# Patient Record
Sex: Female | Born: 1980
Health system: Southern US, Community
[De-identification: ages and names within clinical notes are randomized; demographics above are authoritative.]

## PROBLEM LIST (undated history)

## (undated) DIAGNOSIS — E7800 Pure hypercholesterolemia, unspecified: Secondary | ICD-10-CM

## (undated) DIAGNOSIS — I10 Essential (primary) hypertension: Secondary | ICD-10-CM

## (undated) DIAGNOSIS — F32A Depression, unspecified: Secondary | ICD-10-CM

## (undated) DIAGNOSIS — N751 Abscess of Bartholin's gland: Secondary | ICD-10-CM

## (undated) DIAGNOSIS — K859 Acute pancreatitis without necrosis or infection, unspecified: Secondary | ICD-10-CM

## (undated) HISTORY — PX: KNEE SURGERY: SHX244

## (undated) HISTORY — DX: Abscess of Bartholin's gland: N75.1

## (undated) HISTORY — DX: Depression, unspecified: F32.A

---

## 2007-08-30 ENCOUNTER — Ambulatory Visit: Payer: Self-pay | Admitting: Family Medicine

## 2007-08-30 LAB — CONVERTED CEMR LAB
AST: 21 units/L (ref 0–37)
Albumin: 4.8 g/dL (ref 3.5–5.2)
Alkaline Phosphatase: 52 units/L (ref 39–117)
Chloride: 103 meq/L (ref 96–112)
Eosinophils Absolute: 0.1 10*3/uL (ref 0.0–0.7)
Glucose, Bld: 173 mg/dL — ABNORMAL HIGH (ref 70–99)
LDL Cholesterol: 82 mg/dL (ref 0–99)
Lymphs Abs: 2.8 10*3/uL (ref 0.7–3.3)
MCV: 86.3 fL (ref 78.0–100.0)
Microalb, Ur: 35.1 mg/dL — ABNORMAL HIGH (ref 0.00–1.89)
Neutro Abs: 6.6 10*3/uL (ref 1.7–7.7)
Neutrophils Relative %: 66 % (ref 43–77)
Platelets: 300 10*3/uL (ref 150–400)
Potassium: 4.3 meq/L (ref 3.5–5.3)
Sodium: 142 meq/L (ref 135–145)
Total Protein: 7.9 g/dL (ref 6.0–8.3)
Triglycerides: 400 mg/dL — ABNORMAL HIGH (ref <150)
WBC: 10 10*3/uL (ref 4.0–10.5)

## 2007-09-27 ENCOUNTER — Ambulatory Visit: Payer: Self-pay | Admitting: *Deleted

## 2007-11-27 ENCOUNTER — Ambulatory Visit: Payer: Self-pay | Admitting: Internal Medicine

## 2007-11-27 ENCOUNTER — Encounter (INDEPENDENT_AMBULATORY_CARE_PROVIDER_SITE_OTHER): Payer: Self-pay | Admitting: Family Medicine

## 2007-11-27 LAB — CONVERTED CEMR LAB
HDL: 33 mg/dL — ABNORMAL LOW (ref >39)
Triglycerides: 700 mg/dL — ABNORMAL HIGH (ref <150)

## 2007-12-03 ENCOUNTER — Ambulatory Visit: Payer: Self-pay | Admitting: Family Medicine

## 2008-01-14 ENCOUNTER — Encounter (INDEPENDENT_AMBULATORY_CARE_PROVIDER_SITE_OTHER): Payer: Self-pay | Admitting: Family Medicine

## 2008-01-14 ENCOUNTER — Ambulatory Visit: Payer: Self-pay | Admitting: Internal Medicine

## 2008-01-14 LAB — CONVERTED CEMR LAB
Total CHOL/HDL Ratio: 6.2
VLDL: 71 mg/dL — ABNORMAL HIGH (ref 0–40)

## 2008-04-01 ENCOUNTER — Ambulatory Visit: Payer: Self-pay | Admitting: Family Medicine

## 2008-05-27 ENCOUNTER — Ambulatory Visit: Payer: Self-pay | Admitting: Family Medicine

## 2008-05-27 LAB — CONVERTED CEMR LAB
AST: 17 units/L (ref 0–37)
Albumin: 4.4 g/dL (ref 3.5–5.2)
Alkaline Phosphatase: 51 units/L (ref 39–117)
Potassium: 4.5 meq/L (ref 3.5–5.3)
Sodium: 142 meq/L (ref 135–145)
Total Protein: 7.6 g/dL (ref 6.0–8.3)

## 2008-07-24 ENCOUNTER — Ambulatory Visit: Payer: Self-pay | Admitting: Family Medicine

## 2008-07-24 LAB — CONVERTED CEMR LAB
ALT: 15 units/L (ref 0–35)
CO2: 21 meq/L (ref 19–32)
Calcium: 9.1 mg/dL (ref 8.4–10.5)
Chloride: 106 meq/L (ref 96–112)
Cholesterol: 201 mg/dL — ABNORMAL HIGH (ref 0–200)
Sodium: 140 meq/L (ref 135–145)
Total Bilirubin: 0.3 mg/dL (ref 0.3–1.2)
Total Protein: 6.9 g/dL (ref 6.0–8.3)

## 2008-12-15 ENCOUNTER — Ambulatory Visit: Payer: Self-pay | Admitting: Internal Medicine

## 2008-12-15 ENCOUNTER — Encounter (INDEPENDENT_AMBULATORY_CARE_PROVIDER_SITE_OTHER): Payer: Self-pay | Admitting: Family Medicine

## 2008-12-15 LAB — CONVERTED CEMR LAB
ALT: 35 units/L (ref 0–35)
AST: 30 units/L (ref 0–37)
Albumin: 4.5 g/dL (ref 3.5–5.2)
BUN: 12 mg/dL (ref 6–23)
Calcium: 9.7 mg/dL (ref 8.4–10.5)
Chloride: 101 meq/L (ref 96–112)
HDL: 31 mg/dL — ABNORMAL LOW (ref >39)
Potassium: 4.5 meq/L (ref 3.5–5.3)
Total Protein: 7.7 g/dL (ref 6.0–8.3)

## 2009-02-17 ENCOUNTER — Encounter (INDEPENDENT_AMBULATORY_CARE_PROVIDER_SITE_OTHER): Payer: Self-pay | Admitting: Family Medicine

## 2009-02-17 ENCOUNTER — Ambulatory Visit: Payer: Self-pay | Admitting: Internal Medicine

## 2009-02-17 LAB — CONVERTED CEMR LAB: Microalb, Ur: 67.37 mg/dL — ABNORMAL HIGH (ref 0.00–1.89)

## 2009-02-18 ENCOUNTER — Encounter (INDEPENDENT_AMBULATORY_CARE_PROVIDER_SITE_OTHER): Payer: Self-pay | Admitting: Family Medicine

## 2009-03-24 ENCOUNTER — Ambulatory Visit: Payer: Self-pay | Admitting: Family Medicine

## 2009-04-16 ENCOUNTER — Ambulatory Visit: Payer: Self-pay | Admitting: Family Medicine

## 2009-05-04 ENCOUNTER — Encounter: Payer: Self-pay | Admitting: Internal Medicine

## 2009-05-04 ENCOUNTER — Ambulatory Visit: Payer: Self-pay | Admitting: Internal Medicine

## 2009-05-04 DIAGNOSIS — S61209A Unspecified open wound of unspecified finger without damage to nail, initial encounter: Secondary | ICD-10-CM | POA: Insufficient documentation

## 2009-05-04 LAB — CONVERTED CEMR LAB: Blood Glucose, Fingerstick: 218

## 2009-05-06 ENCOUNTER — Ambulatory Visit: Payer: Self-pay | Admitting: Internal Medicine

## 2009-10-19 ENCOUNTER — Ambulatory Visit: Payer: Self-pay | Admitting: Internal Medicine

## 2009-11-03 ENCOUNTER — Ambulatory Visit: Payer: Self-pay | Admitting: Internal Medicine

## 2009-12-24 ENCOUNTER — Ambulatory Visit: Payer: Self-pay | Admitting: Family Medicine

## 2009-12-24 LAB — CONVERTED CEMR LAB
Cholesterol: 281 mg/dL — ABNORMAL HIGH (ref 0–200)
HDL: 30 mg/dL — ABNORMAL LOW (ref >39)
Triglycerides: 1682 mg/dL — ABNORMAL HIGH (ref <150)

## 2010-01-01 ENCOUNTER — Encounter (INDEPENDENT_AMBULATORY_CARE_PROVIDER_SITE_OTHER): Payer: Self-pay | Admitting: *Deleted

## 2010-12-28 NOTE — Letter (Signed)
Summary: Generic Letter  [facility]  [address]   [place], [place] [address]   Phone: [phone]  Fax: [phone]    01/01/2010  [facility] [address] [place], [place]  [address]  Dear [name],   We have been unable to contact you by phone. Please call our office to schedule an office visit with Dr. Audria Nine to follow up on your blood pressure.        Sincerely,   Gaylyn Cheers RN

## 2011-03-09 ENCOUNTER — Emergency Department: Payer: Self-pay | Admitting: Unknown Physician Specialty

## 2011-04-02 ENCOUNTER — Inpatient Hospital Stay: Payer: Self-pay | Admitting: Internal Medicine

## 2012-03-15 ENCOUNTER — Encounter (HOSPITAL_COMMUNITY): Payer: Self-pay | Admitting: *Deleted

## 2012-03-15 ENCOUNTER — Emergency Department (HOSPITAL_COMMUNITY)
Admission: EM | Admit: 2012-03-15 | Discharge: 2012-03-15 | Disposition: A | Discharge status: Home / Self Care | Payer: Self-pay | Attending: Emergency Medicine | Admitting: Emergency Medicine

## 2012-03-15 DIAGNOSIS — H6090 Unspecified otitis externa, unspecified ear: Secondary | ICD-10-CM

## 2012-03-15 DIAGNOSIS — I10 Essential (primary) hypertension: Secondary | ICD-10-CM | POA: Insufficient documentation

## 2012-03-15 DIAGNOSIS — R000 Tachycardia, unspecified: Secondary | ICD-10-CM | POA: Insufficient documentation

## 2012-03-15 DIAGNOSIS — Z87891 Personal history of nicotine dependence: Secondary | ICD-10-CM | POA: Insufficient documentation

## 2012-03-15 DIAGNOSIS — E119 Type 2 diabetes mellitus without complications: Secondary | ICD-10-CM | POA: Insufficient documentation

## 2012-03-15 DIAGNOSIS — H60399 Other infective otitis externa, unspecified ear: Secondary | ICD-10-CM | POA: Insufficient documentation

## 2012-03-15 HISTORY — DX: Pure hypercholesterolemia, unspecified: E78.00

## 2012-03-15 HISTORY — DX: Essential (primary) hypertension: I10

## 2012-03-15 MED ORDER — OXYCODONE-ACETAMINOPHEN 5-325 MG PO TABS
1.0000 | ORAL_TABLET | Freq: Once | ORAL | Status: AC
  Administered 2012-03-15: 1 via ORAL
  Filled 2012-03-15: qty 1

## 2012-03-15 MED ORDER — OXYCODONE-ACETAMINOPHEN 5-325 MG PO TABS: 1.0000 | ORAL_TABLET | ORAL | Status: AC | PRN

## 2012-03-15 MED ORDER — NEOMYCIN-POLYMYXIN-HC 3.5-10000-1 OT SOLN
3.0000 [drp] | Freq: Four times a day (QID) | OTIC | Status: DC
  Administered 2012-03-15: 3 [drp] via OTIC
  Filled 2012-03-15: qty 10

## 2012-03-15 NOTE — ED Provider Notes (Signed)
History     CSN: [id number]  Arrival date & time 03/15/12  1635   First MD Initiated Contact with Patient 03/15/12 1653      Chief Complaint  Patient presents with  . Otalgia    (Consider location/radiation/quality/duration/timing/severity/associated sxs/prior treatment) Patient is a 31 y.o. female presenting with ear pain. The history is provided by the patient.  Otalgia This is a new problem. The current episode started more than 2 days ago. There is pain in the right ear. The problem occurs constantly. The problem has been gradually worsening. There has been no fever. The pain is moderate. Associated symptoms include hearing loss. Pertinent negatives include no ear discharge and no cough. Associated symptoms comments: She was seen at [facility] for right ear infection 4 days ago and has been taking oral Bactrim without improvement. No fever. She denies drainage from the ear. She reports muffled hearing..    Past Medical History  Diagnosis Date  . Diabetes mellitus   . Hypertension   . Hypercholesteremia     History reviewed. No pertinent past surgical history.  Family History  Problem Relation Age of Onset  . Cancer Other   . Diabetes Other     History  Substance Use Topics  . Smoking status: Former Games developer  . Smokeless tobacco: Not on file  . Alcohol Use: No    OB History    Grav Para Term Preterm Abortions TAB SAB Ect Mult Living                  Review of Systems  Constitutional: Negative.  Negative for fever.  HENT: Positive for hearing loss and ear pain. Negative for ear discharge.   Eyes: Negative.   Respiratory: Negative.  Negative for cough.   Neurological: Negative.  Negative for dizziness.    Allergies  Review of patient's allergies indicates no known allergies.  Home Medications   Current Outpatient Rx  Name Route Sig Dispense Refill  . ACETAMINOPHEN 500 MG PO TABS Oral Take 1,000-1,500 mg by mouth every 6 (six) hours as needed. For pain.     Marland Kitchen FENOFIBRATE 145 MG PO TABS Oral Take 145 mg by mouth daily.    Marland Kitchen GLIPIZIDE 5 MG PO TABS Oral Take 5 mg by mouth 2 (two) times daily before a meal.    . INSULIN GLARGINE 100 UNIT/ML [place] SOLN Subcutaneous Inject 30 Units into the skin 2 (two) times daily.    Marland Kitchen LISINOPRIL 10 MG PO TABS Oral Take 10 mg by mouth daily.    Marland Kitchen METFORMIN HCL 1000 MG PO TABS Oral Take 1,000 mg by mouth 2 (two) times daily with a meal.    . PRAVASTATIN SODIUM 10 MG PO TABS Oral Take 10 mg by mouth daily.    . SULFAMETHOXAZOLE-TMP DS 800-160 MG PO TABS Oral Take 1 tablet by mouth 2 (two) times daily.      BP 140/68  Pulse 118  Temp(Src) 98.1 F (36.7 C) (Oral)  Resp 26  SpO2 98%  LMP 02/16/2012  Physical Exam  Constitutional: She appears well-developed and well-nourished.  HENT:  Head: Normocephalic.  Left Ear: External ear normal.       Right external canal markedly swollen, erythematous. No active drainage or crusting. Pre-auricular adenopathy. TM obscured.  Eyes: Conjunctivae are normal.  Neck: Normal range of motion. Neck supple.  Cardiovascular: Regular rhythm.        Tachycardia.  Pulmonary/Chest: Effort normal.  Musculoskeletal: Normal range of motion.  Neurological: She is  alert. No cranial nerve deficit.  Skin: Skin is warm and dry. No rash noted.  Psychiatric: She has a normal mood and affect.    ED Course  Procedures (including critical care time)  Labs Reviewed - No data to display No results found.   No diagnosis found.  1. Otitis externa, right  MDM  Ear wick inserted, treated with Cortisporin Otic. Given pain medication. Will refer to ENT for recheck if symptoms do not improve in 2-3 days.        Rodena Medin, PA-C 03/15/12 [phone]

## 2012-03-15 NOTE — ED Provider Notes (Signed)
Medical screening examination/treatment/procedure(s) were performed by non-physician practitioner and as supervising physician I was immediately available for consultation/collaboration.  Yanette Tripoli, MD 03/15/12 2005 

## 2012-03-15 NOTE — ED Notes (Signed)
Right ear pain x 2 weeks.  Just saw doctor this past Friday and was prescribed Trimetho/sulfamethox.  Pt sleeps with ice pack on her right side d/t pain and swelling but it only "makes the headahes go away."  Pt reports that the pain in her ear is so bad now that she cannot hear anything out of her right ear.  She thinks her pain is associated with her ear infections which she claims to get often.  The pain is worse this week with an achy feeling.  The swelling goes down to her jaw.  Pt says she "can't do anything" and thought her symptoms would improve once the antibiotics started working.

## 2012-03-15 NOTE — Discharge Instructions (Signed)
CONTINUE SEPTRA DS UNTIL GONE. USE 2-3 EAR DROPS THREE TIMES DAILY FOR THE NEXT 7 DAYS. IF SYMPTOMS DO NOT IMPROVE IN 2 DAYS, CALL DR. GORE TO SCHEDULE AN APPOINTMENT FOR FURTHER EXAMINATION. RETURN HERE AS NEEDED.  Otitis Externa Swimmer's ear (otitis externa) is an infection in the outer ear canal. It can be caused by a germ or a fungus. It may be caused by:  Swimming in dirty water.   Water that stays in the ear after swimming.  HOME CARE  Put drops in the ear canal as told by your doctor.   Only take medicine as told by your doctor.  GET HELP RIGHT AWAY IF:   You have a temperature by mouth above 102 F (38.9 C), not controlled by medicine.   There is ear pain after 3 days.  MAKE SURE YOU:   Understand these instructions.   Will watch this condition.   Will get help right away if you are not doing well or get worse.  Document Released: 05/02/2008 Document Revised: 11/03/2011 Document Reviewed: 05/02/2008 [facility] Patient Information 2012 [place], [place].

## 2012-08-29 ENCOUNTER — Emergency Department: Payer: Self-pay | Admitting: Unknown Physician Specialty

## 2012-09-09 ENCOUNTER — Inpatient Hospital Stay: Payer: Self-pay | Admitting: Internal Medicine

## 2012-09-09 LAB — URINALYSIS, COMPLETE
Bilirubin,UR: NEGATIVE
Glucose,UR: >=500 mg/dL (ref 0–75)
Nitrite: NEGATIVE
Ph: 5 (ref 4.5–8.0)
Specific Gravity: 1.037 (ref 1.003–1.030)
Squamous Epithelial: 9

## 2012-09-09 LAB — COMPREHENSIVE METABOLIC PANEL
Anion Gap: 14 (ref 7–16)
BUN: 13 mg/dL (ref 7–18)
Bilirubin,Total: 0.4 mg/dL (ref 0.2–1.0)
Chloride: 89 mmol/L — ABNORMAL LOW (ref 98–107)
Osmolality: 287 (ref 275–301)
Potassium: 4.3 mmol/L (ref 3.5–5.1)
SGOT(AST): 13 U/L — ABNORMAL LOW (ref 15–37)
Sodium: 124 mmol/L — ABNORMAL LOW (ref 136–145)

## 2012-09-09 LAB — CBC WITH DIFFERENTIAL/PLATELET
Basophil #: 0.2 10*3/uL — ABNORMAL HIGH (ref 0.0–0.1)
HCT: 41.6 % (ref 35.0–47.0)
HGB: 14.3 g/dL (ref 12.0–16.0)
Lymphocyte %: 12.2 %
Monocyte %: 4 %
RBC: 5.01 10*6/uL (ref 3.80–5.20)
RDW: 12.9 % (ref 11.5–14.5)
WBC: 20 10*3/uL — ABNORMAL HIGH (ref 3.6–11.0)

## 2012-09-10 LAB — CBC WITH DIFFERENTIAL/PLATELET
Basophil %: 0.5 %
Eosinophil #: 0 10*3/uL (ref 0.0–0.7)
Eosinophil %: 0.1 %
HCT: 37.8 % (ref 35.0–47.0)
HGB: 13.8 g/dL (ref 12.0–16.0)
Lymphocyte %: 10.9 %
MCHC: 36.6 g/dL — ABNORMAL HIGH (ref 32.0–36.0)
Monocyte %: 4.1 %
Neutrophil #: 13.2 10*3/uL — ABNORMAL HIGH (ref 1.4–6.5)
Neutrophil %: 84.4 %
RBC: 4.51 10*6/uL (ref 3.80–5.20)

## 2012-09-10 LAB — LIPID PANEL
Cholesterol: 273 mg/dL — ABNORMAL HIGH (ref 0–200)
HDL Cholesterol: 27 mg/dL — ABNORMAL LOW (ref 40–60)
Triglycerides: 2241 mg/dL — ABNORMAL HIGH (ref 0–200)

## 2012-09-10 LAB — BASIC METABOLIC PANEL
BUN: 4 mg/dL — ABNORMAL LOW (ref 7–18)
Calcium, Total: 6.9 mg/dL — CL (ref 8.5–10.1)
EGFR (African American): >60
EGFR (Non-African Amer.): >60
Glucose: 267 mg/dL — ABNORMAL HIGH (ref 65–99)
Osmolality: 278 (ref 275–301)
Sodium: 136 mmol/L (ref 136–145)

## 2012-09-10 LAB — MAGNESIUM: Magnesium: 1.6 mg/dL — ABNORMAL LOW

## 2012-09-11 LAB — CBC WITH DIFFERENTIAL/PLATELET
Basophil %: 0.5 %
Eosinophil #: 0.1 10*3/uL (ref 0.0–0.7)
Eosinophil %: 0.8 %
Lymphocyte #: 1.5 10*3/uL (ref 1.0–3.6)
Lymphocyte %: 11.4 %
Monocyte #: 0.5 x10 3/mm (ref 0.2–0.9)
Monocyte %: 4.1 %
Neutrophil %: 83.2 %
Platelet: 242 10*3/uL (ref 150–440)
RDW: 13.6 % (ref 11.5–14.5)
WBC: 13.2 10*3/uL — ABNORMAL HIGH (ref 3.6–11.0)

## 2012-09-12 LAB — CBC WITH DIFFERENTIAL/PLATELET
Basophil %: 0.5 %
Eosinophil %: 1.8 %
HCT: 32.4 % — ABNORMAL LOW (ref 35.0–47.0)
HGB: 10.8 g/dL — ABNORMAL LOW (ref 12.0–16.0)
Lymphocyte %: 17.3 %
MCH: 28.5 pg (ref 26.0–34.0)
MCHC: 33.4 g/dL (ref 32.0–36.0)
Monocyte #: 0.7 x10 3/mm (ref 0.2–0.9)
Neutrophil #: 8.4 10*3/uL — ABNORMAL HIGH (ref 1.4–6.5)
RBC: 3.8 10*6/uL (ref 3.80–5.20)

## 2013-09-10 LAB — COMPREHENSIVE METABOLIC PANEL
Bilirubin,Total: 0.5 mg/dL (ref 0.2–1.0)
Calcium, Total: 9.1 mg/dL (ref 8.5–10.1)
Chloride: 102 mmol/L (ref 98–107)
Co2: 22 mmol/L (ref 21–32)
EGFR (African American): >60
EGFR (Non-African Amer.): >60
Osmolality: 277 (ref 275–301)
SGPT (ALT): 29 U/L (ref 12–78)
Total Protein: 7.3 g/dL (ref 6.4–8.2)

## 2013-09-10 LAB — CBC
HCT: 39.3 % (ref 35.0–47.0)
Platelet: 575 10*3/uL — ABNORMAL HIGH (ref 150–440)
RBC: 4.74 10*6/uL (ref 3.80–5.20)
RDW: 13.6 % (ref 11.5–14.5)

## 2013-09-10 LAB — URINALYSIS, COMPLETE
Granular Cast: 32
Nitrite: NEGATIVE
Protein: >=500
Specific Gravity: 1.027 (ref 1.003–1.030)
Squamous Epithelial: 1

## 2013-09-10 LAB — LIPASE, BLOOD: Lipase: 1373 U/L — ABNORMAL HIGH (ref 73–393)

## 2013-09-11 ENCOUNTER — Inpatient Hospital Stay: Payer: Self-pay | Admitting: Family Medicine

## 2013-09-11 LAB — TRIGLYCERIDES: Triglycerides: 975 mg/dL — ABNORMAL HIGH (ref 0–200)

## 2013-09-11 LAB — LIPID PANEL
Cholesterol: 512 mg/dL — ABNORMAL HIGH (ref 0–200)
Triglycerides: >4000 mg/dL — ABNORMAL HIGH (ref 0–200)

## 2013-09-12 LAB — CBC WITH DIFFERENTIAL/PLATELET
Basophil %: 3.5 %
Lymphocyte #: 0.7 10*3/uL — ABNORMAL LOW (ref 1.0–3.6)
Lymphocyte %: 5.3 %
MCH: 30.3 pg (ref 26.0–34.0)
MCV: 85 fL (ref 80–100)
Neutrophil #: 11.3 10*3/uL — ABNORMAL HIGH (ref 1.4–6.5)
RDW: 14.1 % (ref 11.5–14.5)
WBC: 13.1 10*3/uL — ABNORMAL HIGH (ref 3.6–11.0)

## 2013-09-12 LAB — BASIC METABOLIC PANEL
Chloride: 101 mmol/L (ref 98–107)
EGFR (African American): >60
EGFR (Non-African Amer.): >60
Potassium: 3.1 mmol/L — ABNORMAL LOW (ref 3.5–5.1)
Sodium: 134 mmol/L — ABNORMAL LOW (ref 136–145)

## 2013-09-12 LAB — LIPASE, BLOOD: Lipase: 225 U/L (ref 73–393)

## 2013-09-12 LAB — TRIGLYCERIDES: Triglycerides: 1703 mg/dL — ABNORMAL HIGH (ref 0–200)

## 2013-09-13 LAB — BASIC METABOLIC PANEL
Anion Gap: 8 (ref 7–16)
Chloride: 105 mmol/L (ref 98–107)
EGFR (African American): >60
Glucose: 94 mg/dL (ref 65–99)
Potassium: 2.8 mmol/L — ABNORMAL LOW (ref 3.5–5.1)

## 2013-09-13 LAB — CBC WITH DIFFERENTIAL/PLATELET
Basophil #: 0.1 10*3/uL (ref 0.0–0.1)
Basophil %: 0.5 %
Eosinophil #: 0.1 10*3/uL (ref 0.0–0.7)
Eosinophil %: 0.7 %
HCT: 29.5 % — ABNORMAL LOW (ref 35.0–47.0)
HGB: 10.3 g/dL — ABNORMAL LOW (ref 12.0–16.0)
MCH: 29.7 pg (ref 26.0–34.0)
Monocyte #: 0.7 x10 3/mm (ref 0.2–0.9)
Monocyte %: 5.8 %
Neutrophil #: 9.8 10*3/uL — ABNORMAL HIGH (ref 1.4–6.5)
Neutrophil %: 78.8 %
RBC: 3.47 10*6/uL — ABNORMAL LOW (ref 3.80–5.20)
WBC: 12.5 10*3/uL — ABNORMAL HIGH (ref 3.6–11.0)

## 2013-09-14 LAB — BASIC METABOLIC PANEL
BUN: 3 mg/dL — ABNORMAL LOW (ref 7–18)
Calcium, Total: 8.3 mg/dL — ABNORMAL LOW (ref 8.5–10.1)
Chloride: 106 mmol/L (ref 98–107)
Co2: 26 mmol/L (ref 21–32)
Creatinine: 0.39 mg/dL — ABNORMAL LOW (ref 0.60–1.30)
EGFR (African American): >60
EGFR (Non-African Amer.): >60
Osmolality: 272 (ref 275–301)
Sodium: 138 mmol/L (ref 136–145)

## 2013-09-14 LAB — TRIGLYCERIDES: Triglycerides: 777 mg/dL — ABNORMAL HIGH (ref 0–200)

## 2013-09-15 ENCOUNTER — Emergency Department: Payer: Self-pay | Admitting: Internal Medicine

## 2013-09-15 LAB — COMPREHENSIVE METABOLIC PANEL
BUN: 3 mg/dL — ABNORMAL LOW (ref 7–18)
Chloride: 104 mmol/L (ref 98–107)
Co2: 26 mmol/L (ref 21–32)
EGFR (African American): >60
EGFR (Non-African Amer.): >60
Glucose: 123 mg/dL — ABNORMAL HIGH (ref 65–99)
Osmolality: 275 (ref 275–301)
Potassium: 3.3 mmol/L — ABNORMAL LOW (ref 3.5–5.1)
SGOT(AST): 25 U/L (ref 15–37)

## 2013-09-15 LAB — DRUG SCREEN, URINE
Amphetamines, Ur Screen: NEGATIVE (ref ?–1000)
Barbiturates, Ur Screen: NEGATIVE (ref ?–200)
Methadone, Ur Screen: NEGATIVE (ref ?–300)
Tricyclic, Ur Screen: NEGATIVE (ref ?–1000)

## 2013-09-15 LAB — CBC
HCT: 31.5 % — ABNORMAL LOW (ref 35.0–47.0)
HGB: 11.2 g/dL — ABNORMAL LOW (ref 12.0–16.0)
MCHC: 35.5 g/dL (ref 32.0–36.0)
MCV: 84 fL (ref 80–100)
Platelet: 306 10*3/uL (ref 150–440)
RDW: 13.7 % (ref 11.5–14.5)

## 2013-09-15 LAB — URINALYSIS, COMPLETE
Bacteria: NONE SEEN
Glucose,UR: NEGATIVE mg/dL (ref 0–75)
Ph: 6 (ref 4.5–8.0)
RBC,UR: 14 /HPF (ref 0–5)
Specific Gravity: 1.013 (ref 1.003–1.030)
Squamous Epithelial: 6
WBC UR: 7 /HPF (ref 0–5)

## 2013-09-15 LAB — LIPASE, BLOOD: Lipase: 117 U/L (ref 73–393)

## 2014-10-09 ENCOUNTER — Inpatient Hospital Stay: Payer: Self-pay | Admitting: Internal Medicine

## 2014-10-09 LAB — COMPREHENSIVE METABOLIC PANEL
ALBUMIN: 2.2 g/dL — AB (ref 3.4–5.0)
ANION GAP: 12 (ref 7–16)
Albumin: 2.1 g/dL — ABNORMAL LOW (ref 3.4–5.0)
Anion Gap: 10 (ref 7–16)
BILIRUBIN TOTAL: 1.8 mg/dL — AB (ref 0.2–1.0)
Bilirubin,Total: 1.5 mg/dL — ABNORMAL HIGH (ref 0.2–1.0)
CO2: 22 mmol/L (ref 21–32)
Chloride: 89 mmol/L — ABNORMAL LOW (ref 98–107)
Chloride: 93 mmol/L — ABNORMAL LOW (ref 98–107)
Co2: 20 mmol/L — ABNORMAL LOW (ref 21–32)
GLUCOSE: 574 mg/dL — AB (ref 65–99)
GLUCOSE: 465 mg/dL — AB (ref 65–99)
POTASSIUM: 4.4 mmol/L (ref 3.5–5.1)
POTASSIUM: 3.7 mmol/L (ref 3.5–5.1)
Sodium: 123 mmol/L — ABNORMAL LOW (ref 136–145)
Sodium: 123 mmol/L — ABNORMAL LOW (ref 136–145)
TOTAL PROTEIN: 8.1 g/dL (ref 6.4–8.2)

## 2014-10-09 LAB — URINALYSIS, COMPLETE
BILIRUBIN, UR: NEGATIVE
Glucose,UR: >=500 mg/dL (ref 0–75)
Nitrite: NEGATIVE
Ph: 5 (ref 4.5–8.0)
RBC,UR: 24 /HPF (ref 0–5)
Specific Gravity: 1.036 (ref 1.003–1.030)
Squamous Epithelial: 5
WBC UR: 31 /HPF (ref 0–5)

## 2014-10-09 LAB — CBC WITH DIFFERENTIAL/PLATELET
Basophil #: 0.1 10*3/uL (ref 0.0–0.1)
Basophil %: 0.8 %
EOS PCT: 2.2 %
Eosinophil #: 0.4 10*3/uL (ref 0.0–0.7)
HCT: 38.9 % (ref 35.0–47.0)
HGB: 14.2 g/dL (ref 12.0–16.0)
Lymphocyte #: 1.1 10*3/uL (ref 1.0–3.6)
Lymphocyte %: 6.2 %
MCH: 31.6 pg (ref 26.0–34.0)
MCHC: 36.5 g/dL — AB (ref 32.0–36.0)
MCV: 85 fL (ref 80–100)
Monocyte #: 1.8 x10 3/mm — ABNORMAL HIGH (ref 0.2–0.9)
Monocyte %: 9.4 %
NEUTROS ABS: 15.2 10*3/uL — AB (ref 1.4–6.5)
Neutrophil %: 81.4 %
Platelet: 428 10*3/uL (ref 150–440)
RBC: 4.5 10*6/uL (ref 3.80–5.20)
RDW: 13.3 % (ref 11.5–14.5)
WBC: 18.6 10*3/uL — ABNORMAL HIGH (ref 3.6–11.0)

## 2014-10-09 LAB — LIPASE, BLOOD: Lipase: 619 U/L — ABNORMAL HIGH (ref 73–393)

## 2014-10-09 LAB — BASIC METABOLIC PANEL
Anion Gap: 11 (ref 7–16)
Chloride: 96 mmol/L — ABNORMAL LOW (ref 98–107)
Co2: 22 mmol/L (ref 21–32)
Glucose: 405 mg/dL — ABNORMAL HIGH (ref 65–99)
POTASSIUM: 3.8 mmol/L (ref 3.5–5.1)
Sodium: 129 mmol/L — ABNORMAL LOW (ref 136–145)

## 2014-10-09 LAB — PREGNANCY, URINE: PREGNANCY TEST, URINE: NEGATIVE m[IU]/mL

## 2014-10-10 LAB — LIPID PANEL
Cholesterol: 333 mg/dL — ABNORMAL HIGH (ref 0–200)
HDL Cholesterol: 18 mg/dL — ABNORMAL LOW (ref 40–60)
Triglycerides: 2761 mg/dL — ABNORMAL HIGH (ref 0–200)

## 2014-10-10 LAB — CBC WITH DIFFERENTIAL/PLATELET
Basophil #: 0.1 10*3/uL
Basophil %: 0.7 %
Eosinophil #: 0.1 10*3/uL
Eosinophil %: 0.4 %
HCT: 34.5 % — ABNORMAL LOW
HGB: 12.7 g/dL
Lymphocyte %: 14.2 %
Lymphs Abs: 2.2 10*3/uL
MCH: 32 pg
MCHC: 36.9 g/dL — ABNORMAL HIGH
MCV: 87 fL
Monocyte #: 0.5 10*3/uL
Monocyte %: 3.4 %
Neutrophil #: 12.6 10*3/uL — ABNORMAL HIGH
Neutrophil %: 81.3 %
Platelet: 412 10*3/uL
RBC: 3.98 X10 6/mm 3
RDW: 13.8 %
WBC: 15.5 10*3/uL — ABNORMAL HIGH

## 2014-10-10 LAB — BASIC METABOLIC PANEL WITH GFR
Anion Gap: 10
BUN: 2 mg/dL — ABNORMAL LOW
Calcium, Total: 7.4 mg/dL — ABNORMAL LOW
Chloride: 98 mmol/L
Co2: 22 mmol/L
Creatinine: 0.35 mg/dL — ABNORMAL LOW
EGFR (African American): >60
EGFR (Non-African Amer.): >60
Glucose: 259 mg/dL — ABNORMAL HIGH
Osmolality: 266
Potassium: 3.6 mmol/L
Sodium: 130 mmol/L — ABNORMAL LOW

## 2014-10-10 LAB — HEMOGLOBIN A1C: Hemoglobin A1C: 10.4 % — ABNORMAL HIGH

## 2014-10-11 LAB — LIPASE, BLOOD: Lipase: 206 U/L (ref 73–393)

## 2014-10-11 LAB — COMPREHENSIVE METABOLIC PANEL
AST: 23 U/L (ref 15–37)
Albumin: 2.2 g/dL — ABNORMAL LOW (ref 3.4–5.0)
Alkaline Phosphatase: 61 U/L
Anion Gap: 9 (ref 7–16)
BUN: 3 mg/dL — AB (ref 7–18)
Bilirubin,Total: 0.5 mg/dL (ref 0.2–1.0)
CALCIUM: 8.2 mg/dL — AB (ref 8.5–10.1)
CO2: 23 mmol/L (ref 21–32)
CREATININE: 0.53 mg/dL — AB (ref 0.60–1.30)
Chloride: 99 mmol/L (ref 98–107)
Glucose: 225 mg/dL — ABNORMAL HIGH (ref 65–99)
Osmolality: 266 (ref 275–301)
Potassium: 4 mmol/L (ref 3.5–5.1)
SGPT (ALT): 28 U/L
Sodium: 131 mmol/L — ABNORMAL LOW (ref 136–145)
TOTAL PROTEIN: 6.9 g/dL (ref 6.4–8.2)

## 2014-10-11 LAB — URINE CULTURE

## 2014-10-12 LAB — LIPID PANEL
Cholesterol: 372 mg/dL — ABNORMAL HIGH (ref 0–200)
HDL Cholesterol: 22 mg/dL — ABNORMAL LOW (ref 40–60)
TRIGLYCERIDES: 866 mg/dL — AB (ref 0–200)

## 2014-10-13 LAB — LIPID PANEL
CHOLESTEROL: 344 mg/dL — AB (ref 0–200)
HDL: 17 mg/dL — AB (ref 40–60)
Triglycerides: 804 mg/dL — ABNORMAL HIGH (ref 0–200)

## 2014-10-21 ENCOUNTER — Ambulatory Visit: Payer: Self-pay | Attending: Internal Medicine | Admitting: Internal Medicine

## 2014-10-21 ENCOUNTER — Encounter: Payer: Self-pay | Admitting: Internal Medicine

## 2014-10-21 ENCOUNTER — Ambulatory Visit (HOSPITAL_BASED_OUTPATIENT_CLINIC_OR_DEPARTMENT_OTHER): Payer: Self-pay

## 2014-10-21 VITALS — BP 135/82 | HR 91 | Temp 98.3°F | Resp 16 | Ht 64.0 in | Wt 219.0 lb

## 2014-10-21 DIAGNOSIS — Z794 Long term (current) use of insulin: Secondary | ICD-10-CM | POA: Insufficient documentation

## 2014-10-21 DIAGNOSIS — E139 Other specified diabetes mellitus without complications: Secondary | ICD-10-CM

## 2014-10-21 DIAGNOSIS — K859 Acute pancreatitis, unspecified: Secondary | ICD-10-CM | POA: Insufficient documentation

## 2014-10-21 DIAGNOSIS — Z792 Long term (current) use of antibiotics: Secondary | ICD-10-CM | POA: Insufficient documentation

## 2014-10-21 DIAGNOSIS — Z23 Encounter for immunization: Secondary | ICD-10-CM

## 2014-10-21 DIAGNOSIS — Z139 Encounter for screening, unspecified: Secondary | ICD-10-CM

## 2014-10-21 DIAGNOSIS — Z87891 Personal history of nicotine dependence: Secondary | ICD-10-CM | POA: Insufficient documentation

## 2014-10-21 DIAGNOSIS — E780 Pure hypercholesterolemia: Secondary | ICD-10-CM | POA: Insufficient documentation

## 2014-10-21 DIAGNOSIS — I10 Essential (primary) hypertension: Secondary | ICD-10-CM | POA: Insufficient documentation

## 2014-10-21 DIAGNOSIS — E1165 Type 2 diabetes mellitus with hyperglycemia: Secondary | ICD-10-CM | POA: Insufficient documentation

## 2014-10-21 DIAGNOSIS — Z8719 Personal history of other diseases of the digestive system: Secondary | ICD-10-CM

## 2014-10-21 DIAGNOSIS — E781 Pure hyperglyceridemia: Secondary | ICD-10-CM | POA: Insufficient documentation

## 2014-10-21 DIAGNOSIS — E119 Type 2 diabetes mellitus without complications: Secondary | ICD-10-CM | POA: Insufficient documentation

## 2014-10-21 LAB — COMPLETE METABOLIC PANEL WITH GFR
ALT: 14 U/L (ref 0–35)
AST: 14 U/L (ref 0–37)
Albumin: 3.9 g/dL (ref 3.5–5.2)
Alkaline Phosphatase: 49 U/L (ref 39–117)
BILIRUBIN TOTAL: 0.3 mg/dL (ref 0.2–1.2)
BUN: 11 mg/dL (ref 6–23)
CO2: 25 meq/L (ref 19–32)
CREATININE: 0.45 mg/dL — AB (ref 0.50–1.10)
Calcium: 9.3 mg/dL (ref 8.4–10.5)
Chloride: 104 mEq/L (ref 96–112)
GFR, Est Non African American: >89 mL/min
Glucose, Bld: 117 mg/dL — ABNORMAL HIGH (ref 70–99)
Potassium: 4.3 mEq/L (ref 3.5–5.3)
SODIUM: 137 meq/L (ref 135–145)
TOTAL PROTEIN: 6.6 g/dL (ref 6.0–8.3)

## 2014-10-21 LAB — POCT GLYCOSYLATED HEMOGLOBIN (HGB A1C): HEMOGLOBIN A1C: 10

## 2014-10-21 LAB — LIPASE: LIPASE: 42 U/L (ref 0–75)

## 2014-10-21 LAB — GLUCOSE, POCT (MANUAL RESULT ENTRY): POC Glucose: 155 mg/dl — AB (ref 70–99)

## 2014-10-21 NOTE — Patient Instructions (Signed)
DASH Eating Plan DASH stands for "Dietary Approaches to Stop Hypertension." The DASH eating plan is a healthy eating plan that has been shown to reduce high blood pressure (hypertension). Additional health benefits may include reducing the risk of type 2 diabetes mellitus, heart disease, and stroke. The DASH eating plan may also help with weight loss. WHAT DO I NEED TO KNOW ABOUT THE DASH EATING PLAN? For the DASH eating plan, you will follow these general guidelines:  Choose foods with a percent daily value for sodium of less than 5% (as listed on the food label).  Use salt-free seasonings or herbs instead of table salt or sea salt.  Check with your health care provider or pharmacist before using salt substitutes.  Eat lower-sodium products, often labeled as "lower sodium" or "no salt added."  Eat fresh foods.  Eat more vegetables, fruits, and low-fat dairy products.  Choose whole grains. Look for the word "whole" as the first word in the ingredient list.  Choose fish and skinless chicken or turkey more often than red meat. Limit fish, poultry, and meat to 6 oz (170 g) each day.  Limit sweets, desserts, sugars, and sugary drinks.  Choose heart-healthy fats.  Limit cheese to 1 oz (28 g) per day.  Eat more home-cooked food and less restaurant, buffet, and fast food.  Limit fried foods.  Cook foods using methods other than frying.  Limit canned vegetables. If you do use them, rinse them well to decrease the sodium.  When eating at a restaurant, ask that your food be prepared with less salt, or no salt if possible. WHAT FOODS CAN I EAT? Seek help from a dietitian for individual calorie needs. Grains Whole grain or whole wheat bread. Brown rice. Whole grain or whole wheat pasta. Quinoa, bulgur, and whole grain cereals. Low-sodium cereals. Corn or whole wheat flour tortillas. Whole grain cornbread. Whole grain crackers. Low-sodium crackers. Vegetables Fresh or frozen vegetables  (raw, steamed, roasted, or grilled). Low-sodium or reduced-sodium tomato and vegetable juices. Low-sodium or reduced-sodium tomato sauce and paste. Low-sodium or reduced-sodium canned vegetables.  Fruits All fresh, canned (in natural juice), or frozen fruits. Meat and Other Protein Products Ground beef (85% or leaner), grass-fed beef, or beef trimmed of fat. Skinless chicken or turkey. Ground chicken or turkey. Pork trimmed of fat. All fish and seafood. Eggs. Dried beans, peas, or lentils. Unsalted nuts and seeds. Unsalted canned beans. Dairy Low-fat dairy products, such as skim or 1% milk, 2% or reduced-fat cheeses, low-fat ricotta or cottage cheese, or plain low-fat yogurt. Low-sodium or reduced-sodium cheeses. Fats and Oils Tub margarines without trans fats. Light or reduced-fat mayonnaise and salad dressings (reduced sodium). Avocado. Safflower, olive, or canola oils. Natural peanut or almond butter. Other Unsalted popcorn and pretzels. The items listed above may not be a complete list of recommended foods or beverages. Contact your dietitian for more options. WHAT FOODS ARE NOT RECOMMENDED? Grains White bread. White pasta. White rice. Refined cornbread. Bagels and croissants. Crackers that contain trans fat. Vegetables Creamed or fried vegetables. Vegetables in a cheese sauce. Regular canned vegetables. Regular canned tomato sauce and paste. Regular tomato and vegetable juices. Fruits Dried fruits. Canned fruit in light or heavy syrup. Fruit juice. Meat and Other Protein Products Fatty cuts of meat. Ribs, chicken wings, bacon, sausage, bologna, salami, chitterlings, fatback, hot dogs, bratwurst, and packaged luncheon meats. Salted nuts and seeds. Canned beans with salt. Dairy Whole or 2% milk, cream, half-and-half, and cream cheese. Whole-fat or sweetened yogurt. Full-fat   cheeses or blue cheese. Nondairy creamers and whipped toppings. Processed cheese, cheese spreads, or cheese  curds. Condiments Onion and garlic salt, seasoned salt, table salt, and sea salt. Canned and packaged gravies. Worcestershire sauce. Tartar sauce. Barbecue sauce. Teriyaki sauce. Soy sauce, including reduced sodium. Steak sauce. Fish sauce. Oyster sauce. Cocktail sauce. Horseradish. Ketchup and mustard. Meat flavorings and tenderizers. Bouillon cubes. Hot sauce. Tabasco sauce. Marinades. Taco seasonings. Relishes. Fats and Oils Butter, stick margarine, lard, shortening, ghee, and bacon fat. Coconut, palm kernel, or palm oils. Regular salad dressings. Other Pickles and olives. Salted popcorn and pretzels. The items listed above may not be a complete list of foods and beverages to avoid. Contact your dietitian for more information. WHERE CAN I FIND MORE INFORMATION? National Heart, Lung, and Blood Institute: www.nhlbi.nih.gov/health/health-topics/topics/dash/ Document Released: 11/03/2011 Document Revised: 03/31/2014 Document Reviewed: 09/18/2013 [employer] Patient Information 2015 [employer]. This information is not intended to replace advice given to you by your health care provider. Make sure you discuss any questions you have with your health care provider. Fat and Cholesterol Control Diet Fat and cholesterol levels in your blood and organs are influenced by your diet. High levels of fat and cholesterol may lead to diseases of the heart, small and large blood vessels, gallbladder, liver, and pancreas. CONTROLLING FAT AND CHOLESTEROL WITH DIET Although exercise and lifestyle factors are important, your diet is key. That is because certain foods are known to raise cholesterol and others to lower it. The goal is to balance foods for their effect on cholesterol and more importantly, to replace saturated and trans fat with other types of fat, such as monounsaturated fat, polyunsaturated fat, and omega-3 fatty acids. On average, a person should consume no more than 15 to 17 g of saturated fat daily.  Saturated and trans fats are considered "bad" fats, and they will raise LDL cholesterol. Saturated fats are primarily found in animal products such as meats, butter, and cream. However, that does not mean you need to give up all your favorite foods. Today, there are good tasting, low-fat, low-cholesterol substitutes for most of the things you like to eat. Choose low-fat or nonfat alternatives. Choose round or loin cuts of red meat. These types of cuts are lowest in fat and cholesterol. Chicken (without the skin), fish, veal, and ground turkey breast are great choices. Eliminate fatty meats, such as hot dogs and salami. Even shellfish have little or no saturated fat. Have a 3 oz (85 g) portion when you eat lean meat, poultry, or fish. Trans fats are also called "partially hydrogenated oils." They are oils that have been scientifically manipulated so that they are solid at room temperature resulting in a longer shelf life and improved taste and texture of foods in which they are added. Trans fats are found in stick margarine, some tub margarines, cookies, crackers, and baked goods.  When baking and cooking, oils are a great substitute for butter. The monounsaturated oils are especially beneficial since it is believed they lower LDL and raise HDL. The oils you should avoid entirely are saturated tropical oils, such as coconut and palm.  Remember to eat a lot from food groups that are naturally free of saturated and trans fat, including fish, fruit, vegetables, beans, grains (barley, rice, couscous, bulgur wheat), and pasta (without cream sauces).  IDENTIFYING FOODS THAT LOWER FAT AND CHOLESTEROL  Soluble fiber may lower your cholesterol. This type of fiber is found in fruits such as apples, vegetables such as broccoli, potatoes, and carrots,   legumes such as beans, peas, and lentils, and grains such as barley. Foods fortified with plant sterols (phytosterol) may also lower cholesterol. You should eat at least 2 g  per day of these foods for a cholesterol lowering effect.  Read package labels to identify low-saturated fats, trans fat free, and low-fat foods at the supermarket. Select cheeses that have only 2 to 3 g saturated fat per ounce. Use a heart-healthy tub margarine that is free of trans fats or partially hydrogenated oil. When buying baked goods (cookies, crackers), avoid partially hydrogenated oils. Breads and muffins should be made from whole grains (whole-wheat or whole oat flour, instead of "flour" or "enriched flour"). Buy non-creamy canned soups with reduced salt and no added fats.  FOOD PREPARATION TECHNIQUES  Never deep-fry. If you must fry, either stir-fry, which uses very little fat, or use non-stick cooking sprays. When possible, broil, bake, or roast meats, and steam vegetables. Instead of putting butter or margarine on vegetables, use lemon and herbs, applesauce, and cinnamon (for squash and sweet potatoes). Use nonfat yogurt, salsa, and low-fat dressings for salads.  LOW-SATURATED FAT / LOW-FAT FOOD SUBSTITUTES Meats / Saturated Fat (g)  Avoid: Steak, marbled (3 oz/85 g) / 11 g  Choose: Steak, lean (3 oz/85 g) / 4 g  Avoid: Hamburger (3 oz/85 g) / 7 g  Choose: Hamburger, lean (3 oz/85 g) / 5 g  Avoid: Ham (3 oz/85 g) / 6 g  Choose: Ham, lean cut (3 oz/85 g) / 2.4 g  Avoid: Chicken, with skin, dark meat (3 oz/85 g) / 4 g  Choose: Chicken, skin removed, dark meat (3 oz/85 g) / 2 g  Avoid: Chicken, with skin, light meat (3 oz/85 g) / 2.5 g  Choose: Chicken, skin removed, light meat (3 oz/85 g) / 1 g Dairy / Saturated Fat (g)  Avoid: Whole milk (1 cup) / 5 g  Choose: Low-fat milk, 2% (1 cup) / 3 g  Choose: Low-fat milk, 1% (1 cup) / 1.5 g  Choose: Skim milk (1 cup) / 0.3 g  Avoid: Hard cheese (1 oz/28 g) / 6 g  Choose: Skim milk cheese (1 oz/28 g) / 2 to 3 g  Avoid: Cottage cheese, 4% fat (1 cup) / 6.5 g  Choose: Low-fat cottage cheese, 1% fat (1 cup) / 1.5  g  Avoid: Ice cream (1 cup) / 9 g  Choose: Sherbet (1 cup) / 2.5 g  Choose: Nonfat frozen yogurt (1 cup) / 0.3 g  Choose: Frozen fruit bar / trace  Avoid: Whipped cream (1 tbs) / 3.5 g  Choose: Nondairy whipped topping (1 tbs) / 1 g Condiments / Saturated Fat (g)  Avoid: Mayonnaise (1 tbs) / 2 g  Choose: Low-fat mayonnaise (1 tbs) / 1 g  Avoid: Butter (1 tbs) / 7 g  Choose: Extra light margarine (1 tbs) / 1 g  Avoid: Coconut oil (1 tbs) / 11.8 g  Choose: Olive oil (1 tbs) / 1.8 g  Choose: Corn oil (1 tbs) / 1.7 g  Choose: Safflower oil (1 tbs) / 1.2 g  Choose: Sunflower oil (1 tbs) / 1.4 g  Choose: Soybean oil (1 tbs) / 2.4 g  Choose: Canola oil (1 tbs) / 1 g Document Released: 11/14/2005 Document Revised: 03/11/2013 Document Reviewed: 02/12/2014 Exit[employer] Patient Information 2015 [employer]. This information is not intended to replace advice given to you by your health care provider. Make sure you discuss any questions you have with your health care   provider. Diabetes Mellitus and Food It is important for you to manage your blood sugar (glucose) level. Your blood glucose level can be greatly affected by what you eat. Eating healthier foods in the appropriate amounts throughout the day at about the same time each day will help you control your blood glucose level. It can also help slow or prevent worsening of your diabetes mellitus. Healthy eating may even help you improve the level of your blood pressure and reach or maintain a healthy weight.  HOW CAN FOOD AFFECT ME? Carbohydrates Carbohydrates affect your blood glucose level more than any other type of food. Your dietitian will help you determine how many carbohydrates to eat at each meal and teach you how to count carbohydrates. Counting carbohydrates is important to keep your blood glucose at a healthy level, especially if you are using insulin or taking certain medicines for diabetes mellitus. Alcohol Alcohol can  cause sudden decreases in blood glucose (hypoglycemia), especially if you use insulin or take certain medicines for diabetes mellitus. Hypoglycemia can be a life-threatening condition. Symptoms of hypoglycemia (sleepiness, dizziness, and disorientation) are similar to symptoms of having too much alcohol.  If your health care provider has given you approval to drink alcohol, do so in moderation and use the following guidelines:  Women should not have more than one drink per day, and men should not have more than two drinks per day. One drink is equal to:  12 oz of beer.  5 oz of wine.  1 oz of hard liquor.  Do not drink on an empty stomach.  Keep yourself hydrated. Have water, diet soda, or unsweetened iced tea.  Regular soda, juice, and other mixers might contain a lot of carbohydrates and should be counted. WHAT FOODS ARE NOT RECOMMENDED? As you make food choices, it is important to remember that all foods are not the same. Some foods have fewer nutrients per serving than other foods, even though they might have the same number of calories or carbohydrates. It is difficult to get your body what it needs when you eat foods with fewer nutrients. Examples of foods that you should avoid that are high in calories and carbohydrates but low in nutrients include:  Trans fats (most processed foods list trans fats on the Nutrition Facts label).  Regular soda.  Juice.  Candy.  Sweets, such as cake, pie, doughnuts, and cookies.  Fried foods. WHAT FOODS CAN I EAT? Have nutrient-rich foods, which will nourish your body and keep you healthy. The food you should eat also will depend on several factors, including:  The calories you need.  The medicines you take.  Your weight.  Your blood glucose level.  Your blood pressure level.  Your cholesterol level. You also should eat a variety of foods, including:  Protein, such as meat, poultry, fish, tofu, nuts, and seeds (lean animal proteins  are best).  Fruits.  Vegetables.  Dairy products, such as milk, cheese, and yogurt (low fat is best).  Breads, grains, pasta, cereal, rice, and beans.  Fats such as olive oil, trans fat-free margarine, canola oil, avocado, and olives. DOES EVERYONE WITH DIABETES MELLITUS HAVE THE SAME MEAL PLAN? Because every person with diabetes mellitus is different, there is not one meal plan that works for everyone. It is very important that you meet with a dietitian who will help you create a meal plan that is just right for you. Document Released: 08/11/2005 Document Revised: 11/19/2013 Document Reviewed: 10/11/2013 Exit[employer] Patient Information 2015 [employer],   LL[employer]. This information is not intended to replace advice given to you by your health care provider. Make sure you discuss any questions you have with your health care provider.  

## 2014-10-21 NOTE — Progress Notes (Signed)
Patient is a Hospital follow up. She does not have any concerns today.   Patient denies pain.

## 2014-10-21 NOTE — Progress Notes (Signed)
Patient Demographics  [name], is a 33 y.o. female  ZOX:[id number]  WJX:[id number]  DOB - 01/21/1981  CC:  Chief Complaint  Patient presents with  . Hospitalization Follow-up       HPI: [name] is a 33 y.o. female here today to establish medical care.patient has history of diabetes hypertension hypertriglyceridemia, pancreatitis patient was recently hospitalized at [facility], patient was treated with IV fluids was diagnosed with pancreatitis secondary to elevated triglycerides, she brought the medical record to her last triglycerides were more than 800, she was advised to take TriCor and Lopid, she's also on Pravachol 40 mg daily, her diabetes is still uncontrolled today her hemoglobin A1c is 10.0%, she is taking Lantus 20 units twice a day, ?she was advised to take metformin 2 g twice a day, she's also on Glucotrol 10 mg daily and she was started on NovoLog sliding scale.patient was also treated for UTI, she currently denies any urinary symptoms denies any nausea vomiting. Patient has No headache, No chest pain, No abdominal pain - No Nausea, No new weakness tingling or numbness, No Cough - SOB.  No Known Allergies Past Medical History  Diagnosis Date  . Diabetes mellitus   . Hypertension   . Hypercholesteremia    Current Outpatient Prescriptions on File Prior to Visit  Medication Sig Dispense Refill  . acetaminophen (TYLENOL) 500 MG tablet Take 1,000-1,500 mg by mouth every 6 (six) hours as needed. For pain.    . fenofibrate (TRICOR) 145 MG tablet Take 145 mg by mouth daily.    Marland Kitchen. glipiZIDE (GLUCOTROL) 5 MG tablet Take 5 mg by mouth 2 (two) times daily before a meal.    . insulin glargine (LANTUS) 100 UNIT/ML injection Inject 25 Units into the skin 2 (two) times daily.    Marland Kitchen. lisinopril (PRINIVIL,ZESTRIL) 10 MG tablet Take 10 mg by mouth daily.    . metFORMIN (GLUCOPHAGE) 1000 MG tablet Take 1,000 mg by mouth 2 (two) times daily with a meal.      . pravastatin (PRAVACHOL) 10 MG tablet Take 40 mg by mouth daily.     Marland Kitchen. sulfamethoxazole-trimethoprim (BACTRIM DS) 800-160 MG per tablet Take 1 tablet by mouth 2 (two) times daily.     No current facility-administered medications on file prior to visit.   Family History  Problem Relation Age of Onset  . Cancer Other   . Diabetes Other   . Hypertension Other   . Cancer Paternal Grandfather    History   Social History  . Marital Status: Single    Spouse Name: N/A    Number of Children: N/A  . Years of Education: N/A   Occupational History  . Not on file.   Social History Main Topics  . Smoking status: Former Games developermoker  . Smokeless tobacco: Not on file  . Alcohol Use: No  . Drug Use: No  . Sexual Activity: No   Other Topics Concern  . Not on file   Social History Narrative    Review of Systems: Constitutional: Negative for fever, chills, diaphoresis, activity change, appetite change and fatigue. HENT: Negative for ear pain, nosebleeds, congestion, facial swelling, rhinorrhea, neck pain, neck stiffness and ear discharge.  Eyes: Negative for pain, discharge, redness, itching and visual disturbance. Respiratory: Negative for cough, choking, chest tightness, shortness of breath, wheezing and stridor.  Cardiovascular: Negative for chest pain, palpitations and leg swelling. Gastrointestinal: Negative for abdominal distention. Genitourinary: Negative for dysuria, urgency, frequency, hematuria, flank pain,  decreased urine volume, difficulty urinating and dyspareunia.  Musculoskeletal: Negative for back pain, joint swelling, arthralgia and gait problem. Neurological: Negative for dizziness, tremors, seizures, syncope, facial asymmetry, speech difficulty, weakness, light-headedness, numbness and headaches.  Hematological: Negative for adenopathy. Does not bruise/bleed easily. Psychiatric/Behavioral: Negative for hallucinations, behavioral problems, confusion, dysphoric mood,  decreased concentration and agitation.    Objective:   Filed Vitals:   10/21/14 0932  BP: 135/82  Pulse: 91  Temp: 98.3 F (36.8 C)  Resp: 16    Physical Exam: Constitutional: obese female sitting comfortably not in acute distress. HENT: Normocephalic, atraumatic, External right and left ear normal. Oropharynx is clear and moist.  Eyes: Conjunctivae and EOM are normal. PERRLA, no scleral icterus. Neck: Normal ROM. Neck supple. No JVD. No tracheal deviation. No thyromegaly. CVS: RRR, S1/S2 +, no murmurs, no gallops, no carotid bruit.  Pulmonary: Effort and breath sounds normal, no stridor, rhonchi, wheezes, rales.  Abdominal: Soft. BS +, no distension, tenderness, rebound or guarding.  Musculoskeletal: Normal range of motion. No edema and no tenderness.  Neuro: Alert. Normal reflexes, muscle tone coordination. No cranial nerve deficit. Skin: Skin is warm and dry. No rash noted. Not diaphoretic. No erythema. No pallor. Psychiatric: Normal mood and affect. Behavior, judgment, thought content normal.  Lab Results  Component Value Date   WBC 10.0 08/30/2007   HGB 16.3* 08/30/2007   HCT 46.8* 08/30/2007   MCV 86.3 08/30/2007   PLT 300 08/30/2007   Lab Results  Component Value Date   CREATININE 0.64 12/15/2008   BUN 12 12/15/2008   NA 136 12/15/2008   K 4.5 12/15/2008   CL 101 12/15/2008   CO2 21 12/15/2008    Lab Results  Component Value Date   HGBA1C 10.0 10/21/2014   Lipid Panel     Component Value Date/Time   CHOL 281* 12/24/2009 2134   TRIG 1682* 12/24/2009 2134   HDL 30* 12/24/2009 2134   CHOLHDL 9.4 Ratio 12/24/2009 2134   VLDL NOT CALC mg/dL 09/81/191401/27/2011 [id number]   LDLCALC NOT CALC mg/dL 56/21/308601/27/2011 [id number]       Assessment and plan:   1. Other specified diabetes mellitus without complications Results for orders placed or performed in visit on 10/21/14  Glucose (CBG)  Result Value Ref Range   POC Glucose 155 (A) 70 - 99 mg/dl  HgB O9GA1c  Result Value Ref  Range   Hemoglobin A1C 10.0    Diabetes is still uncontrolled, I have advised patient to increase the dose of Lantus to 25 units twice a day, continue with NovoLog sliding scale, continue with Glucotrol, she will take metformin 1 g twice a day, advise for diabetes meal planning, continue with fingerstick records monitoring, will repeat A1c in 3 months.  2. History of pancreatitis  - Lipase  3. Needs flu shot As per patient she has not received the flu shot this year.  4. Need for prophylactic vaccination against Streptococcus pneumoniae (pneumococcus) Pneumovax given today.  5. Essential hypertension Blood pressure is well controlled, currently on lisinopril milligram daily will check blood chemistry. - COMPLETE METABOLIC PANEL WITH GFR  6. Screening  - Vit D  25 hydroxy (rtn osteoporosis monitoring)   Hypertriglyceridemia    Currently patient is taking TriCor, Lopid, Pravachol. Her last triglycerides were more than 800, will repeat the level in 3 months.  Health Maintenance  -Vaccinations:  Flu shot and Pneumovax given today.  Return in about 3 months (around 01/21/2015) for hypertension, diabetes, hyperipidemia.   Eiman Maret,  MD

## 2014-10-22 ENCOUNTER — Telehealth: Payer: Self-pay | Admitting: *Deleted

## 2014-10-22 LAB — VITAMIN D 25 HYDROXY (VIT D DEFICIENCY, FRACTURES): Vit D, 25-Hydroxy: 6 ng/mL — ABNORMAL LOW (ref 30–100)

## 2014-10-22 MED ORDER — VITAMIN D (ERGOCALCIFEROL) 1.25 MG (50000 UNIT) PO CAPS: 50000.0000 [IU] | ORAL_CAPSULE | ORAL | Status: DC

## 2014-10-22 NOTE — Telephone Encounter (Signed)
-----   Message from Doris Cheadleeepak Advani, MD sent at 10/22/2014 11:40 AM EST ----- Blood work reviewed, noticed low vitamin D, call patient advise to start ergocalciferol 50,000 units once a week for the duration of  12 weeks.

## 2014-10-22 NOTE — Telephone Encounter (Signed)
Pt is aware of her lab results. I sent medication to her pharmacy.

## 2014-11-04 IMAGING — US US RENAL KIDNEY
1 series · 14 of 25 positions shown · non-contrast
Comparison: none

REASON FOR EXAM: hematuria back pain
COMMENTS:

[Series 1: us renal kidney · 0.31mm/px · 14 of 30 slices shown]
[im 1/30]
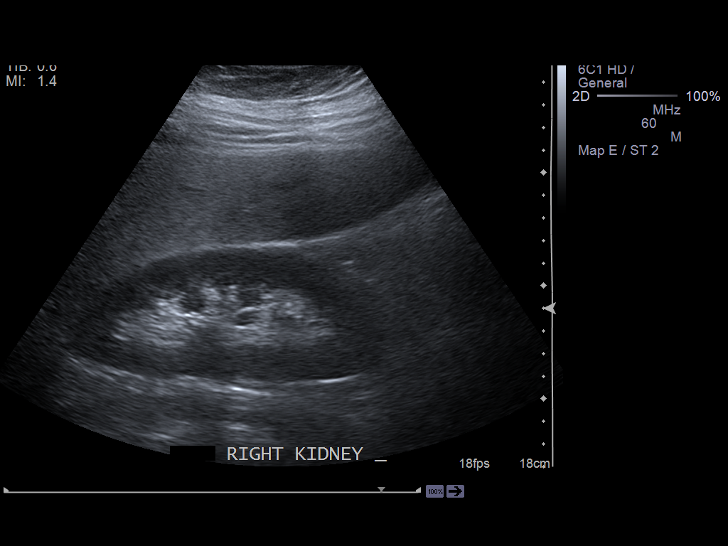
[im 3/30]
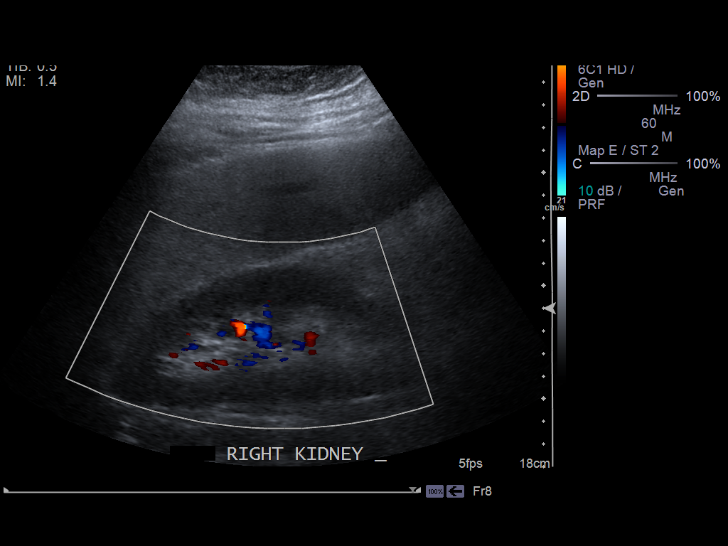
[im 5/30]
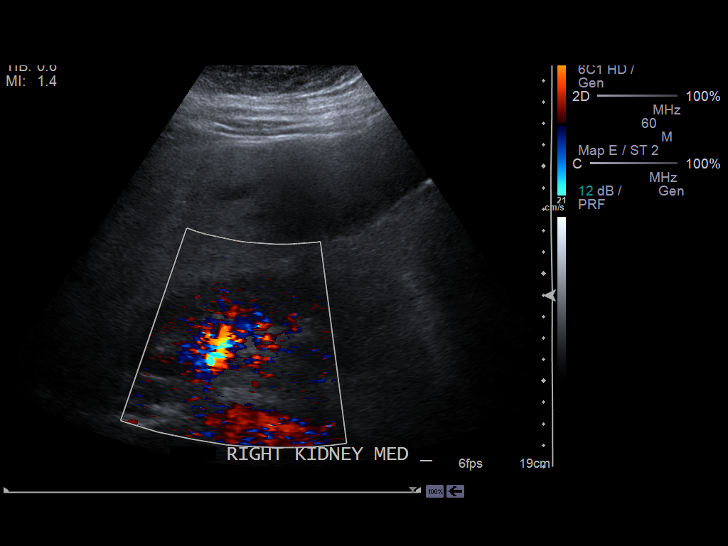
[im 8/30]
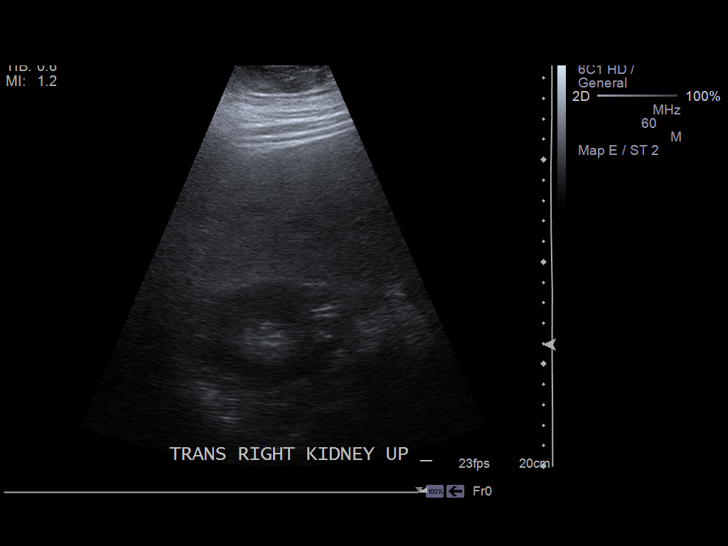
[im 10/30]
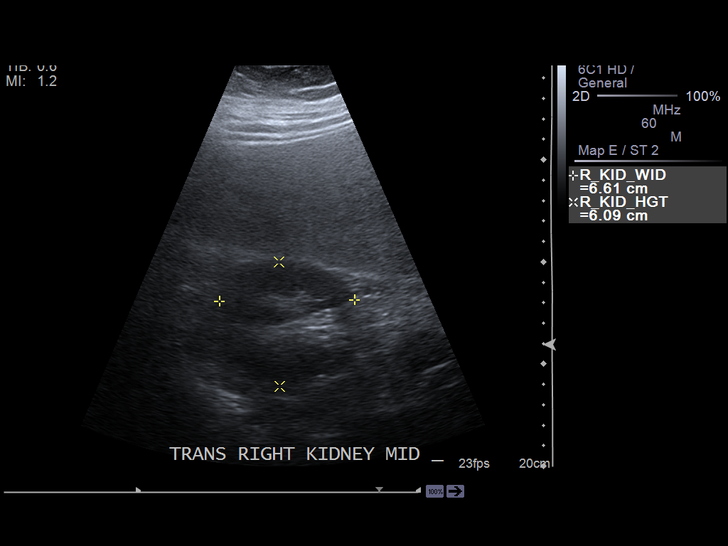
[im 11/30]
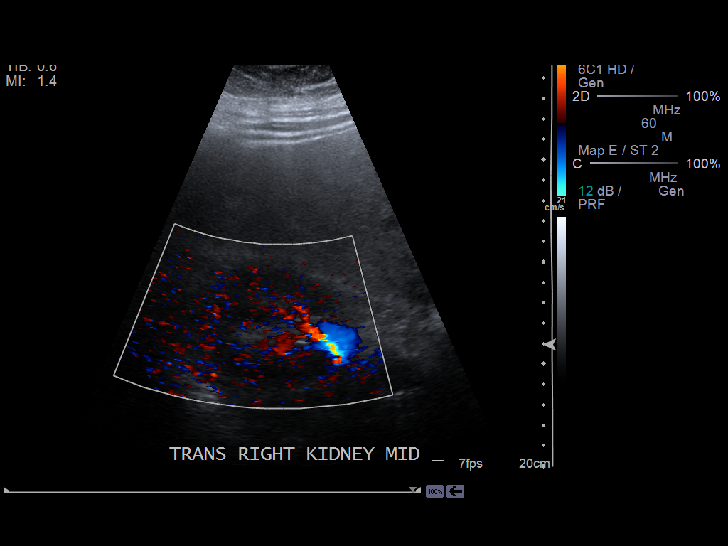
[im 14/30]
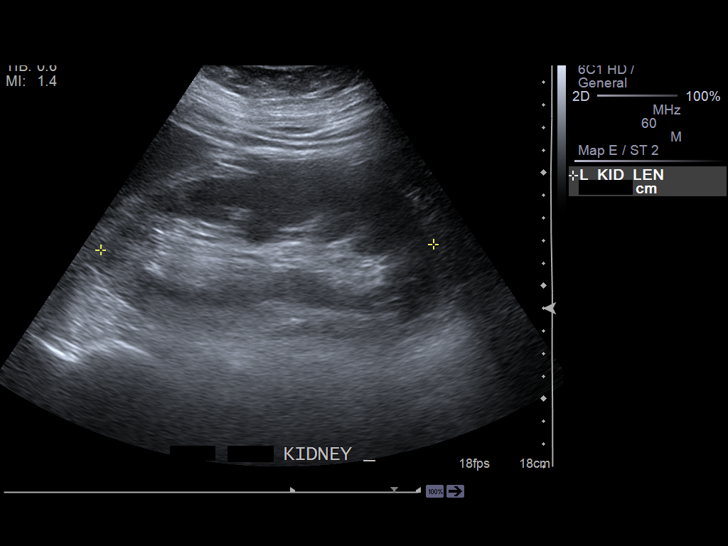
[im 16/30]
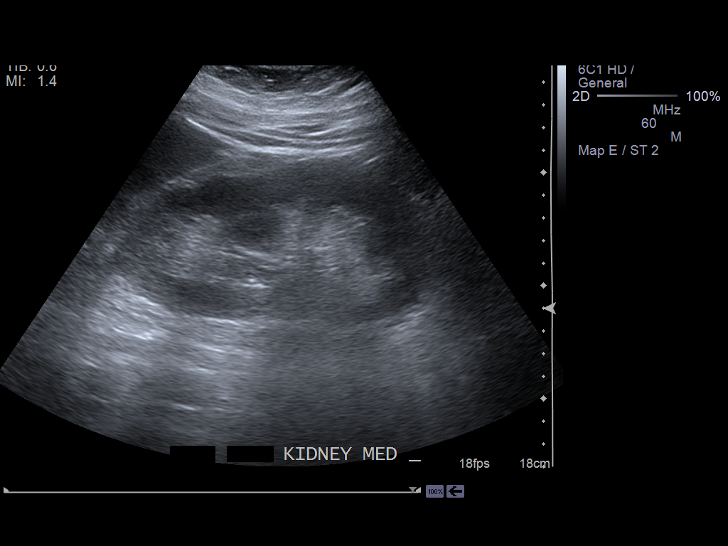
[im 19/30]
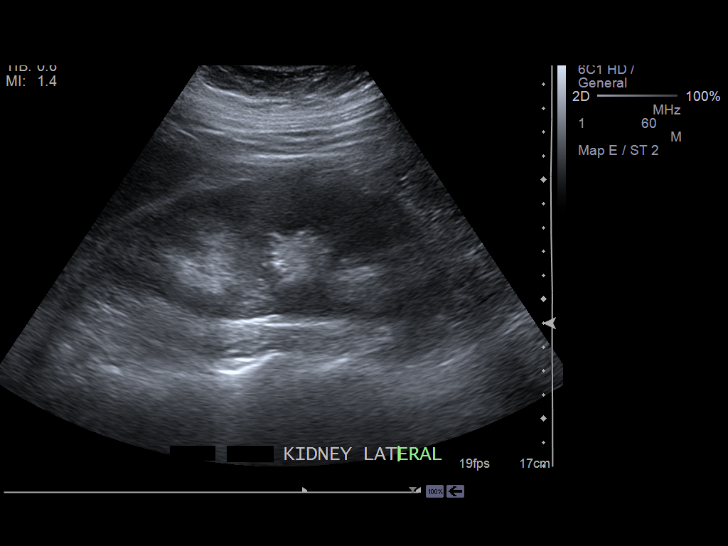
[im 20/30]
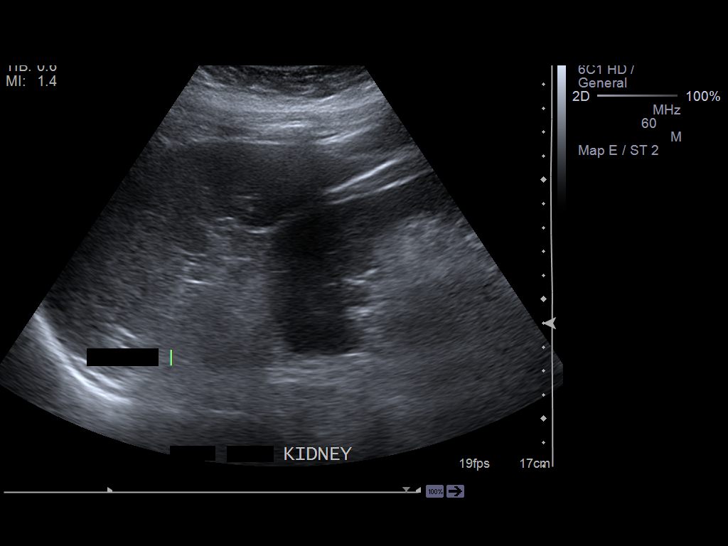
[im 22/30]
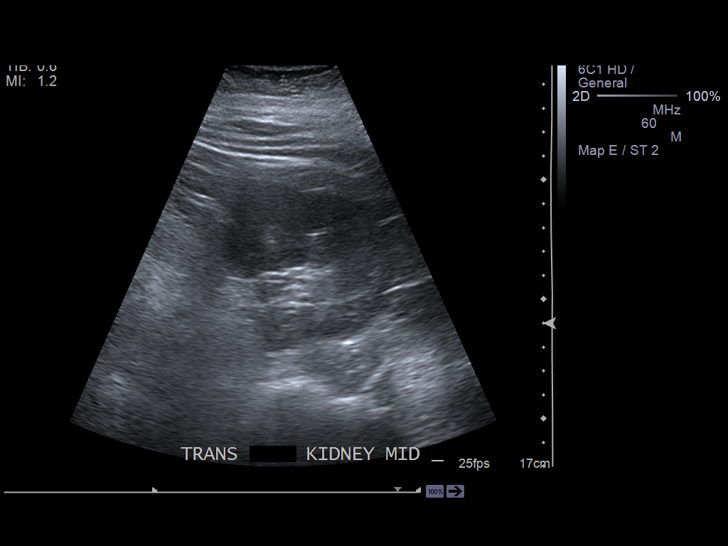
[im 25/30]
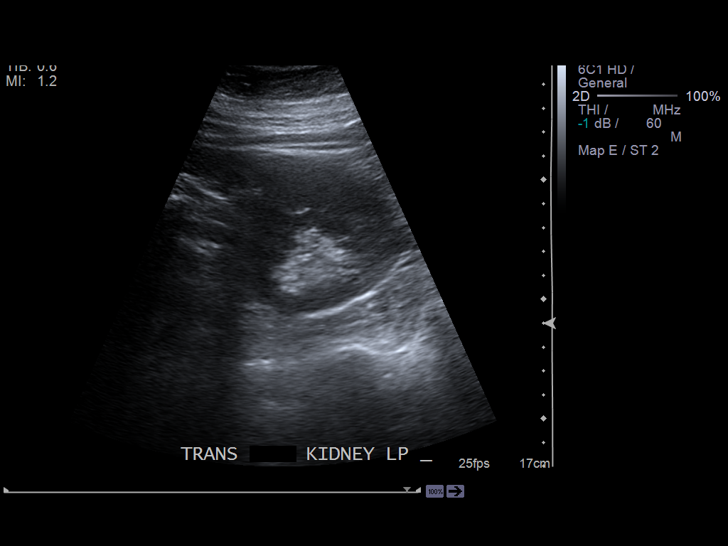
[im 27/30]
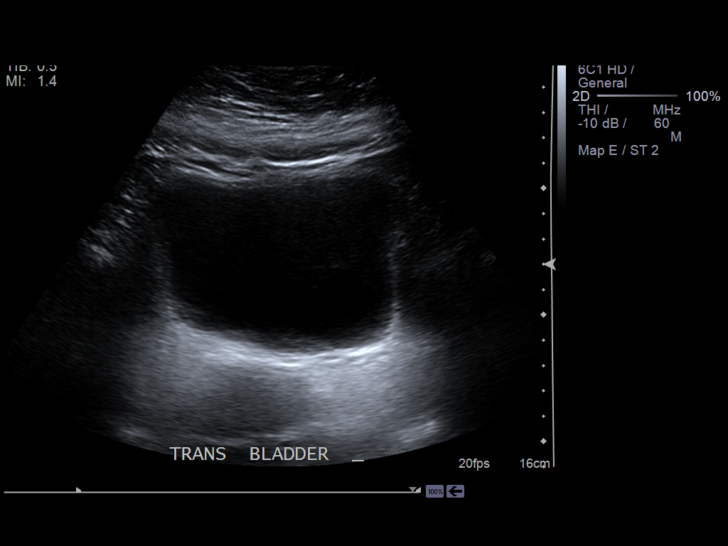
[im 30/30]
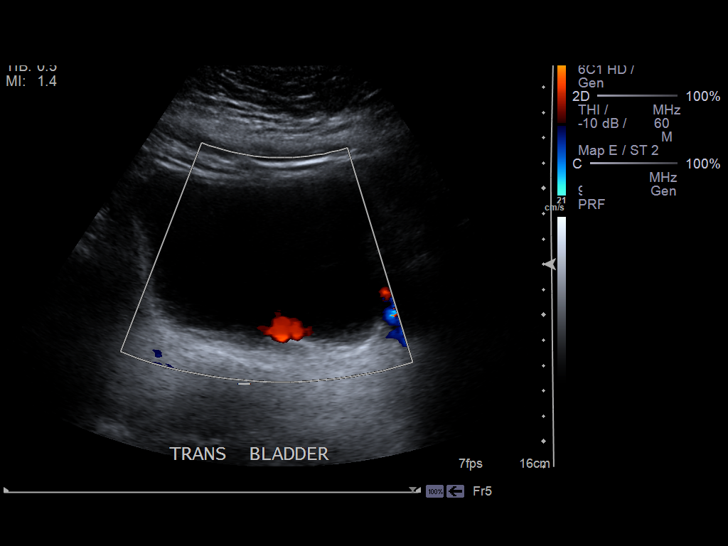

[14 of 25 positions shown; findings below may reference images not displayed]

PROCEDURE:     US  - US KIDNEY  - September 15, 2013 [DATE]

RESULT:     Renal ultrasound shows the right kidney measures 13.21 x 6.61 x
6.09 cm. The left kidney measures 14.72 x 6.45 x 6.83 cm. Urinary jets are
seen at the bladder level bilaterally. Neither kidney shows hydronephrosis,
nephrolithiasis or a solid or cystic mass.
IMPRESSION: 1. Normal-appearing renal ultrasound.

[REDACTED]

## 2015-02-16 ENCOUNTER — Inpatient Hospital Stay: Payer: Self-pay | Admitting: Internal Medicine

## 2015-02-19 LAB — CBC WITH DIFFERENTIAL/PLATELET
BASOS ABS: 0 10*3/uL (ref 0.0–0.1)
Basophil %: 0.4 %
Eosinophil #: 0.2 10*3/uL (ref 0.0–0.7)
Eosinophil %: 2.3 %
HCT: 32.9 % — ABNORMAL LOW (ref 35.0–47.0)
HGB: 11 g/dL — AB (ref 12.0–16.0)
LYMPHS ABS: 2 10*3/uL (ref 1.0–3.6)
Lymphocyte %: 20.2 %
MCH: 28.7 pg (ref 26.0–34.0)
MCHC: 33.5 g/dL (ref 32.0–36.0)
MCV: 86 fL (ref 80–100)
MONO ABS: 0.6 x10 3/mm (ref 0.2–0.9)
Monocyte %: 6 %
Neutrophil #: 7.1 10*3/uL — ABNORMAL HIGH (ref 1.4–6.5)
Neutrophil %: 71.1 %
Platelet: 277 10*3/uL (ref 150–440)
RBC: 3.85 10*6/uL (ref 3.80–5.20)
RDW: 14.5 % (ref 11.5–14.5)
WBC: 9.9 10*3/uL (ref 3.6–11.0)

## 2015-02-19 LAB — BASIC METABOLIC PANEL
Anion Gap: 9 (ref 7–16)
BUN: <5 mg/dL
CO2: 25 mmol/L
CREATININE: 0.38 mg/dL — AB
Calcium, Total: 8 mg/dL — ABNORMAL LOW
Chloride: 104 mmol/L
EGFR (Non-African Amer.): >60
GLUCOSE: 110 mg/dL — AB
Potassium: 3.3 mmol/L — ABNORMAL LOW
Sodium: 138 mmol/L

## 2015-02-19 LAB — PROTEIN / CREATININE RATIO, URINE
CREATININE, URINE RANDOM: 80 mg/dL (ref 30–125)
PROTEIN/CREAT. RATIO: 950 mg/g{creat} — AB (ref 0–200)
Protein, Urine: 76 mg/dL (ref 0–9)

## 2015-02-19 LAB — TRIGLYCERIDES: Triglycerides: 979 mg/dL — ABNORMAL HIGH (ref 0–200)

## 2015-02-19 LAB — LIPASE, BLOOD: Lipase: 24 U/L

## 2015-02-20 LAB — BASIC METABOLIC PANEL
ANION GAP: 6 — AB (ref 7–16)
BUN: 7 mg/dL
CHLORIDE: 106 mmol/L
CREATININE: 0.36 mg/dL — AB
Calcium, Total: 8.3 mg/dL — ABNORMAL LOW
Co2: 26 mmol/L
Glucose: 239 mg/dL — ABNORMAL HIGH
Potassium: 3.7 mmol/L
Sodium: 138 mmol/L

## 2015-02-20 LAB — CBC WITH DIFFERENTIAL/PLATELET
Basophil #: 0.1 10*3/uL (ref 0.0–0.1)
Basophil %: 0.6 %
EOS ABS: 0.2 10*3/uL (ref 0.0–0.7)
Eosinophil %: 2 %
HCT: 33.5 % — ABNORMAL LOW (ref 35.0–47.0)
HGB: 11 g/dL — ABNORMAL LOW (ref 12.0–16.0)
LYMPHS ABS: 1.8 10*3/uL (ref 1.0–3.6)
Lymphocyte %: 17.8 %
MCH: 28 pg (ref 26.0–34.0)
MCHC: 32.9 g/dL (ref 32.0–36.0)
MCV: 85 fL (ref 80–100)
MONO ABS: 0.9 x10 3/mm (ref 0.2–0.9)
MONOS PCT: 8.6 %
NEUTROS ABS: 7.1 10*3/uL — AB (ref 1.4–6.5)
Neutrophil %: 71 %
Platelet: 310 10*3/uL (ref 150–440)
RBC: 3.94 10*6/uL (ref 3.80–5.20)
RDW: 14.1 % (ref 11.5–14.5)
WBC: 10 10*3/uL (ref 3.6–11.0)

## 2015-02-20 LAB — TRIGLYCERIDES: TRIGLYCERIDES: 635 mg/dL — AB

## 2015-03-17 NOTE — H&P (Signed)
PATIENT NAMEYAMILETH, HAYSE MR#:  [id number] DATE OF BIRTH:  December 06, 1980  DATE OF ADMISSION:  09/09/2012  PRIMARY CARE PHYSICIAN: None local.  REFERRING PHYSICIAN: Maricela Bo, MD  CHIEF COMPLAINT: Abdominal pain since last night.   HISTORY OF PRESENT ILLNESS: This is a 34 year old Caucasian female with a history of hypertension, diabetes, hyperlipidemia, Bell's palsy, and pancreatitis who presented to the ED with the above chief complaint. The patient is alert, awake, and oriented, in no acute distress. According to the patient, she has had abdominal pain since last night which is in the middle part of the abdomen, constant, 6/10, radiating to flank and back associated with nausea but no vomiting or diarrhea. The patient denies any fever or chills, no headache or dizziness, and no dysuria, hematuria, or incontinence. The patient said she lost her job a couple of months ago and feels depressed and started eating fatty foods for the past couple of months due to depression. Actually the patient had one episode of acute pancreatitis last year possibly due to hyperlipidemia, but the patient is not on Crestor medication.   PAST MEDICAL HISTORY:  1. Acute pancreatitis. 2. Hypertension. 3. Diabetes. 4. Hyperlipidemia. 5. Bell's palsy.  PAST SURGICAL HISTORY: None.   SOCIAL HISTORY: No smoking or drinking or illicit drugs.   FAMILY HISTORY: Mother has hyperlipidemia. Grandfather had throat cancer. Grandmother had leukemia. The patient's father has diabetes.   REVIEW OF SYSTEMS: CONSTITUTIONAL: The patient denies any fever or chills. No headache or dizziness and no weakness. The patient has lost weight for about 45 pounds initially and then gained weight of about 20 pounds recently. EYES: No double vision or blurred vision. ENT: No postnasal drip, epistaxis, slurred speech, or dysphagia. RESPIRATORY: No cough, sputum, shortness of breath, or hemoptysis. CARDIOVASCULAR: No chest pain, palpitations,  orthopnea, or nocturnal dyspnea. No leg edema. GASTROINTESTINAL: Positive for abdominal pain and nausea, but no vomiting or diarrhea. No melena or bloody stools. GU: No dysuria, hematuria, or incontinence. SKIN: No rash or jaundice. ENDOCRINE: Positive for polyuria and polydipsia, but no heat or cold intolerance. HEMATOLOGY: No easy bruising or bleeding. NEUROLOGY: No syncope, loss of consciousness, or seizure. PSYCHIATRY: Depression but no anxiety.   DRUG ALLERGIES: No known drug allergies.   HOME MEDICATIONS:  1. Lisinopril 10 mg p.o. daily.  2. Metformin 1000 mg p.o. twice a day. 3. Lantus 60 units subcutaneous at bedtime.  4. Glipizide 5 mg p.o. twice a day.   PHYSICAL EXAMINATION:   VITALS: Temperature 99.3, blood pressure 115/79, pulse 109, respirations 18, and oxygen saturation 99% on room air.   GENERAL: The patient is alert, awake, and oriented, in no acute distress, obese.   HEENT: Pupils are round, equal, and reactive to light and accommodation. Moist oral mucosa. Clear oropharynx.   NECK: Supple. No JVD or carotid bruits. No lymphadenopathy. No thyromegaly.   CARDIOVASCULAR: S1 and S2 regular rate and rhythm. No murmurs or gallops.   PULMONARY: Bilateral air entry. No wheezing or rales. No use of accessory muscles to breathe.   ABDOMEN: Soft, obese. No distention but there is diffuse tenderness in the middle part of the abdomen. No guarding or rebound. Bowel sounds present. No organomegaly.   EXTREMITIES: No edema, clubbing, or cyanosis. No calf tenderness. Strong bilateral pedal pulses.   SKIN: No rash or jaundice.   NEUROLOGY: Alert and oriented x3. No focal deficit. Power 5/5. Sensation intact. Deep tendon reflexes 2+.   LABS/RADIOLOGIC STUDIES: Ultrasound of abdomen showed findings likely secondary  to pancreatitis, normal gallbladder.   Urinalysis shows RBC 33, WBC 37, and nitrite negative.   CBC showed WBC 20, hemoglobin 14.3, and platelets 418. Glucose 395, BUN  13, creatinine 0.61, sodium 124, potassium 4.3, chloride 89, calcium 9.4, SGPT 23, and SGOT 13. Lipase 2877.   IMPRESSION:  1. Acute pancreatitis.  2. Urinary tract infection.  3. Leukocytosis.  4. Hyponatremia.  5. Sinus tachycardia.  6. Hypertension.  7. Diabetes.  8. Hyperlipidemia.  9. Obesity.   PLAN OF TREATMENT: The patient will be admitted to the medical floor. We will keep n.p.o. except medications and give normal saline IV fluid support and follow-up BMP and lipase level. For urinary tract infection and leukocytosis, we will start Rocephin and follow-up urine culture and CBC. We will check lipid panel and Hemoglobin A1c. Start sliding scale. Give Lantus half dose at 30 units subcutaneous at bedtime due to n.p.o. status. May adjust the dose depending on the patient's blood sugar. For hypertension we will continue lisinopril. GI and deep vein thrombosis prophylaxis.      I discussed the patient's situation and plan of treatment with the patient.   TIME SPENT: About a 60 minutes.   ____________________________ Shaune PollackQing Sagal Gayton, MD qc:slb D: 09/09/2012 09:33:15 ET T: 09/09/2012 09:52:13 ET JOB#: [id number]  cc: Shaune PollackQing Jakobe Blau, MD, <Dictator> Shaune PollackQING Lundynn Cohoon MD ELECTRONICALLY SIGNED 09/11/2012 15:28

## 2015-03-17 NOTE — Discharge Summary (Signed)
PATIENT NAMJacqulynn Nguyen:  [name], [name] MR#:  [id number] DATE OF BIRTH:  Sep 24, 1981  DATE OF ADMISSION:  09/09/2012 DATE OF DISCHARGE:  09/12/2012  PRIMARY CARE PHYSICIAN: No PCP.  DISCHARGE DIAGNOSES:  1. Acute pancreatitis, possibly due to hyperlipidemia.  2. Hypertension.  3. Diabetes.   CODE STATUS: FULL CODE.   CONDITION: Stable.   HOME MEDICATIONS:  1. Lisinopril 10 mg p.o. daily.  2. Metformin 1000 mg p.o. b.i.d.  3. Glipizide 5 mg p.o. b.i.d.  4. Lantus 60 units sub-Q at bedtime.   NEW MEDICATION: Fenofibrate 145 mg p.o. daily.   DIET: Low sodium, low fat, low cholesterol ADA diet.   ACTIVITY: As tolerated.   FOLLOW-UP CARE: Follow-up with PCP within 1 to 2 weeks.   DISCHARGE INSTRUCTIONS: No fatty food at all. The patient needs exercise.   REASON FOR ADMISSION: Abdominal pain.   HOSPITAL COURSE:  1. The patient is a 34 year old Caucasian female with a history of hypertension, diabetes, hyperlipidemia, and pancreatitis who presented to the ED with abdominal pain which is constant, 6/10, radiating to the flank and back with nausea. The patient's lipase was high at 2877. Ultrasound showed pancreatitis changes, normal gallbladder. The patient was admitted for acute pancreatitis. At admission the patient was kept n.p.o. and IV fluid support with normal saline 150 mL/h. The patient's lipid panel showed triglyceride 2241, cholesterol 273, HDL 27. The patient's pancreatitis is possibly due to hyperlipidemia, high triglyceride. The patient also said she has eaten a lot of fatty food recently.  2. Hypomagnesemia. The patient and magnesium was 1.6 and was treated with supplement and became normal.  3. Leukocytosis. The patient's white count was 20 on admission date and then decreased to 11.4 today possibly due to acute pancreatitis and urinary tract infection.  4. The patient's urinalysis showed urinary tract infection with WBC 37. The patient has been treated with Rocephin IVPB.   5. Hypertension. Controlled with lisinopril.  6. Diabetes. Has been treated with Lantus sub-Q and sliding scale. Blood sugar is about 100 to 200.   After above treatment the patient's symptoms have much improved. Lipase has decreased to 325. The patient's vital signs are stable. She is clinically stable and will be discharged to home today. Also, the patient's hemoglobin A1c is 11.2 and magnesium today is 1.5, was treated with magnesium supplement. The patient will need to follow-up with primary care physician.   Discussed the patient's discharge plan with the patient, case manager, and family member.   TIME SPENT: About 36 minutes.   ____________________________ [name] PollackQing Abdirahim Flavell, MD qc:drc D: 09/12/2012 13:24:42 ET T: 09/12/2012 14:12:51 ET JOB#: [id number]  cc: [name] PollackQing Ramyah Pankowski, MD, <Dictator> [name] PollackQING Torii Royse MD ELECTRONICALLY SIGNED 09/13/2012 14:25

## 2015-03-20 NOTE — Consult Note (Signed)
PATIENT NAME:  [name], [name] MR#:  [id number] DATE OF BIRTH:  [birth year]  DATE OF CONSULTATION:  09/11/2013  REFERRING PHYSICIAN: Felipa Furnace, MD   CONSULTING PHYSICIAN: Wendall Mola, MD   CHIEF COMPLAINT: Diabetes, hypertriglyceridemia and acute pancreatitis.   HISTORY OF PRESENT ILLNESS: This is a 34 year old female seen in consultation for acute pancreatitis. She has a history of morbid obesity, type 2 diabetes on insulin, hypertriglyceridemia, as had two prior hospitalizations at this facility for acute pancreatitis. She presented with one day of abdominal pain and nausea with vomiting. She denie diarrhea or loose stools. She did not have fevers. On presentation, she was found to have abdominal pain an elevated a lipase of 1373, elevated triglycerides greater than 4000, elevated white blood cell count of 19.5; all consistent with acute pancreatitis due to severe hypertriglyceridemia. Her initial blood sugar was 259. She was started on IV fluids and IV insulin and her blood sugars have slowly improved, and today her blood sugars range 150-185. She still has abdominal pain. She has poor appetite. She has nausea.   She has had diabetes for the last five years. She has been on insulin for last two years. She takes Lantus 30 units b.i.d., metformin 1000 mg b.i.d., and glipizide 10 mg daily for her diabetes. She does not check her blood sugars. She complains of the expense of blood glucose test strips. She gets her medications at a free clinic where she attends in [place]. She does not recall her last hemoglobin A1c; however, believes about a year ago been around 10.3%.   PAST MEDICAL HISTORY: 1.  Type 2 diabetes.  2.  Morbid obesity.  3.  Acute pancreatitis with two prior hospitalizations.  4.  Hypertriglyceridemia.   OUTPATIENT MEDICATIONS: 1.  Lantus 30 units twice a day.  2.  Glipizide 10 mg daily.  3.  Metformin 1000 mg b.i.d.  4.  Lisinopril 10 mg daily.  5.  Pravastatin  40 mg at bedtime.  6.  TriCor 140 mg daily.   ALLERGIES: No known drug allergies.   SOCIAL HISTORY: The patient is single, she lives with her parents, and she is unemployed. She denies use of tobacco or alcohol.   FAMILY HISTORY: Father also has diabetes, as does a grandfather. There is no known family history of acute pancreatitis.  Family history is also positive for hyperlipidemia.   REVIEW OF SYSTEMS:   GENERAL: Denies weight loss, denies fever.  HEENT: Denies blurred vision. Denies sore throat.  NECK: Denies neck pain. Denies dysphasia.  CARDIAC: Denies chest pain. Denies palpitations.  PULMONARY: Denies cough. Denies shortness of breath.  ABDOMEN: She reports abdominal pain. Appetite is poor. She still has nausea.  EXTREMITIES: Denies leg swelling.  NEUROLOGIC: Denies numbness, tingling or pain in the feet and no recent falls.  HEMATOLOGIC:  Denies easy bruisability or recent bleeding.  ENDOCRINE: Denies heat or cold intolerance.   PHYSICAL EXAMINATION: VITAL SIGNS: Height 63.9 inches, weight 235 pounds, BMI 40.4, temperature 98.5, pulse 116, blood pressure 108/71, pulse oximetry 92%.  GENERAL: Morbidly obese, white, young female in no acute distress.  HEENT: Extraocular movements are intact oropharynx is clear. Mucous membranes moist.  NECK: Supple. No appreciable thyromegaly.  CARDIAC: Tachycardia without murmur.  PULMONARY: Clear bilaterally. No wheeze or rhonchi.  ABDOMEN: Mild diffuse tenderness without guarding.  EXTREMITIES: No edema is present.  SKIN: No rash or dermatopathy or other skin changes are present.  PSYCHIATRIC: Alert and oriented, calm, cooperative.   LYMPHATICS: No submandibular or anterior  cervical lymphadenopathy.   LABORATORY, DIAGNOSTIC, AND RADIOLOGICAL DATA: October 14 at 8:47 p.m. glucose 259, BUN 6, creatinine 0.52, sodium 135, potassium 4, lipase 1373, triglycerides greater than 4000, total cholesterol  512, calcium 9.1, albumin 3.7, AST 34, ALT  29. WBC 19.5, platelets 575. Urinalysis notable for 50 mg/dL glucose, 1+ blood, greater than 500 protein, negative nitrite, negative leukocyte esterase.   ASSESSMENT: A 34 year old female with morbid obesity, type 2 diabetes, hypertriglyceridemia, admitted with acute pancreatitis likely due to severe uncontrolled hypertriglyceridemia. Type 2 diabetes is uncontrolled and may be complicated by proteinuria.   RECOMMENDATIONS: 1.  Agree with use of IV insulin as you are doing and to keep blood sugars in the 100 - 200 range.  2.  Follow lipids daily. Triglycerides should slowly improve her insulin drip as well as the use of daily fibrate.  Would consider stopping the insulin drip and converting to subcutaneous insulin once the triglycerides are less than 1000.  3.  Once she is able to tolerate a diet, I am hopeful we can restart metformin and her sulfonylurea.  4. Long term, she would benefit from good diabetes management and weight loss as this will greatly improve her risk of not developing more episodes of acute pancreatitis.    I will follow along with you.   ____________________________ A. Wendall MolaMelissa Solum, MD ams:cc D: 09/11/2013 17:08:13 ET T: 09/11/2013 18:10:26 ET JOB#: [id number]  cc: A. Wendall MolaMelissa Solum, MD, <Dictator> Macy MisA. MELISSA SOLUM MD ELECTRONICALLY SIGNED 09/24/2013 15:48

## 2015-03-20 NOTE — H&P (Signed)
PATIENT NAMJacqulynn Cadet:  Nguyen, [name]:  [id number] DATE OF BIRTH:  Oct 03, 1981  DATE OF ADMISSION:  09/11/2013  REFERRING PHYSICIAN:  Dr. Dolores FrameSung.   PRIMARY CARE PHYSICIAN:  Nonlocal, she goes to the free clinic in [place].   CHIEF COMPLAINT:  Abdominal pain.   HISTORY OF PRESENT ILLNESS:  [name] is a 34 year old Caucasian female with past medical history of hypertriglyceridemia, type 2 diabetes on insulin, hyperlipidemia and obesity and recurrent pancreatitis, who is presenting with abdominal pain.  She states that her symptoms started yesterday morning with epigastric pain which was gradually worsening.  At its worst it was 8 out of 10 in intensity, sharp in the epigastric region radiating to her back.  She found no relieving or worsening factors, states that this is how she previously presented with pancreatitis in the past.  Upon arrival to the Emergency Department, she was in acute distress secondary to pain.  She has been given Dilaudid and her symptoms have since resolved.    REVIEW OF SYSTEMS: CONSTITUTIONAL:  Denies fevers, chills, fatigue, weakness.  EYES:  Denies blurred vision, double vision.  EARS, NOSE, THROAT:  Denies ear pain, hearing loss or discharge.  RESPIRATORY:  Denies cough, wheeze, shortness of breath.  CARDIOVASCULAR:  Denies chest pain, palpitations, or edema.  GASTROINTESTINAL:  Positive for nausea and abdominal pain as above.  Denies vomiting or diarrhea.  GENITOURINARY:  Denies dysuria, hematuria.  ENDOCRINE:  Denies nocturia or thyroid problems.  HEMATOLOGIC AND LYMPHATIC:  Denies anemia or easy bruising and bleeding.  SKIN:  Denies any rashes or lesions.  MUSCULOSKELETAL:  Denies pain in her neck, back, shoulders, knees or hips, arthritic symptoms or joint swelling.  NEUROLOGIC:  Denies any paralysis or paresthesias.  PSYCHIATRIC:  Denies any anxiety or depressive symptoms.  Otherwise, full review of systems performed by me is negative.   PAST MEDICAL HISTORY:   Hypertriglyceridemia, recurrent pancreatitis with two bouts previously, type 2 diabetes insulin requiring, hyperlipidemia, morbid obesity and anxiety.   FAMILY HISTORY:  Positive for hyperlipidemia and diabetes.  She does not have a family history of hypertriglyceridemia.   SOCIAL HISTORY:  Denies any alcohol, tobacco or drug usage.   ALLERGIES:  No known drug allergies.   HOME MEDICATIONS:  Fenofibrate 140 mg by mouth daily, glipizide 5 mg by mouth twice daily, Lantus 60 units subcutaneous at bedtime, lisinopril 10 mg by mouth daily, metformin 1000 mg by mouth twice daily, pravastatin 40 mg by mouth at bedtime.   PHYSICAL EXAMINATION: VITAL SIGNS:  Temperature 100.1 degrees Fahrenheit, heart rate 122, respirations 16, blood pressure 150/56, saturating 95% on room air.  Weight 106.6 kg, BMI 40.4.  GENERAL:  Well-nourished, obese Caucasian female in no acute distress.  HEAD:  Normocephalic, atraumatic.  EYES:  Pupils equal, round, reactive to light as well as accommodation.  Extraocular muscles intact.  No scleral icterus.  MOUTH:  Moist mucosal membranes.  Dentition intact.  No abscess noted.  EARS, NOSE, THROAT:  Clear without exudates.  No external lesions.  NECK:  Supple.  No thyromegaly.  No nodules.  No JVD.  PULMONARY:  Clear to auscultation bilaterally with no wheezes, rubs or rhonchi.  No use of accessory muscles.  Good respiratory effort.  CHEST:  Nontender to palpation.  CARDIOVASCULAR:  S1, S2, tachycardic.  No murmurs, rubs or gallops.  No peripheral edema.  Pedal pulses 2+ bilaterally.  GASTROINTESTINAL:  Soft, obese, minimal tenderness at the epigastric region without rebound or guarding.  No motion tenderness, nondistended.  No masses.  Positive bowel sounds.  No hepatosplenomegaly.  MUSCULOSKELETAL:  No swelling, clubbing or edema.  Range of motion full in all extremities.  NEUROLOGIC:  Cranial nerves II through XII intact.  No gross neurological deficits.  Sensation intact.   Reflexes intact.  SKIN:  No ulcerations, lesions, rashes or cyanosis.  Skin is warm and dry.  Turgor is intact.  PSYCHIATRIC:  Mood and affect within normal limits.  Awake, alert and oriented x 3.  Insight and judgment are intact.   LABORATORY DATA:  Sodium 135, potassium 4, chloride 102, bicarb 22, BUN 6, creatinine 0.52, glucose 259, lipase 1373.  LFTs within normal limits.  WBC 19.5, hemoglobin unable to report secondary to severe lipemia, platelets of 575.  Urinalysis positive for proteinuria and negative for signs of infection.   ASSESSMENT AND PLAN:  A 34 year old Caucasian female with past medical history of hypertriglyceridemia, type 2 diabetes, hyperlipidemia, obesity, recurrent pancreatitis, presenting with abdominal pain, has an elevated lipase on arrival.  1.  Acute pancreatitis, likely secondary to hypertriglyceridemia.  IV fluid with normal saline at 20  mL an hour, pain control with Dilaudid.  The patient will be nothing by mouth.  We will check lipids, if elevated triglyceride level we will start insulin drip at 0.1 units/kg per hour with q. 2 hour Accu-Cheks.  She will likely also need the addition of D5 to her IV fluids and place her on insulin drip.  Continue insulin drip until triglycerides are less than 500.  Also start gemfibrozil 600 mg by mouth twice daily now.  2.  Type 2 diabetes.  Hold by mouth agents.  She will likely require an insulin drip for the acute nature of her disease.  3.  Hyperlipidemia.  Continue with pravastatin.   4.  DVT prophylaxis with heparin subQ.  5.  The patient is FULL CODE.   TIME SPENT:  45 minutes.    ____________________________ [name]. Demetrie Borge, MD dkh:ea D: 09/11/2013 03:07:19 ET T: 09/11/2013 04:27:46 ET JOB#: [id number]  cc: [name]. Lue Dubuque, MD, <Dictator> [name] Rennaker Synetta Shadow MD ELECTRONICALLY SIGNED 09/19/2013 4:10

## 2015-03-20 NOTE — Consult Note (Signed)
Chief Complaint and History:  Referring Physician Dr. Berlinda LastSanchez Gutierrez   Chief Complaint diabetes, obesity, hyperTg, acute pancreatitis   Allergies:  No Known Allergies:   Assessment/Plan:  Assessment/Plan Patient was seen, interviewed, and chart was reviewed. 34 yo F with morbid obesity, type 2 diabetes on insulin, hypertriglyceridemia with h/o acute pancreatitis with prior hospitalizations for this x2 admitted with recurrent acute pancreatitis. She is now on an insulin drip, IVF, and gemfibrozil. She has ongoing pain. HD stable. Exam fairly unremarkable.  A/ 1. Acute pancreatitis and stable, due to severe hypertriglyceridemia 2. Morbid obesity  3. Uncontrolled type 2 diabetes  P/ Agree with IV insuln as you are doing. Aim to keep BG in the 100-200 range. Check daily lipids. Consider converting to SQ insulin once the triglycerides are <1000. Continue fibrate. Await A1c (pending). Needs to lose weight.  I will follow along with you. Full dictated consult to follow.   Case Discussed With patient   Electronic Signatures: Raj JanusSolum, Lewis Grivas M (MD)  (Signed 15-Oct-14 16:54)  Authored: Chief Complaint and History, ALLERGIES, Assessment/Plan   Last Updated: 15-Oct-14 16:54 by Raj JanusSolum, Keirah Konitzer M (MD)

## 2015-03-20 NOTE — Discharge Summary (Signed)
PATIENT NAMJacqulynn Nguyen:  Lack, Yannely MR#:  [id number] DATE OF BIRTH:  Apr 01, 1981  DATE OF ADMISSION:  09/11/2013 DATE OF DISCHARGE:  09/14/2013   REASON FOR ADMISSION: Abdominal pain.   DISCHARGE DIAGNOSES: 1.  Acute pancreatitis.  2.  Severe hyperlipidemia causing the pancreatitis.  3.  Systemic inflammatory response syndrome with leukocytosis, tachycardia, due to pancreatitis, not to infection.  4.  Hyponatremia.  5.  Hypertension.  6.  Uncontrolled diabetes.  7.  Hypokalemia.   DISPOSITION: Home.   MEDICATIONS AT DISCHARGE: Lisinopril 10 mg once daily, fenofibrate 145 mg once daily, pravastatin 40 mg once daily, metformin 1000 mg twice daily, Lantus 40 units subcutaneously twice daily. (As a side note, Dr. Tedd SiasSolum has recommended the patient to go on 60 units twice daily, but the patient previously was taking only 30 units twice daily and she is not going to be able to check her Accu-Cheks. She does not have money to pay for the strips. She has multiple machines at home, but she says she is not going to be able to check it even if we give her one, for what I do not feel safe about doing this. The patient needs to follow up with her primary care physician, who will be able to try to authorize her some strips.)  Aspart insulin 20 units 3 times daily before meals, gemfibrozil 600 mg twice daily, potassium chloride 20 mEq once daily.  FOLLOWUP: Dr. Tedd SiasSolum in 1 to 2 weeks. PCP in [place] at [facility] in 1 to 2 weeks.   IMPORTANT LABORATORY RESULTS: Glucose 259 on admission, creatinine 0.5, sodium 135, potassium 4. Cholesterol total 512, triglycerides 4000, HDL 31, lipase 1300. Followup triglycerides were down to 973 and then 1700, and now they are 700. Her lowest potassium was 2.8 during the hospitalization. White count 12,000 on discharge, on admission 19,000; hemoglobin 11, at discharge is 10.3. Urinalysis: No signs of urinary tract infection, 2 white blood cells, 3 red blood cells.   HOSPITAL  COURSE: This is a very nice 34 year old female who came with a history of abdominal pain. She has history of uncontrolled diabetes. She does not have insurance. She has a history of obesity, binge eating and hyperlipidemia. She has had recurrent pancreatitis in the past and she comes today with the same. The symptoms started the day before admission. Intensity of the pain was 8 out of 10. The patient was evaluated in the ER. She was found to be tachycardic at 122 with a temperature of 100.1. She was admitted to the floor. She was put on an insulin drip to lower down her triglycerides, as they were above 4000. The insulin drip was removed once the triglycerides were below 1000, or at least about 1000. The patient has had some severe nausea and abdominal pain, but those issues resolved after resting the pancreas. Her diet was advanced and she was able to eat without any significant problems.   For her hyperlipidemia, the patient is going to be taking gemfibrozil, fenofibrate and pravastatin. For her diabetes, she is going to be on 40 units twice daily of Lantus, as we discussed earlier.   The patient is doing great, is ready to go home today.   I spent about 45 minutes with this discharge.   ____________________________ Felipa Furnaceoberto Sanchez Gutierrez, MD rsg:jm D: 09/14/2013 14:56:58 ET T: 09/14/2013 15:45:20 ET JOB#: [id number]  cc: Felipa Furnaceoberto Sanchez Gutierrez, MD, <Dictator> A. Wendall MolaMelissa Solum, MD Regan RakersOBERTO Juanda ChanceSANCHEZ GUTIERRE MD ELECTRONICALLY SIGNED 09/21/2013 23:14

## 2015-03-21 NOTE — Discharge Summary (Signed)
Dates of Admission and Diagnosis:  Date of Admission 09-Oct-2014   Date of Discharge 13-Oct-2014   Admitting Diagnosis acute pancreatitis   Final Diagnosis Acute pancreatitis- due to hypertriglyceridemia Hyperglycemia- uncontrolled Diabetes Urinary tract infection    Chief Complaint/History of Present Illness 34 year old female with past medical history of recurrent pancreatitis, severe hyperlipidemia causing recurrent pancreatitis, uncontrolled diabetes, hypertension, and hyperlipidemia, who presents today with 2 to 3 days of stabbing central abdominal pain. She states that symptoms are familiar to prior episodes of acute pancreatitis. She reports that she has not had any vomiting. She has had nausea and diarrhea. No fevers or chills. She has had increasing urinary frequency. No dysuria. On presentation to the Emergency Room, she is found to be hyperglycemic with blood sugars greater than 500, lipase 619,000, unable to obtain many labs due to hyperviscosity of blood. She also has a urinary tract infection. Hospitalist services are asked to admit her for further evaluation and treatment.   Allergies:  No Known Allergies:   Routine Chem:  12-Nov-15 08:46   Sodium, Serum  123  Lipase  619 (Result(s) reported on 09 Oct 2014 at 10:57AM.)    11:35   Sodium, Serum  123    16:49   Sodium, Serum  129  13-Nov-15 04:42   Triglycerides, Serum  2761  Sodium, Serum  130  14-Nov-15 05:03   Sodium, Serum  131  Lipase 206 (Result(s) reported on 11 Oct 2014 at 08:26AM.)  15-Nov-15 04:32   Triglycerides, Serum  866  16-Nov-15 04:23   Triglycerides, Serum  804   Pertinent Past History:  Pertinent Past History 1.  Recurrent pancreatitis caused by severe hyperlipidemia.  2.  Severe hyperlipidemia, hypertriglyceridemia.  3.  Hypertension.  4.  Diabetes mellitus type 2, uncontrolled.  5.  History of Bell palsy.  6.  Diabetic peripheral neuropathy with foot pain.   Hospital Course:  Hospital  Course 1.  Urinary tract infection. followed up  urine culture- strep agalactie. Covering with Rocephin. Treating trichomonas with metronidazole. 5th day of Abx in hospital. 2.  Severe hyperglycemia, probable hyperosmolar nonketotic state. - uncontrolled DM .continue hydration,  10.4  hemoglobin A1c. Councelled about changing diet type and pattern.    Had a long discussion with her about checking blood sugar daily and adjusting insulin- and taking the recording of lood sugar to   PMD on appoitment. 3.  Acute pancreatitis with lipase of 619. The patient was n.p.o.  iv hydration.     likely due to severe hypertriglyceridemia and dehydration in the setting of hyperglycemia.     resolcved now- very less pain- started clear liquid diet. tolerated soft diet. 4.  Hypertriglyceridemia.  hydrate, continue with statin and binders.      ch issue, encouragd about changing her diet and eating less amount of cheese and oil. She will try.    TG is 800 today- by not eating for 2 days.  5.  Hypoxia.- Resolved.  6.  Leukocytosis. Likely due to urinary tract infection.   Condition on Discharge Stable   Code Status:  Code Status Full Code   DISCHARGE INSTRUCTIONS HOME MEDS:  Medication Reconciliation: Patient's Home Medications at Discharge:     Medication Instructions  lisinopril 10 mg oral tablet  1 tab(s) orally once a day   metformin 1000 mg oral tablet  1 tab(s) orally 2 times a day, increase to  twice daily in 1 week    glipizide  10 milligram(s) orally once a day  gemfibrozil 600 mg oral tablet  1 tab(s) orally 2 times a day (before meals)   pravastatin 40 mg oral tablet  1 tab(s) orally once a day (at bedtime)   fenofibrate 145 mg oral tablet  1 tab(s) orally once a day   lantus 100 units/ml subcutaneous solution  20 unit(s) subcutaneous 2 times a day   novolog 100 units/ml subcutaneous solution  2 unit(s) subcutaneous 2 times a day (before meals) - 2 units for blood sugar 151-200, 4 unit  for 201-250, 6 for 251-300,; 8 for 301-350; 10 units for 351-400     Physician's Instructions:  Diet Low Sodium  Low Fat, Low Cholesterol  Carbohydrate Controlled (ADA) Diet   Activity Limitations As tolerated   Return to Work Not Applicable   Time frame for Follow Up Appointment 1-2 weeks  PMD   Other Comments Check blood sugar daily 2-3 times and note down- take that record to PMD on visiting.   TIME SPENT:  Total Time: Greater than 30 minutes   Electronic Signatures: Altamese DillingVachhani, Yannick Steuber (MD)  (Signed [phone]-Nov-15 23:41)  Authored: ADMISSION DATE AND DIAGNOSIS, CHIEF COMPLAINT/HPI, Allergies, PERTINENT LABS, PERTINENT PAST HISTORY, HOSPITAL COURSE, DISCHARGE INSTRUCTIONS HOME MEDS, PATIENT INSTRUCTIONS, TIME SPENT   Last Updated: 22-Nov-15 23:41 by Altamese DillingVachhani, Kiki Bivens (MD)

## 2015-03-21 NOTE — H&P (Signed)
PATIENT NAME:  [name], [name] MR#:  [id number] DATE OF BIRTH:  05/16/1981  DATE OF ADMISSION:  10/09/2014  REFERRING EMERGENCY ROOM PHYSICIAN:  Gladstone Pihavid Schaevitz, MD   PRIMARY CARE PHYSICIAN:  None. She goes to a free clinic and sees different providers there. Clinic is called [facility].   CHIEF COMPLAINT:  Abdominal pain.   HISTORY OF PRESENT ILLNESS:  This pleasant 34 year old female with past medical history of recurrent pancreatitis, severe hyperlipidemia causing recurrent pancreatitis, uncontrolled diabetes, hypertension, and hyperlipidemia, who presents today with 2 to 3 days of stabbing central abdominal pain. She states that symptoms are familiar to prior episodes of acute pancreatitis. She reports that she has not had any vomiting. She has had nausea and diarrhea. No fevers or chills. She has had increasing urinary frequency. No dysuria. On presentation to the Emergency Room, she is found to be hyperglycemic with blood sugars greater than 500, lipase 619,000, unable to obtain many labs due to hyperviscosity of blood. She also has a urinary tract infection. Hospitalist services are asked to admit her for further evaluation and treatment.   PAST MEDICAL HISTORY: 1.  Recurrent pancreatitis caused by severe hyperlipidemia.  2.  Severe hyperlipidemia, hypertriglyceridemia.  3.  Hypertension.  4.  Diabetes mellitus type 2, uncontrolled.  5.  History of Bell palsy.  6.  Diabetic peripheral neuropathy with foot pain.   HOME MEDICATIONS: 1.  Pravastatin 40 mg 1 tablet daily.  2.  Metformin 1000 mg 1 tablet twice a day.  3.  Lisinopril 10 mg 1 tablet daily.  4.  Lantus 60 units once a day.  5.  Glipizide 10 mg once a day.  6.  Gemfibrozil 600 mg 1 tablet twice a day.  7.  Fenofibrate 145 mg 1 tablet once a day.   SOCIAL HISTORY:  The patient currently lives with her parents. She is not working. She states this is because she cannot remain healthy enough to keep a job. She does not smoke  cigarettes, drink alcohol, or use any illicit substances. She does not use any home equipment. No oxygen, cane, or walker.   FAMILY MEDICAL HISTORY:  Negative for coronary artery disease or stroke. Her father has diabetes.   REVIEW OF SYSTEMS: CONSTITUTIONAL:  Negative for fever or chills. Positive for abdominal pain. Positive for fatigue. Negative for change in weight.  HEENT:  No change in vision or hearing. No pain in the eyes or ears. No difficulty swallowing.  RESPIRATORY:  No shortness of breath, cough, or wheezing. No history of asthma.  CARDIOVASCULAR:  No chest pain, orthopnea, palpitations, or syncope.  GASTROINTESTINAL:  Positive for nausea and diarrhea as noted. No vomiting. Positive for abdominal pain. No hematochezia noted.  GENITOURINARY:  Positive for frequency. Negative for dysuria.  MUSCULOSKELETAL:  No tender or swollen joints. No change in range of motion or mobility. No myalgias.  NEUROLOGIC:  No focal numbness or weakness. No confusion. No seizure or memory loss.  PSYCHIATRIC:  No uncontrolled anxiety or depression.   PHYSICAL EXAMINATION: VITAL SIGNS:  Temperature 99.4, pulse 118, respirations 18, blood pressure 128/80, oxygenation 95% on room air.  GENERAL:  No acute distress, resting comfortably.  HEENT:  Pupils are equal, round, and reactive to light. Conjunctivae are clear. Extraocular motion is intact. Oral mucous membranes are dry. Oropharynx is clear. Posterior oropharynx with no exudate or edema.  NECK:  Obese, supple. No cervical lymphadenopathy noted.  RESPIRATORY:  Lungs are clear to auscultation bilaterally with good air movement. No respiratory distress.  CARDIOVASCULAR:  Heart sounds are distant, regular. No murmurs, rubs, or gallops. No peripheral edema. Peripheral pulses are 1+.  ABDOMEN:  Soft, tender in the mid epigastric area. No hepatosplenomegaly. Abdomen is distended, obese. Bowel sounds are decreased.  MUSCULOSKELETAL:  No warm, swollen, or tender  joints. Range of motion is normal. Strength is 5/5 throughout.  NEUROLOGIC:  Cranial nerves II through XII are grossly intact. Strength and sensation are intact.  PSYCHIATRIC:  The patient has a fairly stable affect. She is alert and oriented x 4. She has good insight into her clinical condition. She seems slightly distracted in conversation.   LABORATORY DATA:  Sodium is 123, potassium 3.7, chloride 93, bicarbonate 20, serum glucose 465, unable to obtain a BUN or creatinine, lipase 619, total protein 8.1, albumin 2.1, bilirubin 1.5. White blood cells 18.6, hemoglobin 14.2, MCV 85, platelets 428,000. Urinalysis is positive for 31 white blood cells. Pregnancy test is negative. Urinalysis is also positive for trichomonas.   ASSESSMENT AND PLAN: 1.  Urinary tract infection. Send urine for culture. Covering with Rocephin. Treating trichomonas with metronidazole.  2.  Severe hyperglycemia, probable hyperosmolar nonketotic state. Hydrating aggressively. Restarting home insulin. She reports that she has been compliant with home insulin regimen, but it has not been effective in bringing down blood sugars. We will check a hemoglobin A1c.  3.  Acute pancreatitis with lipase of 619. The patient is n.p.o. Provide aggressive hydration. Pancreatitis is likely due to severe hypertriglyceridemia as well as dehydration in the setting of hyperglycemia. We will provide pain control. At this point, given that I cannot estimate her GFR, I will hold off on any additional imaging. Recheck lipase in the morning. Pain is moderate.  4.  Hypertriglyceridemia. We will attempt to hydrate in order to dilute blood so that we can get accurate lab values. We will continue with statin and binders. We will need to discuss inability to process our labs in the morning with the lab. This is a chronic condition for this patient.  5.  Hypoxia. When she arrived to the floor, she was transiently hypoxic with an oxygen saturation of 90% on the  floor. We will get a chest x-ray. Concerned that we may over-hydrate her. We will attempt to wean oxygen as tolerated. She is back to upper 90s on nasal cannula at this point.  6.  Leukocytosis. Likely due to urinary tract infection.   TIME SPENT ON ADMISSION:  45 minutes.   ____________________________ Ena Dawley. Clent Ridges, MD cpw:nb D: 10/09/2014 20:55:45 ET T: 10/09/2014 21:18:38 ET JOB#: [id number]  cc: Santina Evans P. Clent Ridges, MD, <Dictator> Gale Journey MD ELECTRONICALLY SIGNED 10/10/2014 11:54

## 2015-03-29 NOTE — Consult Note (Signed)
PATIENT NAMJacqulynn Nguyen:  Garduno, [name]:  [id number] DATE OF BIRTH:  [birth year]  DATE OF CONSULTATION:  02/17/2015  REFERRING PHYSICIAN:  Ramonita LabAruna Gouru, M.D.  CONSULTING PHYSICIAN:  A. Wendall MolaMelissa Halsey Persaud, MD  CHIEF COMPLAINT: Diabetes, hypertriglyceridemia, and acute pancreatitis.   HISTORY OF PRESENT ILLNESS: This is a 34 year old female seen in consultation for acute pancreatitis. The patient was admitted yesterday with abdominal pain, nausea, vomiting.  Found to have an elevated lipase, elevated blood sugars, elevated triglycerides, consistent with acute pancreatitis due to hypertriglyceridemia. She has had several hospitalizations for this same diagnosis. She has been started on IV insulin and IV fluids; however, earlier today IV insulin was stopped and she was given 1 dose of Lantus 10 units. Blood sugars have increased into the 200s.   The patient has had diabetes for over 5 years. Her outpatient regimen includes Lantus 30 units b.i.d., regular insulin 14-15 units t.i.d. a.c., metformin 1000 mg b.i.d., glipizide 10 mg daily. She has not been checking her blood sugars for the last month, she ran out of test strips. She complains of the expense of her medications and her test strips. Gemfibrozil costs her over $60 per month. Lantus insulin is obtained free from the pharmaceutical company, she has qualified for patient assistance. She buys her regular insulin at [facility] for $25 per vial. Hemoglobin A1c is 10%. Diabetes is chronically uncontrolled. [facility].   Currently, she has decreased appetite. Reports abdominal pain has resolved. Continues to have nausea, vomiting.   PAST MEDICAL HISTORY:  1.  Type 2 diabetes.  2.  Morbid obesity.  3.  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  4.  Hypertriglyceridemia.  5.  Fatty liver disease.   CURRENT MEDICATIONS:  1.  Lantus insulin 10 units daily.  2.  NovoLog insulin sliding scale.  3.   Gemfibrozil 600 mg b.i.d.  4.  Rocephin 1 gram given 1 time. 5.  Potassium chloride 20 mEq b.i.d.  6.  Zantac 150 mg q. 12 hours.  7.  Lovenox 40 mg subcutaneous daily.  8.  Normal saline at 40 mL/hour.  ALLERGIES: No known drug allergies.   SOCIAL HISTORY: The patient lives with her family. She is single. Not employed. No tobacco or alcohol use.   FAMILY HISTORY: Positive for diabetes as well as hyperlipidemia.   REVIEW OF SYSTEMS: GENERAL: Reports a 5 pound weight loss over the last month. No fevers.  HEENT: Denies blurry vision. Denies sore throat.  NECK: Denies neck pain or dysphagia.  CARDIAC: Denies chest pain or palpitations.  PULMONARY: Denies cough or shortness of breath.  ABDOMEN: Abdominal pain has resolved. She has nausea. She has decreased appetite.  ENDOCRINE:  Denies heat or cold intolerance.  GENITOURINARY: Denies polyuria or dysuria.  EXTREMITIES: Denies lower extremity swelling.  SKIN: Denies rash or recent skin changes.   PHYSICAL EXAMINATION:  VITAL SIGNS: Height 63.9 inches, weight 230 pounds, pulse 118, respirations 23, blood pressure 143/79, pulse oximetry 93% on 2 liters O2.  GENERAL: Obese, white female in no acute distress.  HEENT: EOMI.  Oropharynx is clear. Mucous membranes moist.  NECK: Supple. No thyromegaly is appreciated. No palpable thyroid nodules. No neck tenderness to palpation.  CARDIAC: Tachycardia without murmur.  PULMONARY: Clear to auscultation bilaterally. No wheeze.  ABDOMEN: Diffusely soft, nontender, nondistended.  EXTREMITIES: No peripheral edema is present.  SKIN: No xanthoma or xanthomata are present. No rash or recent skin changes.  NEUROLOGIC: Alert, oriented. No dysarthria.  PSYCHIATRIC: Calm, cooperative.   LABORATORY DATA: Glucose 287, BUN of less than 5, potassium 3.4, calcium low at 6.9, triglycerides 3387, hemoglobin 13.0, hematocrit 37.7, WBC 15.0, platelets 422,000.   ASSESSMENT:  1.  Acute pancreatitis due to  hypertriglyceridemia.  2.  Hypertriglyceridemia, uncontrolled.  3.  Type 2 diabetes, uncontrolled.  4.  Morbid obesity, uncontrolled. 5.  Long-term use of insulin.  6.  Hypocalcemia.  7.  Hypokalemia.   RECOMMENDATIONS:  1.  Recommend we restart IV insulin to treat hypertriglyceridemia. Prior to insulin drip being stopped, it was being given at a rate of 8.5 units/hour. As Lantus was given just a short time ago, I plan to restart the IV regular insulin at 7 units/hour.  2.  Check blood sugars every hour.  3.  Continue low-dose IV fluids.  5.  Continue fibrate.  6.  Anticipate continuing the IV insulin until her triglycerides are less than 1000. This should give best prognosis for preventing immediate recurrence of acute pancreatitis.  7.  Strongly needs better control of her blood sugars. Needs close outpatient follow-up. Needs to check blood sugars regularly. Would really benefit from a low fat, low carbohydrate diet, of which she has not been adhering to lately.  8.  Liquid diet has been ordered, unfortunately this does contain sugar; therefore, I will modify her diet to not include any juice or sugar-containing liquids. Do not advance diet until IV insulin has been stopped and she is clinically much improved.  9.  Could consider transfer to ward, if primary team feels this is clinically appropriate, as insulin drips may be able to be managed on the regular ward.   The patient is critically ill. Time spent on this consultation greater than 45 minutes.   ____________________________ A. Wendall Mola, MD ams:sp D: 02/17/2015 17:44:30 ET T: 02/17/2015 17:56:52 ET JOB#: [id number]  cc: A. Wendall Mola, MD, <Dictator> Macy Mis MD ELECTRONICALLY SIGNED 02/25/2015 11:52

## 2015-03-29 NOTE — Discharge Summary (Signed)
PATIENT NAMJacqulynn Nguyen:  Nguyen, [name] MR#:  [id number] DATE OF BIRTH:  October 07, 1981  DATE OF ADMISSION:  02/16/2015 DATE OF DISCHARGE:  02/20/2015  DISCHARGE DIAGNOSES:  1. Severe hypertriglyceridemia.  2. Acute pancreatitis from hypertriglyceridemia.  3. Hyponatremia.   CONSULTATIONS: Endocrinology, Dr. Tedd SiasSolum; nephrology.  PROCEDURES: None.   CODE STATUS: Full code.   DISCHARGE MEDICATIONS: Lisinopril 10 mg 1 tablet p.o. once daily, metformin 1000 mg 1 tablet p.o. 2 times a day, Lipitor 10 mg once a day, pravastatin 40 mg p.o. at bedtime, Lantus 30 units subcutaneously 2 times a day, Novolin R 4 times a day per sliding scale, gemfibrozil 600 mg 1 tablet p.o. 2 times a day before meals, Tylenol 325 mg 2 tablets every 4 hours as needed for mild pain, Colace 100 mg 1 capsule p.o. 2 times a day as needed for constipation.   DIET: Low-sodium, low-fat, carbohydrate-controlled, regular consistency.   ACTIVITY: As tolerated.   FOLLOWUP APPOINTMENTS: In 1 week with primary care physician. Two weeks with endocrinology. Nephrology in 2-4 weeks as needed basis. Cardiology, Dr. Windell HummingbirdGollan's group in 2 weeks.   BRIEF HISTORY AND PHYSICAL AND HOSPITAL COURSE: The patient is a 34 year old pleasant Caucasian female who came into the ED with a chief complaint of abdominal pain. The pain was in the epigastric area, radiating to the back, around her side, associated with shortness of breath, feeling nauseous and dizzy. CAT scan of the chest was performed, as the patient was hypoxic and needing oxygen. That was negative for pulmonary embolism, but they have noticed some inflammatory changes in the left upper abdomen. Her blood was very lipemic, sodium was low, sugar was elevated. Ultrasound of the abdomen was done, which has revealed no acute findings, but hepatic steatosis. Hospitalist team is consulted for admission. Please review history and physical for details.   HOSPITAL COURSE BASED ON THE PROBLEM:  1. Severe acute  abdominal pain with severe hypertriglyceridemia causing acute pancreatitis, as the patient has leukocytosis, hypocalcemia, and low-grade temperature, it was graded as severe  pancreatitis. The patient was admitted to intensive care unit, aggressive hydration with intravenous fluids was provided. IV as well as p.o. pain medication were given for pain management. The patient was started on IV insulin drip as her triglycerides are greater than 5000.  2. The patient was complaining of abdominal pain with leukocytosis and hypertension. Empiric meropenem was ordered at the time of admission. Aggressive hydration with IV fluids and insulin drip, triglycerides started trending down. The patient was also evaluated by endocrinology and nephrology. Subsequently, the insulin drip was discontinued when triglycerides trended down to less than 1000. Empiric meropenem was discontinued, as her cultures was negative. Her abdominal pain is significantly improved. IV pain medications were switched to p.o. Clinical situation significantly improved. At the time of discharge, her triglycerides were in the 500 range. Her home medication gemfibrozil was resumed and the patient was recommended to follow up with outpatient cardiology for further work-up on her hypertriglyceridemia to rule out any kind of familial syndromes, including hyperchylomicronemia or other significant syndromes. This was discussed with Dr. Mariah MillingGollan on curbside. He has recommended the patient to follow up with him as an outpatient once clinically stable.  3. Acute hypoxic respiratory failure. With oxygen supplementation, hypoxia was subsequently resolved, and the patient was weaned off to room air.  4. Diarrhea. Stool studies were sent and it was negative for Clostridium difficile colitis and subsequently, diarrhea was also resolved.  5. Hyponatremia, likely from dehydration, as well as pseudohyponatremia  secondary to hyperlipidemia and hyperglycemia. The patient's  mentation was fine, resolved completely with IV fluids. The patient was followed up by nephrology during the hospital course. They have recommended to avoid overgenerous correction to prevent central pontine At the time of discharge, the patient's sodium was in the normal range.  6. Insulin-requiring diabetes mellitus with hyperglycemia. Fingerstick blood sugars were improved with insulin . Subsequently, insulin drip was discontinued and the patient's home dose Lantus 30 units subcutaneously twice a day was resumed. The patient was followed up by Dr. Tedd Sias, during the hospital course. Dr. Tedd Sias has recommended to discontinue her insulin drip once triglycerides are in the 1000 range. The patient has to follow up with Dr. Tedd Sias, endocrinology, as an outpatient after discharge.    DISPOSITION: Discharged home in a stable condition.   PHYSICAL EXAMINATION: VITAL SIGNS: Temperature 99, pulse 108, respirations 20, blood pressure 106/67, pulse oximetry of 92% on room air.  GENERAL APPEARANCE: Not in acute distress. Moderately built and obese.  HEENT: Normocephalic, atraumatic. Pupils are equally reacting to light and accommodation. No scleral icterus. No conjunctivitis. No sinus tenderness. No postnasal drip. Moist mucous membranes.  NECK: Supple. No JVD. No thyromegaly. Range of motion is intact.  LUNGS: Clear to auscultation bilaterally. No accessory muscle use and no anterior chest wall tenderness on palpation.  CARDIAC: S1, S2 normal. Regular rate and rhythm. No murmurs.  GASTROINTESTINAL: Soft. Bowel sounds are positive in all 4 quadrants. Nontender, nondistended, obese. No masses felt.  NEUROLOGIC: Awake, alert, oriented x 3. Cranial nerves II through XII are grossly intact. Motor and sensory are intact. Reflexes are 2+.  EXTREMITIES: No edema, no cyanosis, and no clubbing.  SKIN: Warm to touch. Normal turgor. No rashes. No lesions.  MUSCULOSKELETAL: No joint effusion, tenderness, or edema.   PSYCHIATRIC: Normal mood and affect.   LABORATORY AND IMAGING STUDIES: Ultrasound of the abdomen: This study was performed on March 21, no acute findings. Hepatic steatosis. CT angiogram of the chest for pulmonary embolism. This was performed on March 21 in view of hypoxia, negative for pulmonary embolism. Inflammatory changes in the left upper abdomen. The area is incompletely evaluated, but the inflammation could be coming from the pancreas. Consider dedicated image of, the abdomen and pelvis if needed. Patchy lung densities most likely related to atelectasis. Glucose 239, BUN 7, creatinine 0.36. Sodium, potassium, chloride, and CO2 are normal. Anion gap is 6, GFR greater than 60. Triglycerides on March 25, 635; WBC 10.0, hemoglobin 11.0, hematocrit 33.5, platelets are 310,000. MCV normal.   Diagnosis and plan of care was discussed in detail with the patient. She verbalized understanding of the plan.   TOTAL TIME SPENT: On  discharge and coordination of care is: 45 minutes.     ____________________________ Ramonita Lab, MD ag:mw D: 02/27/2015 13:38:00 ET T: 02/27/2015 14:29:54 ET JOB#: [id number]  cc: A. Wendall Mola, MD Antonieta Iba, MD Primary care physician Ramonita Lab, MD, <Dictator>   Ramonita Lab MD ELECTRONICALLY SIGNED 03/04/2015 12:17

## 2015-03-29 NOTE — Consult Note (Signed)
PATIENT NAME:  [name] CadetMCCALL, Athelene MR#:  [id number] DATE OF BIRTH:  Oct 03, 1981  DATE OF CONSULTATION:  02/17/2015  REFERRING PHYSICIAN:  Ramonita LabAruna Gouru, MD CONSULTING PHYSICIAN:  Bridgitt Raggio Lizabeth LeydenN. Tinsley Everman, MD  REASON FOR CONSULTATION: Hyponatremia.   HISTORY OF PRESENT ILLNESS: The patient is a pleasant 34 year old Caucasian female with past medical history of diabetes mellitus, multiple episodes of pancreatitis, history of chronic hypertriglyceridemia who presented [facility] with nausea as well as diarrhea. She reports that she developed nausea, abdominal pain, and diarrhea 1 day prior to admission. She reports that she had approximately 10 episodes of diarrhea prior to coming to the hospital. She had poor p.o. intake yesterday as well. She reports midepigastric abdominal pain that extends to her back. This is consistent with pain that she has had with prior episodes of pancreatitis. Lipase was found to be 91 upon initial presentation. Serum triglycerides were found to be greater than 5000 upon initial presentation. We are asked to see her for hyponatremia. Initial serum sodium was found to be 115. Upon repeat this was up to 120. She is currently receiving 0.9 normal saline at a rate of 250 mL per hour. I have requested the nurses stop this for now as her rate appears to be a bit fast.   PAST MEDICAL HISTORY: 1.  Diabetes mellitus.  2.  Multiple episodes of pancreatitis.  3.  Chronic history of hypertriglyceridemia.   ALLERGIES: No known drug allergies.   CURRENT INPATIENT MEDICATIONS: Include:  1.  Insulin drip.  2.  D5 at 50 mL/h.  3.  Normal saline 0.9 at 250 mL/h.  4.  Tylenol 650 mg p.o. q. 4 hours p.r.n. mild pain.  5.  Tylox 5/325 one to two tablets p.o. q. 4 hours p.r.n. moderate pain.  6.  Gemfibrozil 600 mg p.o. b.i.d.  7.  Hydromorphone 1 to 2 mg IV q. 4 hours p.r.n.  8.  Meropenem 1 gram IV q. 8 hours.  9.  Zofran 4 mg IV q. 4 hours p.r.n.  10.  Potassium chloride 20  mEq p.o. b.i.d.  11.  Zantac 150 mg p.o. q. 12 hours. 12.  Sliding scale insulin.   SOCIAL HISTORY: The patient denies tobacco use. Drinks alcohol occasionally. No illicit drug use. Currently unemployed.   FAMILY HISTORY: Positive for leukemia.   REVIEW OF SYSTEMS: CONSTITUTIONAL: Denies fevers, chills. Does have some anorexia now.  EYES: Denies diplopia, blurry vision.  HEENT: Denies headaches, hearing loss, tinnitus, epistaxis.  CARDIOVASCULAR: Denies chest pain, palpitations, PND.  RESPIRATORY: Endorses some shortness of breath when she takes a deep breath. Denies hemoptysis.  GASTROINTESTINAL: Reports nausea, diarrhea, midepigastric abdominal pain.  GENITOURINARY: Denies frequency, urgency, or dysuria.  MUSCULOSKELETAL: Denies joint pain, swelling or redness.  INTEGUMENTARY: Denies skin rashes or lesions.  NEUROLOGIC: Denies focal extremity numbness, weakness, or tingling.  PSYCHIATRIC: Denies depression, bipolar disorder.  ENDOCRINE: Denies polyuria or polydipsia at this time. Has history of diabetes mellitus. HEMATOLOGIC AND LYMPHATIC: Denies easy bruisability, bleeding, or swollen lymph nodes.  ALLERGY AND IMMUNOLOGIC: Denies seasonal allergies or history of immunodeficiency.   PHYSICAL EXAMINATION: VITAL SIGNS: Temperature 99.5, pulse 114, respirations 26, blood pressure 122/83, pulse ox 96% on 2 liters.  GENERAL: Well-developed, well-nourished Caucasian female who appears her stated age, currently in no acute distress.  HEENT: Normocephalic, atraumatic. Extraocular movements are intact. Pupils equal, round and reactive to light. No scleral icterus. Conjunctivae are pink. No epistaxis noted. Gross hearing intact. Oral mucosa moist.  NECK: Supple without  JVD or adenopathy.  LUNGS: Clear to auscultation bilaterally with normal respiratory effort.  HEART: S1 and S2. The patient noted to be tachycardic. No murmurs or rubs appreciated.  ABDOMEN: Soft. There is midepigastric  tenderness noted. Mild rebound tenderness. Some guarding noted as well. Bowel sounds are present.  EXTREMITIES: No clubbing, cyanosis or edema.  NEUROLOGIC: The patient is awake, alert, and oriented to time, person, and place. Strength is 5/5 in both upper and lower extremities.  SKIN: Warm and dry. No rashes noted.  GENITOURINARY: No suprapubic tenderness is noted at this time.  MUSCULOSKELETAL: No joint redness, swelling or tenderness appreciated.  PSYCHIATRIC: The patient with appropriate affect and appears to have good insight into her current illness.   DIAGNOSTIC DATA: Sodium 120 potassium 3.7, chloride 89, CO2 16, BUN 5, creatinine 0.37, calcium 7.1. Cholesterol 874, triglycerides greater than 5000. Lipase 91. Hemoglobin A1c 10.  Albumin 4.3. CBC shows WBC 15, hemoglobin 13, hematocrit 37, platelets 422,000.   Urinalysis shows urine glucose greater than 500 mg/dL, urine protein greater than 500 mg/dL, 3 RBCs per high-power field, 4 WBCs per high-power field.   IMPRESSION: This is a 34 year old Caucasian female with past medical history of diabetes mellitus, pancreatitis with multiple episodes, and hypertriglyceridemia who presented to [facility] with abdominal pain, nausea and diarrhea and found to have hyponatremia.   PROBLEM LIST: 1.  Hyponatremia, most likely hypovolemic in nature.  2.  Pancreatitis.  3.  Hypertriglyceridemia.  4.  Proteinuria.   PLAN: The patient presented with pancreatitis secondary to hypertriglyceridemia. Management of this per the hospitalist. We are consulted for the evaluation and management of hyponatremia. We feel this hypovolemic in nature as she was having multiple episodes of diarrhea. We do not know her current sodium, however, most recent sodium was at 120. When we evaluated her she was on normal saline at 250 mL/h. We would slow this down. We will reduce 0.9 normal saline to 40 mL/h and check serum sodium q. 4 to 6 hours. We are  currently awaiting repeat sodium. Target sodium for today is 123 to 125. We will adjust her fluids accordingly. She was also found to have proteinuria, which is most likely due to her underlying diabetes and potentially early diabetic nephropathy. The patient's hemoglobin A1c was found to be quite high at 10. Management of hypertriglyceridemia per hospitalist.   I would like to thank Dr. Amado Coe for this kind referral.  ____________________________ Lennox Pippins, MD mnl:sb D: 02/17/2015 10:30:15 ET T: 02/17/2015 10:56:36 ET JOB#: [id number]  cc: Lennox Pippins, MD, <Dictator> Ria Comment Kullen Tomasetti MD ELECTRONICALLY SIGNED 03/17/2015 9:52

## 2015-03-29 NOTE — H&P (Signed)
PATIENT NAME:  [name], [name] MR#:  [id number] DATE OF BIRTH:  1981-02-25  DATE OF ADMISSION:  02/16/2015  PRIMARY CARE PHYSICIAN:   At [facility] in [place].  CHIEF COMPLAINT: Abdominal pain.   HISTORY OF PRESENT ILLNESS: This is a 34 year old female who developed severe stabbing abdominal pain that woke her up from sleep this morning at 4:00, severe in nature, 6/10 intensity, radiating into the back and around her sides, associated with shortness of breath. When she lies back she gets nauseous and dizzy.  She has been having diarrhea, 10 times last night, also developed some chest pain. She has a history of pancreatitis and high triglycerides and she feels that this is a pancreatitis. In the ER, the ER physician was concerned about her requiring oxygen and he did a CT scan of the chest, which was negative for pulmonary embolism and they saw inflammatory changes in the left upper abdomen. Her blood was very lipemic. Her sodium was low, sugar was up. White count was up and calcium was down. Ultrasound of the abdomen showed no acute findings, hepatic steatosis. Hospitalist services were contacted for further evaluation.   PAST MEDICAL HISTORY: Diabetes, pancreatitis x 3. Hypertriglyceridemia.   PAST SURGICAL HISTORY: Knee surgery.   ALLERGIES: No known drug allergies.   SOCIAL HISTORY: No smoking. Rare alcohol. No drug use. Not working at this time.   FAMILY HISTORY: Father unknown medical history. Mother with mental issues. Grandmother and aunt had leukemia, uncle with cancer.   MEDICATIONS: Include Lantus 30 units subcutaneous injection b.i.d., Novolin R sliding scale, glipizide 10 mg in the a.m., gemfibrozil 600 mg b.i.d., metformin 1000 mg b.i.d., lisinopril 5 mg daily.   REVIEW OF SYSTEMS:  CONSTITUTIONAL: No fever, chills, or sweats. No weight loss. No weight gain. Positive for weakness over the past 1-1/2 weeks. She has been exhausted, lying in bed.  EYES: No blurry  vision.  EARS, NOSE, MOUTH AND THROAT: Positive for sinus problems. No sore throat. No difficulty swallowing.  CARDIOVASCULAR: Positive for chest pain. No palpitations.  RESPIRATORY: Positive for shortness of breath. No cough. No sputum. No hemoptysis.  GASTROINTESTINAL: Positive for nausea with lying down. Positive for severe abdominal pain. Positive for diarrhea. No bright red blood per rectum. No melena.  GENITOURINARY: No burning on urination, no hematuria.  MUSCULOSKELETAL: No joint pain or muscle pain.  INTEGUMENT: No rashes or eruptions.  NEUROLOGIC: No fainting or blackouts.  PSYCHIATRIC: No anxiety or depression.  ENDOCRINE: No thyroid problems.  HEMATOLOGIC AND LYMPHATIC: No anemia, no easy bruising, or bleeding.   PHYSICAL EXAMINATION: VITAL SIGNS: Temperature 99.3, pulse 112, respirations 18, blood pressure 100/80, pulse oximetry 96% on oxygen. When she came into the ER pulse oximetry anywhere from 90%-93% on oxygen.  GENERAL: No respiratory distress, sitting up in bed.  EYES: Conjunctivae and lids normal. Pupils equal, round, and reactive to light. Extraocular muscles intact. No nystagmus.  EARS, NOSE, MOUTH, AND THROAT: Tympanic membranes, no erythema. Nasal mucosa, no erythema. Throat, no erythema. No exudate seen. Lips and gums, no lesions.  NECK: No JVD. No bruits. No lymphadenopathy. No thyromegaly. No thyroid nodules palpated.  RESPIRATORY:  Lungs clear to auscultation. No use of accessory muscles to breathe. No rhonchi, rales, or wheeze heard.  CARDIOVASCULAR: S1 and S2, tachycardic. No gallops, rubs, or murmurs heard. Carotid upstroke 2+ bilaterally. No bruits. Dorsalis pedis pulses 2+ bilaterally. No edema of the lower extremity.  ABDOMEN: Soft. Positive tenderness in the epigastric and left upper quadrant  area. No organomegaly/splenomegaly. Normoactive bowel sounds. No masses felt.  LYMPHATIC: No lymph nodes in the neck.  MUSCULOSKELETAL: No clubbing, edema, cyanosis.   SKIN: No rashes or ulcers seen.  NEUROLOGIC: Cranial nerves II-XII grossly intact. Deep tendon reflexes 2+ bilateral lower extremities.  PSYCHIATRIC: The patient is oriented to person, place, and time.   LABORATORY AND RADIOLOGICAL DATA: Beta-hCG less than 1, lipase 91, which is slightly elevated. Glucose 383, BUN 5, creatinine was not calculated on the first test, sodium 115, potassium 3.8, chloride 83, CO2 of 16, calcium 7.5, albumin 4.3. White blood cell count 17.8, H and H, 15.5 and 42.2, platelet count of 542,000.  Urinalysis, 2+ blood, 2+ ketones, greater than 500 mg/dL of glucose. Ultrasound of the abdomen showed hepatic steatosis. Repeat chemistry showed a glucose of 326. Creatinine was 0.37, sodium 120, potassium 3.7, chloride 89, CO2 of 16, calcium 7.1. CT scan of the chest, negative for pulmonary embolism, inflammatory changes left upper abdomen. Venous pH 7.34.   ASSESSMENT AND PLAN: 1.  Severe abdominal pain, with the patient's history of pancreatitis and hypertriglyceridemia,  the patient also has leukocytosis, hypocalcemia and a low-grade temperature. This could be a severe pancreatitis. I will give vigorous intravenous fluid hydration,  intravenous and p.o. pain control, intravenous insulin drip with a history of hypertriglyceridemia and critical care unit monitoring. Empiric meropenem ordered. 2.  Acute respiratory failure with hypoxia on room air. We will give oxygen supplementation.  3.  Diarrhea. Will send off stool studies.  4.  Hypertriglyceridemia. We will check a lipid profile. Continue gemfibrozil; insulin drip should help.  5.  Hyponatremia, likely with the high sugars. Will give intravenous fluid hydration and continue to monitor.   TIME SPENT ON ADMISSION: 55 minutes. The patient was admitted to the critical care unit secondary to likely severe pancreatitis.   ____________________________ Herschell Dimesichard J. Renae GlossWieting, MD rjw:LT D: 02/16/2015 17:30:09 ET T: 02/16/2015 18:05:06  ET JOB#: [id number]  cc: Herschell Dimesichard J. Renae GlossWieting, MD, <Dictator> [facility] [place] Makari Portman J Jetta Murray MD ELECTRONICALLY SIGNED 02/17/2015 17:31

## 2015-03-29 NOTE — Discharge Summary (Signed)
PATIENT NAMJacqulynn Nguyen:  Paulding, [name]:  [id number] DATE OF BIRTH:  October 07, 1981  DATE OF ADMISSION:  02/16/2015 DATE OF DISCHARGE:  02/20/2015  DISCHARGE DIAGNOSES:  1. Severe hypertriglyceridemia.  2. Acute pancreatitis from hypertriglyceridemia.  3. Hyponatremia.   CONSULTATIONS: Endocrinology, Dr. Tedd SiasSolum; nephrology.  PROCEDURES: None.   CODE STATUS: Full code.   DISCHARGE MEDICATIONS: Lisinopril 10 mg 1 tablet p.o. once daily, metformin 1000 mg 1 tablet p.o. 2 times a day, Lipitor 10 mg once a day, pravastatin 40 mg p.o. at bedtime, Lantus 30 units subcutaneously 2 times a day, Novolin R 4 times a day per sliding scale, gemfibrozil 600 mg 1 tablet p.o. 2 times a day before meals, Tylenol 325 mg 2 tablets every 4 hours as needed for mild pain, Colace 100 mg 1 capsule p.o. 2 times a day as needed for constipation.   DIET: Low-sodium, low-fat, carbohydrate-controlled, regular consistency.   ACTIVITY: As tolerated.   FOLLOWUP APPOINTMENTS: In 1 week with primary care physician. Two weeks with endocrinology. Nephrology in 2-4 weeks as needed basis. Cardiology, Dr. Windell HummingbirdGollan's group in 2 weeks.   BRIEF HISTORY AND PHYSICAL AND HOSPITAL COURSE: The patient is a 34 year old pleasant Caucasian female who came into the ED with a chief complaint of abdominal pain. The pain was in the epigastric area, radiating to the back, around her side, associated with shortness of breath, feeling nauseous and dizzy. CAT scan of the chest was performed, as the patient was hypoxic and needing oxygen. That was negative for pulmonary embolism, but they have noticed some inflammatory changes in the left upper abdomen. Her blood was very lipemic, sodium was low, sugar was elevated. Ultrasound of the abdomen was done, which has revealed no acute findings, but hepatic steatosis. Hospitalist team is consulted for admission. Please review history and physical for details.   HOSPITAL COURSE BASED ON THE PROBLEM:  1. Severe acute  abdominal pain with severe hypertriglyceridemia causing acute pancreatitis, as the patient has leukocytosis, hypocalcemia, and low-grade temperature, it was graded as severe  pancreatitis. The patient was admitted to intensive care unit, aggressive hydration with intravenous fluids was provided. IV as well as p.o. pain medication were given for pain management. The patient was started on IV insulin drip as her triglycerides are greater than 5000.  2. The patient was complaining of abdominal pain with leukocytosis and hypertension. Empiric meropenem was ordered at the time of admission. Aggressive hydration with IV fluids and insulin drip, triglycerides started trending down. The patient was also evaluated by endocrinology and nephrology. Subsequently, the insulin drip was discontinued when triglycerides trended down to less than 1000. Empiric meropenem was discontinued, as her cultures was negative. Her abdominal pain is significantly improved. IV pain medications were switched to p.o. Clinical situation significantly improved. At the time of discharge, her triglycerides were in the 500 range. Her home medication gemfibrozil was resumed and the patient was recommended to follow up with outpatient cardiology for further work-up on her hypertriglyceridemia to rule out any kind of familial syndromes, including hyperchylomicronemia or other significant syndromes. This was discussed with Dr. Mariah MillingGollan on curbside. He has recommended the patient to follow up with him as an outpatient once clinically stable.  3. Acute hypoxic respiratory failure. With oxygen supplementation, hypoxia was subsequently resolved, and the patient was weaned off to room air.  4. Diarrhea. Stool studies were sent and it was negative for Clostridium difficile colitis and subsequently, diarrhea was also resolved.  5. Hyponatremia, likely from dehydration, as well as pseudohyponatremia  secondary to hyperlipidemia and hyperglycemia. The patient's  mentation was fine, resolved completely with IV fluids. The patient was followed up by nephrology during the hospital course. They have recommended to avoid overgenerous correction to prevent central pontine (Dictation Anomaly)<<MISSING TEXT>>. At the time of discharge, the patient's sodium was in the normal range.  6. Insulin-requiring diabetes mellitus with hyperglycemia. Fingerstick blood sugars were improved with insulin (Dictation Anomaly)<<MISSING TEXT>>. Subsequently, insulin drip was discontinued and the patient's home dose Lantus 30 units subcutaneously twice a day was resumed. The patient was followed up by Dr. Tedd SiasSolum, during the hospital course. Dr. Tedd Siassolum has recommended to discontinue her insulin drip once triglycerides are in the 1000 range. The patient has to follow up with Dr. Tedd SiasSolum, endocrinology, as an outpatient after discharge.    DISPOSITION: Discharged home in a stable condition.   PHYSICAL EXAMINATION: VITAL SIGNS: Temperature 99, pulse 108, respirations 20, blood pressure 106/67, pulse oximetry of 92% on room air.  GENERAL APPEARANCE: Not in acute distress. Moderately built and obese.  HEENT: Normocephalic, atraumatic. Pupils are equally reacting to light and accommodation. No scleral icterus. No conjunctivitis. No sinus tenderness. No postnasal drip. Moist mucous membranes.  NECK: Supple. No JVD. No thyromegaly. Range of motion is intact.  LUNGS: Clear to auscultation bilaterally. No accessory muscle use and no anterior chest wall tenderness on palpation.  CARDIAC: S1, S2 normal. Regular rate and rhythm. No murmurs.  GASTROINTESTINAL: Soft. Bowel sounds are positive in all 4 quadrants. Nontender, nondistended, obese. No masses felt.  NEUROLOGIC: Awake, alert, oriented x 3. Cranial nerves II through XII are grossly intact. Motor and sensory are intact. Reflexes are 2+.  EXTREMITIES: No edema, no cyanosis, and no clubbing.  SKIN: Warm to touch. Normal turgor. No rashes. No  lesions.  MUSCULOSKELETAL: No joint effusion, tenderness, or edema.  PSYCHIATRIC: Normal mood and affect.   LABORATORY AND IMAGING STUDIES: Ultrasound of the abdomen: This study was performed on March 21, no acute findings. Hepatic steatosis. CT angiogram of the chest for pulmonary embolism. This was performed on March 21 in view of hypoxia, negative for pulmonary embolism. Inflammatory changes in the left upper abdomen. The area is incompletely evaluated, but the inflammation could be coming from the pancreas. Consider dedicated image of, the abdomen and pelvis if needed. Patchy lung densities most likely related to atelectasis. Glucose 239, BUN 7, creatinine 0.36. Sodium, potassium, chloride, and CO2 are normal. Anion gap is 6, GFR greater than 60. Triglycerides on March 25, 635; WBC 10.0, hemoglobin 11.0, hematocrit 33.5, platelets are 310,000. MCV normal.   Diagnosis and plan of care was discussed in detail with the patient. She verbalized understanding of the plan.   TOTAL TIME SPENT: On  discharge and coordination of care is: 45 minutes.     ____________________________ Ramonita LabAruna Narcissus Detwiler, MD ag:mw D: 02/27/2015 13:38:11 ET T: 02/27/2015 14:29:54 ET JOB#: [id number]  cc: Ramonita LabAruna Ercia Crisafulli, MD, <Dictator> A. Wendall MolaMelissa Solum, MD Antonieta Ibaimothy J. Gollan, MD Primary care physician

## 2015-06-30 ENCOUNTER — Ambulatory Visit: Payer: Self-pay | Attending: Internal Medicine

## 2015-07-01 LAB — CBC WITH DIFFERENTIAL/PLATELET
BASOS PCT: 0.5 %
Basophil #: 0.1 10*3/uL (ref 0.0–0.1)
Eosinophil #: 0.1 10*3/uL (ref 0.0–0.7)
Eosinophil %: 0.5 %
HCT: 35.8 % (ref 35.0–47.0)
HGB: 12.1 g/dL (ref 12.0–16.0)
LYMPHS ABS: 1.6 10*3/uL (ref 1.0–3.6)
Lymphocyte %: 14 %
MCH: 28.4 pg (ref 26.0–34.0)
MCHC: 33.9 g/dL (ref 32.0–36.0)
MCV: 84 fL (ref 80–100)
Monocyte #: 0.5 x10 3/mm (ref 0.2–0.9)
Monocyte %: 4.3 %
NEUTROS ABS: 9.4 10*3/uL — AB (ref 1.4–6.5)
NEUTROS PCT: 80.7 %
PLATELETS: 296 10*3/uL (ref 150–440)
RBC: 4.27 10*6/uL (ref 3.80–5.20)
RDW: 13.9 % (ref 11.5–14.5)
WBC: 11.7 10*3/uL — ABNORMAL HIGH (ref 3.6–11.0)

## 2015-07-01 LAB — BASIC METABOLIC PANEL
Anion Gap: 8 (ref 7–16)
Anion Gap: 9 (ref 7–16)
CALCIUM: 7.6 mg/dL — AB
CHLORIDE: 100 mmol/L — AB
CO2: 24 mmol/L
CREATININE: 0.41 mg/dL — AB
Calcium, Total: 7.6 mg/dL — ABNORMAL LOW
Chloride: 101 mmol/L
Co2: 25 mmol/L
Creatinine: 0.35 mg/dL — ABNORMAL LOW
EGFR (African American): >60
EGFR (African American): >60
EGFR (Non-African Amer.): >60
Glucose: 159 mg/dL — ABNORMAL HIGH
Glucose: 169 mg/dL — ABNORMAL HIGH
POTASSIUM: 3.3 mmol/L — AB
Potassium: 3.4 mmol/L — ABNORMAL LOW
Sodium: 133 mmol/L — ABNORMAL LOW
Sodium: 134 mmol/L — ABNORMAL LOW

## 2015-07-01 LAB — BASIC METABOLIC PANEL WITH GFR
BUN: <5 mg/dL — ABNORMAL LOW
Calcium, Total: 7.3 mg/dL — ABNORMAL LOW
Chloride: 102 mmol/L
Co2: 25 mmol/L
Creatinine: 0.45 mg/dL
EGFR (African American): >60
EGFR (Non-African Amer.): >60
Glucose: 142 mg/dL — ABNORMAL HIGH
Potassium: 3.1 mmol/L — ABNORMAL LOW

## 2015-07-01 LAB — TRIGLYCERIDES
TRIGLYCERIDES: 1315 mg/dL — AB
Triglycerides: 1459 mg/dL — ABNORMAL HIGH

## 2016-04-05 ENCOUNTER — Inpatient Hospital Stay
Admission: EM | Admit: 2016-04-05 | Discharge: 2016-04-11 | DRG: 440 | Disposition: A | Discharge status: Home / Self Care | Payer: Self-pay | Attending: Internal Medicine | Admitting: Internal Medicine

## 2016-04-05 ENCOUNTER — Encounter: Payer: Self-pay | Admitting: Emergency Medicine

## 2016-04-05 DIAGNOSIS — E781 Pure hyperglyceridemia: Secondary | ICD-10-CM | POA: Diagnosis present

## 2016-04-05 DIAGNOSIS — K858 Other acute pancreatitis without necrosis or infection: Secondary | ICD-10-CM

## 2016-04-05 DIAGNOSIS — Z833 Family history of diabetes mellitus: Secondary | ICD-10-CM

## 2016-04-05 DIAGNOSIS — K859 Acute pancreatitis without necrosis or infection, unspecified: Principal | ICD-10-CM | POA: Diagnosis present

## 2016-04-05 DIAGNOSIS — E669 Obesity, unspecified: Secondary | ICD-10-CM | POA: Diagnosis present

## 2016-04-05 DIAGNOSIS — E785 Hyperlipidemia, unspecified: Secondary | ICD-10-CM | POA: Diagnosis present

## 2016-04-05 DIAGNOSIS — Z599 Problem related to housing and economic circumstances, unspecified: Secondary | ICD-10-CM

## 2016-04-05 DIAGNOSIS — Z794 Long term (current) use of insulin: Secondary | ICD-10-CM

## 2016-04-05 DIAGNOSIS — I10 Essential (primary) hypertension: Secondary | ICD-10-CM | POA: Diagnosis present

## 2016-04-05 DIAGNOSIS — E119 Type 2 diabetes mellitus without complications: Secondary | ICD-10-CM | POA: Diagnosis present

## 2016-04-05 DIAGNOSIS — E7800 Pure hypercholesterolemia, unspecified: Secondary | ICD-10-CM | POA: Diagnosis present

## 2016-04-05 DIAGNOSIS — Z8249 Family history of ischemic heart disease and other diseases of the circulatory system: Secondary | ICD-10-CM

## 2016-04-05 DIAGNOSIS — Z6836 Body mass index (BMI) 36.0-36.9, adult: Secondary | ICD-10-CM

## 2016-04-05 DIAGNOSIS — Z87891 Personal history of nicotine dependence: Secondary | ICD-10-CM

## 2016-04-05 DIAGNOSIS — E860 Dehydration: Secondary | ICD-10-CM | POA: Diagnosis present

## 2016-04-05 DIAGNOSIS — E876 Hypokalemia: Secondary | ICD-10-CM | POA: Diagnosis present

## 2016-04-05 DIAGNOSIS — Z79899 Other long term (current) drug therapy: Secondary | ICD-10-CM

## 2016-04-05 DIAGNOSIS — Z9114 Patient's other noncompliance with medication regimen: Secondary | ICD-10-CM

## 2016-04-05 DIAGNOSIS — E1165 Type 2 diabetes mellitus with hyperglycemia: Secondary | ICD-10-CM | POA: Diagnosis present

## 2016-04-05 HISTORY — DX: Acute pancreatitis without necrosis or infection, unspecified: K85.90

## 2016-04-05 LAB — COMPREHENSIVE METABOLIC PANEL
ALT: 12 U/L — AB (ref 14–54)
AST: 14 U/L — AB (ref 15–41)
Albumin: 3.6 g/dL (ref 3.5–5.0)
Alkaline Phosphatase: 52 U/L (ref 38–126)
Anion gap: 18 — ABNORMAL HIGH (ref 5–15)
BILIRUBIN TOTAL: 1.1 mg/dL (ref 0.3–1.2)
BUN: 8 mg/dL (ref 6–20)
CALCIUM: 8.9 mg/dL (ref 8.9–10.3)
CO2: 15 mmol/L — ABNORMAL LOW (ref 22–32)
CREATININE: 0.56 mg/dL (ref 0.44–1.00)
Chloride: 101 mmol/L (ref 101–111)
Glucose, Bld: 402 mg/dL — ABNORMAL HIGH (ref 65–99)
Potassium: 4.4 mmol/L (ref 3.5–5.1)
Sodium: 134 mmol/L — ABNORMAL LOW (ref 135–145)
TOTAL PROTEIN: 6.8 g/dL (ref 6.5–8.1)

## 2016-04-05 LAB — URINALYSIS COMPLETE WITH MICROSCOPIC (ARMC ONLY)
BILIRUBIN URINE: NEGATIVE
Glucose, UA: >500 mg/dL — AB
LEUKOCYTES UA: NEGATIVE
NITRITE: NEGATIVE
Protein, ur: >500 mg/dL — AB
SPECIFIC GRAVITY, URINE: 1.036 — AB (ref 1.005–1.030)
pH: 5 (ref 5.0–8.0)

## 2016-04-05 LAB — LIPID PANEL
CHOLESTEROL: 1000 mg/dL — AB (ref 0–200)
LDL Cholesterol: UNDETERMINED mg/dL (ref 0–99)
Triglycerides: >5000 mg/dL — ABNORMAL HIGH (ref <150)
VLDL: UNDETERMINED mg/dL (ref 0–40)

## 2016-04-05 LAB — LIPASE, BLOOD: Lipase: 538 U/L — ABNORMAL HIGH (ref 11–51)

## 2016-04-05 MED ORDER — SODIUM CHLORIDE 0.9 % IV BOLUS (SEPSIS)
1000.0000 mL | INTRAVENOUS | Status: AC
  Administered 2016-04-05: 1000 mL via INTRAVENOUS

## 2016-04-05 MED ORDER — ONDANSETRON HCL 4 MG/2ML IJ SOLN
4.0000 mg | INTRAMUSCULAR | Status: AC
  Administered 2016-04-05: 4 mg via INTRAVENOUS
  Filled 2016-04-05: qty 2

## 2016-04-05 MED ORDER — MORPHINE SULFATE (PF) 4 MG/ML IV SOLN
4.0000 mg | Freq: Once | INTRAVENOUS | Status: AC
  Administered 2016-04-05: 4 mg via INTRAVENOUS
  Filled 2016-04-05: qty 1

## 2016-04-05 MED ORDER — SODIUM CHLORIDE 0.9 % IV SOLN
1000.0000 mL | Freq: Once | INTRAVENOUS | Status: AC
  Administered 2016-04-05: 1000 mL via INTRAVENOUS

## 2016-04-05 NOTE — ED Provider Notes (Signed)
[facility] Emergency Department Provider Note  ____________________________________________  Time seen: Approximately 10:12 PM  I have reviewed the triage vital signs and the nursing notes.   HISTORY  Chief Complaint Abdominal Pain    HPI [name] is a 35 y.o. female with a history of multiple episodes of pancreatitis due to high triglycerides who presents with gradual onset but worsening abdominal pain and nausea that feels similar to her prior episodes of pancreatitis.  Started about a week ago with general malaise, fatigue, and nausea.  Abd pain has developed slowly over time and is now severe, centrally located, and radiating to her back.  Denies vomiting but has no appetite and has decreased PO intake.Denies fever/chills, CP, SOB, dysuria.  Position sometimes helps her feel better.  Movement makes her worse.  Symptoms are severe.   Past Medical History  Diagnosis Date  . Diabetes mellitus   . Hypertension   . Hypercholesteremia   . Pancreatitis     Patient Active Problem List   Diagnosis Date Noted  . Acute pancreatitis 04/06/2016  . Dehydration 04/06/2016  . Other specified diabetes mellitus without complications (HCC) 10/21/2014  . History of pancreatitis 10/21/2014  . Essential hypertension 10/21/2014  . Hypertriglyceridemia 10/21/2014  . LACERATION OF FINGER 05/04/2009    Past Surgical History  Procedure Laterality Date  . Knee surgery      Current Outpatient Rx  Name  Route  Sig  Dispense  Refill  . acetaminophen (TYLENOL) 500 MG tablet   Oral   Take 1,000-1,500 mg by mouth every 6 (six) hours as needed. For pain.         Marland Kitchen gemfibrozil (LOPID) 600 MG tablet   Oral   Take 600 mg by mouth 2 (two) times daily before a meal.         . insulin aspart (NOVOLOG) 100 UNIT/ML injection   Subcutaneous   Inject 2 Units into the skin 2 (two) times daily before a meal. 2 units for blood sugar 151-200, 4 unit for 201-250,6 unit  for 251-300,8 unit for 301-350, 10 units for 351-400         . insulin glargine (LANTUS) 100 UNIT/ML injection   Subcutaneous   Inject 45 Units into the skin daily.          . Liraglutide (VICTOZA) 18 MG/3ML SOPN   Subcutaneous   Inject 1.8 mg into the skin daily.         Marland Kitchen lisinopril (PRINIVIL,ZESTRIL) 10 MG tablet   Oral   Take 10 mg by mouth daily.         . pravastatin (PRAVACHOL) 10 MG tablet   Oral   Take 40 mg by mouth daily.          . Saxagliptin-Metformin (KOMBIGLYZE XR) 2.03-999 MG TB24   Oral   Take 1 tablet by mouth daily.           Allergies Review of patient's allergies indicates no known allergies.  Family History  Problem Relation Age of Onset  . Cancer Other   . Diabetes Other   . Hypertension Other   . Cancer Paternal Grandfather     Social History Social History  Substance Use Topics  . Smoking status: Former Games developer  . Smokeless tobacco: None  . Alcohol Use: No    Review of Systems Constitutional: Denies fever/chills Eyes: No visual changes. ENT: No sore throat. Cardiovascular: Denies chest pain. Respiratory: Denies shortness of breath. Gastrointestinal: +abdominal pain.  +nausea,  no vomiting.  No diarrhea.  No constipation. Genitourinary: Negative for dysuria. Musculoskeletal: Abd pain radiates to back Skin: Negative for rash. Neurological: Negative for headaches, focal weakness or numbness.  10-point ROS otherwise negative.  ____________________________________________   PHYSICAL EXAM:  VITAL SIGNS: ED Triage Vitals  Enc Vitals Group     BP 04/05/16 1900 113/74 mmHg     Pulse Rate 04/05/16 1900 124     Resp 04/05/16 1900 20     Temp 04/05/16 1900 100.2 F (37.9 C)     Temp Source 04/05/16 1900 Oral     SpO2 04/05/16 1900 94 %     Weight 04/05/16 1900 230 lb (104.327 kg)     Height 04/05/16 1900 5\' 4"  (1.626 m)     Head Cir --      Peak Flow --      Pain Score 04/05/16 1901 6     Pain Loc --      Pain Edu?  --      Excl. in GC? --     Constitutional: Alert and oriented. Well appearing and in no acute distress. Eyes: Conjunctivae are normal. PERRL. EOMI. Head: Atraumatic. Nose: No congestion/rhinnorhea. Mouth/Throat: Mucous membranes are moist.  Oropharynx non-erythematous. Neck: No stridor.  No meningeal signs.   Cardiovascular: Tachycardia, regular rhythm. Good peripheral circulation. Grossly normal heart sounds.   Respiratory: Normal respiratory effort.  No retractions. Lungs CTAB. Gastrointestinal: Soft, severe TTP of middle of abdomen.  No specific focality. Musculoskeletal: No lower extremity tenderness nor edema. No gross deformities of extremities. Neurologic:  Normal speech and language. No gross focal neurologic deficits are appreciated.  Skin:  Skin is warm, dry and intact. No rash noted. Psychiatric: Mood and affect are normal. Speech and behavior are normal.  ____________________________________________   LABS (all labs ordered are listed, but only abnormal results are displayed)  Labs Reviewed  LIPASE, BLOOD - Abnormal; Notable for the following:    Lipase 538 (*)    All other components within normal limits  COMPREHENSIVE METABOLIC PANEL - Abnormal; Notable for the following:    Sodium 134 (*)    CO2 15 (*)    Glucose, Bld 402 (*)    AST 14 (*)    ALT 12 (*)    Anion gap 18 (*)    All other components within normal limits  CBC - Abnormal; Notable for the following:    WBC 20.6 (*)    Hemoglobin 19.8 (*)    MCH 47.5 (*)    MCHC 55.8 (*)    Platelets 569 (*)    All other components within normal limits  URINALYSIS COMPLETEWITH MICROSCOPIC ([facility] ONLY) - Abnormal; Notable for the following:    Color, Urine YELLOW (*)    APPearance HAZY (*)    Glucose, UA >500 (*)    Ketones, ur 2+ (*)    Specific Gravity, Urine 1.036 (*)    Hgb urine dipstick 1+ (*)    Protein, ur >500 (*)    Bacteria, UA RARE (*)    Squamous Epithelial / LPF 0-5 (*)    All other components  within normal limits  LACTIC ACID, PLASMA - Abnormal; Notable for the following:    Lactic Acid, Venous 4.4 (*)    All other components within normal limits  LIPID PANEL - Abnormal; Notable for the following:    Cholesterol 1000 (*)    Triglycerides >5000 (*)    All other components within normal limits  LACTIC ACID, PLASMA  ____________________________________________  EKG  None ____________________________________________  RADIOLOGY   No results found.  ____________________________________________   PROCEDURES  Procedure(s) performed: None  Critical Care performed: No ____________________________________________   INITIAL IMPRESSION / ASSESSMENT AND PLAN / ED COURSE  Pertinent labs & imaging results that were available during my care of the patient were reviewed by me and considered in my medical decision making (see chart for details).  The patient does meet sepsis criteria, but given the high probability that her issue is pancreatitis and not infection per se, I am not activating code sepsis because I do not believe she is likely to benefit from empiric antibiotics.  I am giving her 2 liters of fluid.  Her labs are so lipemic that the samples were sent by courier to [facility] for analysis.  I called at 10:37 PM and verified that the samples are at [facility] of being process.  I also asked them to add on a lipid panel.  I am holding off on imaging at this time because she has had normal imaging in the past, it is unlikely to be helpful at this stage and treating pancreatitis, and because I do not yet know what her creatinine is.  I verified that she has had an ultrasound in the past that showed no evidence of gallstones so I do not believe an ultrasound would be helpful at this time.  Given her morphine and Zofran as well as IV fluids.  ----------------------------------------- 12:22 AM on 04/06/2016 -----------------------------------------  The patient's labs have  resulted and they show extremely high triglycerides and cholesterol, and essentially normal chemistry, and probable hemoconcentration/dehydration given the very high hemoglobin and leukocytosis.  Her lactate is elevated as well, but I strongly doubt that this is the result of sepsis as opposed to acute pancreatitis and volume depletion.  I discussed the case with the admitting hospitalist, Dr. Anne Hahn, who agrees with my assessment and also does not want to initiate sepsis protocol nor empiric antibiotics.  We will continue with aggressive fluid resuscitation.  I will obtain a complete abdominal ultrasound to evaluate the gallbladder as well as the pancreas to investigate for possible pseudocyst, but given that she has had a normal ultrasound in the past with no evidence of cholelithiasis and CT scans that have only shown acute pancreatitis without any necrosis nor pseudocyst in the past, I anticipate that the study will likely be normal.  Dr. Anne Hahn will continue management from here including ordering insulin and a dextrose drip to help with the triglycerides.  I updated the patient and her mother who understand and agree with the plan of care. ____________________________________________  FINAL CLINICAL IMPRESSION(S) / ED DIAGNOSES  Final diagnoses:  Other acute pancreatitis  Dehydration  Hyperlipidemia     MEDICATIONS GIVEN DURING THIS VISIT:  Medications  0.9 %  sodium chloride infusion (not administered)  0.9 %  sodium chloride infusion (1,000 mLs Intravenous New Bag/Given 04/05/16 2118)  morphine 4 MG/ML injection 4 mg (4 mg Intravenous Given 04/05/16 2252)  sodium chloride 0.9 % bolus 1,000 mL (1,000 mLs Intravenous New Bag/Given 04/05/16 2356)  ondansetron (ZOFRAN) injection 4 mg (4 mg Intravenous Given 04/05/16 2252)     NEW OUTPATIENT MEDICATIONS STARTED DURING THIS VISIT:  New Prescriptions   No medications on file      Note:  This document was prepared using Dragon voice  recognition software and may include unintentional dictation errors.   Loleta Rose, MD 04/06/16 Moses Manners

## 2016-04-05 NOTE — ED Notes (Signed)
Pt in via triage with complaints of abdominal pain, nausea, shortness of breath x 2 days.  Pt reports hx of pancreatitis, stating this feels similar.  Pt A/Ox4, tachycardic, sats 87-89% on room air.  Pt placed on 2L nasal cannula to maintain sat greater than 90%.

## 2016-04-05 NOTE — ED Notes (Signed)
Pt presents with abdominal pain, nausea, fatigue. Pt states she has hx of pancreatitis and this feels like that. Pt alert & oriented with NAD noted.

## 2016-04-06 ENCOUNTER — Inpatient Hospital Stay: Payer: Self-pay

## 2016-04-06 ENCOUNTER — Encounter: Payer: Self-pay | Admitting: Internal Medicine

## 2016-04-06 DIAGNOSIS — E860 Dehydration: Secondary | ICD-10-CM | POA: Diagnosis present

## 2016-04-06 DIAGNOSIS — K859 Acute pancreatitis without necrosis or infection, unspecified: Secondary | ICD-10-CM | POA: Diagnosis present

## 2016-04-06 LAB — BASIC METABOLIC PANEL
ANION GAP: 7 (ref 5–15)
ANION GAP: 7 (ref 5–15)
Anion gap: 15 (ref 5–15)
Anion gap: 8 (ref 5–15)
BUN: 6 mg/dL (ref 6–20)
BUN: 6 mg/dL (ref 6–20)
CALCIUM: 6.9 mg/dL — AB (ref 8.9–10.3)
CALCIUM: 6.8 mg/dL — AB (ref 8.9–10.3)
CALCIUM: 6.8 mg/dL — AB (ref 8.9–10.3)
CHLORIDE: 108 mmol/L (ref 101–111)
CO2: 14 mmol/L — AB (ref 22–32)
CO2: 19 mmol/L — ABNORMAL LOW (ref 22–32)
CO2: 20 mmol/L — AB (ref 22–32)
CO2: 19 mmol/L — AB (ref 22–32)
CREATININE: 0.44 mg/dL (ref 0.44–1.00)
CREATININE: 0.38 mg/dL — AB (ref 0.44–1.00)
Calcium: 7.5 mg/dL — ABNORMAL LOW (ref 8.9–10.3)
Chloride: 101 mmol/L (ref 101–111)
Chloride: 102 mmol/L (ref 101–111)
Chloride: 102 mmol/L (ref 101–111)
Creatinine, Ser: 0.6 mg/dL (ref 0.44–1.00)
GFR calc Af Amer: >60 mL/min (ref >60)
GFR calc Af Amer: >60 mL/min (ref >60)
GFR calc Af Amer: >60 mL/min (ref >60)
GFR calc non Af Amer: >60 mL/min (ref >60)
GLUCOSE: 202 mg/dL — AB (ref 65–99)
GLUCOSE: 288 mg/dL — AB (ref 65–99)
GLUCOSE: 273 mg/dL — AB (ref 65–99)
Glucose, Bld: 301 mg/dL — ABNORMAL HIGH (ref 65–99)
POTASSIUM: 3.5 mmol/L (ref 3.5–5.1)
Potassium: 3.2 mmol/L — ABNORMAL LOW (ref 3.5–5.1)
Potassium: 3.1 mmol/L — ABNORMAL LOW (ref 3.5–5.1)
Potassium: 3.3 mmol/L — ABNORMAL LOW (ref 3.5–5.1)
SODIUM: 137 mmol/L (ref 135–145)
Sodium: 127 mmol/L — ABNORMAL LOW (ref 135–145)
Sodium: 129 mmol/L — ABNORMAL LOW (ref 135–145)
Sodium: 129 mmol/L — ABNORMAL LOW (ref 135–145)

## 2016-04-06 LAB — GLUCOSE, CAPILLARY
GLUCOSE-CAPILLARY: 254 mg/dL — AB (ref 65–99)
GLUCOSE-CAPILLARY: 279 mg/dL — AB (ref 65–99)
GLUCOSE-CAPILLARY: 251 mg/dL — AB (ref 65–99)
GLUCOSE-CAPILLARY: 221 mg/dL — AB (ref 65–99)
GLUCOSE-CAPILLARY: 227 mg/dL — AB (ref 65–99)
GLUCOSE-CAPILLARY: 180 mg/dL — AB (ref 65–99)
GLUCOSE-CAPILLARY: 188 mg/dL — AB (ref 65–99)
GLUCOSE-CAPILLARY: 257 mg/dL — AB (ref 65–99)
GLUCOSE-CAPILLARY: 259 mg/dL — AB (ref 65–99)
GLUCOSE-CAPILLARY: 267 mg/dL — AB (ref 65–99)
GLUCOSE-CAPILLARY: 269 mg/dL — AB (ref 65–99)
GLUCOSE-CAPILLARY: 248 mg/dL — AB (ref 65–99)
GLUCOSE-CAPILLARY: 260 mg/dL — AB (ref 65–99)
GLUCOSE-CAPILLARY: 271 mg/dL — AB (ref 65–99)
Glucose-Capillary: 318 mg/dL — ABNORMAL HIGH (ref 65–99)
Glucose-Capillary: 304 mg/dL — ABNORMAL HIGH (ref 65–99)
Glucose-Capillary: 278 mg/dL — ABNORMAL HIGH (ref 65–99)
Glucose-Capillary: 229 mg/dL — ABNORMAL HIGH (ref 65–99)
Glucose-Capillary: 172 mg/dL — ABNORMAL HIGH (ref 65–99)
Glucose-Capillary: 275 mg/dL — ABNORMAL HIGH (ref 65–99)
Glucose-Capillary: 256 mg/dL — ABNORMAL HIGH (ref 65–99)
Glucose-Capillary: 250 mg/dL — ABNORMAL HIGH (ref 65–99)
Glucose-Capillary: 229 mg/dL — ABNORMAL HIGH (ref 65–99)

## 2016-04-06 LAB — CBC
HCT: 34.6 % — ABNORMAL LOW (ref 35.0–47.0)
HCT: 34.5 % — ABNORMAL LOW (ref 35.0–47.0)
Hemoglobin: 12.3 g/dL (ref 12.0–16.0)
Hemoglobin: 12 g/dL (ref 12.0–16.0)
MCH: 29.4 pg (ref 26.0–34.0)
MCH: 30.1 pg (ref 26.0–34.0)
MCHC: 35.5 g/dL (ref 32.0–36.0)
MCHC: 36.6 g/dL — AB (ref 32.0–36.0)
MCV: 82.6 fL (ref 80.0–100.0)
MCV: 82.3 fL (ref 80.0–100.0)
PLATELETS: 569 10*3/uL — AB (ref 150–440)
Platelets: 409 10*3/uL (ref 150–440)
RBC: 4.17 MIL/uL (ref 3.80–5.20)
RBC: 3.98 MIL/uL (ref 3.80–5.20)
RDW: 14 % (ref 11.5–14.5)
RDW: 13.9 % (ref 11.5–14.5)
WBC: 20.6 10*3/uL — AB (ref 3.6–11.0)
WBC: 19.6 10*3/uL — ABNORMAL HIGH (ref 3.6–11.0)

## 2016-04-06 LAB — LIPID PANEL
CHOLESTEROL: 846 mg/dL — AB (ref 0–200)
LDL Cholesterol: UNDETERMINED mg/dL (ref 0–99)
VLDL: UNDETERMINED mg/dL (ref 0–40)

## 2016-04-06 LAB — TRIGLYCERIDES: Triglycerides: >5000 mg/dL — ABNORMAL HIGH (ref <150)

## 2016-04-06 LAB — LACTIC ACID, PLASMA
LACTIC ACID, VENOUS: 4.4 mmol/L — AB (ref 0.5–2.0)
Lactic Acid, Venous: 4 mmol/L (ref 0.5–2.0)

## 2016-04-06 LAB — LIPASE, BLOOD: Lipase: 150 U/L — ABNORMAL HIGH (ref 11–51)

## 2016-04-06 LAB — MRSA PCR SCREENING: MRSA BY PCR: NEGATIVE

## 2016-04-06 LAB — ALBUMIN: Albumin: 2.6 g/dL — ABNORMAL LOW (ref 3.5–5.0)

## 2016-04-06 LAB — MAGNESIUM: Magnesium: 1.8 mg/dL (ref 1.7–2.4)

## 2016-04-06 IMAGING — CT CT ANGIO CHEST
2 of 6 series · 18 of 46 positions shown · IV contrast (omnipaque)
Comparison: Chest radiograph 10/09/2014

CLINICAL DATA: Left upper quadrant abdominal pain and back pain.
Shortness of breath.

EXAM:
CT ANGIOGRAPHY CHEST WITH CONTRAST
TECHNIQUE: Multidetector CT imaging of the chest was performed using the
standard protocol during bolus administration of intravenous
contrast. Multiplanar CT image reconstructions and MIPs were
obtained to evaluate the vascular anatomy.
CONTRAST:  100 mL Omnipaque 350

[Series 6: pe thins 1.5 · axial · 0.68mm/px · z∈[-639,-414]mm · 15 of 211 slices shown]
[im 12/211  lung]
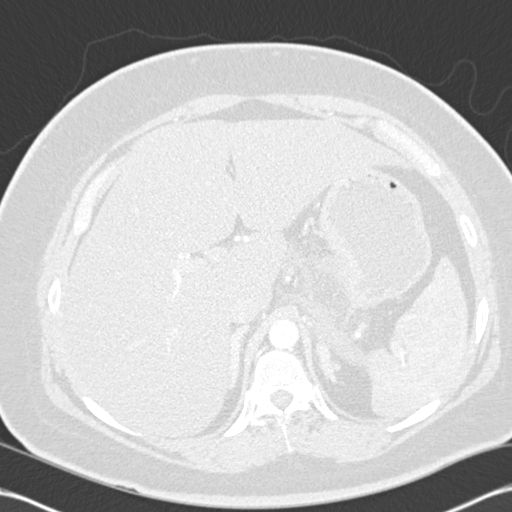
[im 23/211  soft-tissue]
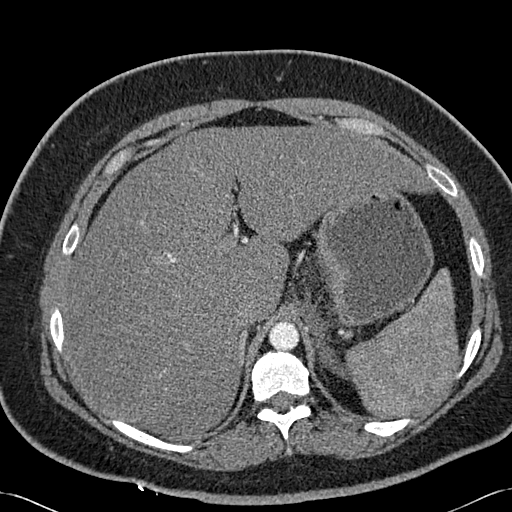
[im 45/211  lung]
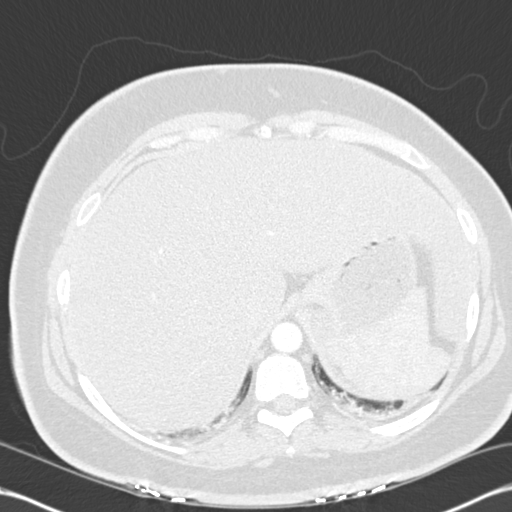
[im 56/211  soft-tissue]
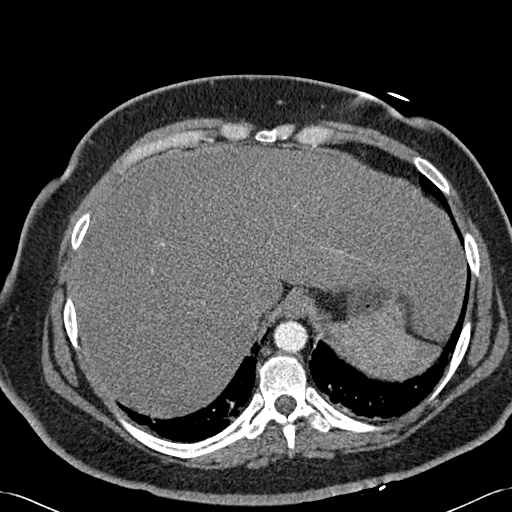
[im 67/211  lung]
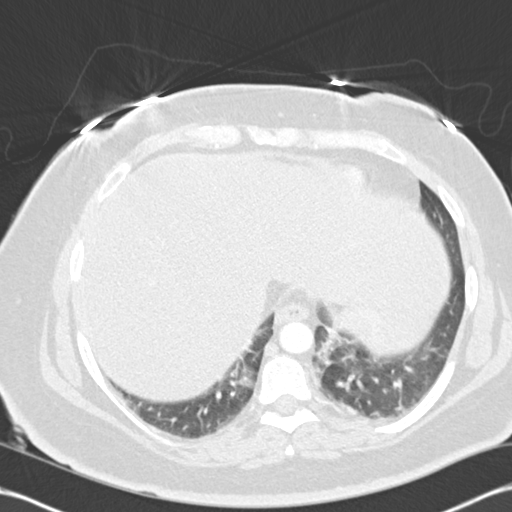
[im 78/211  soft-tissue]
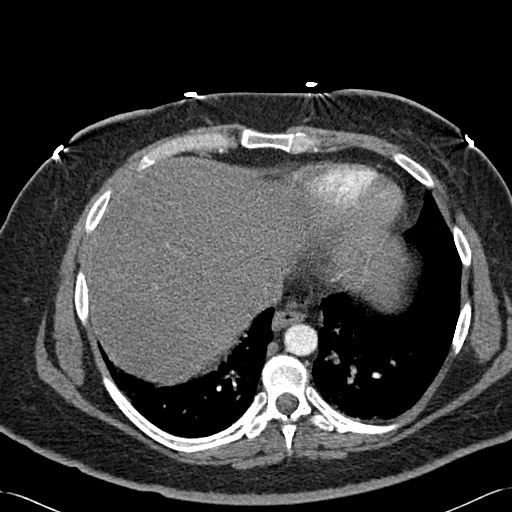
[im 89/211  lung]
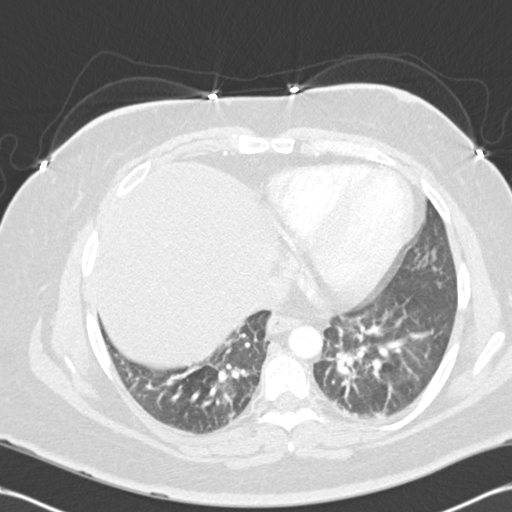
[im 111/211  soft-tissue]
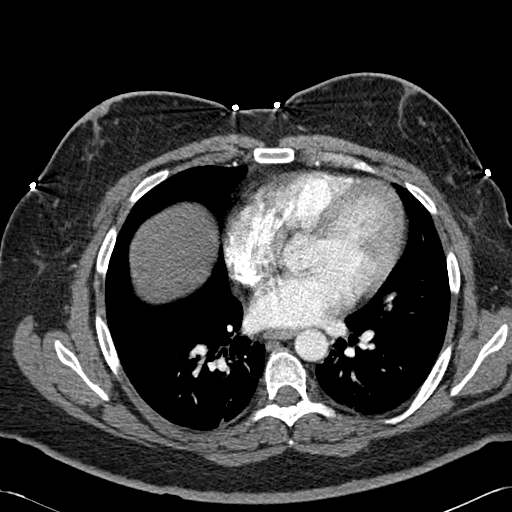
[im 122/211  lung]
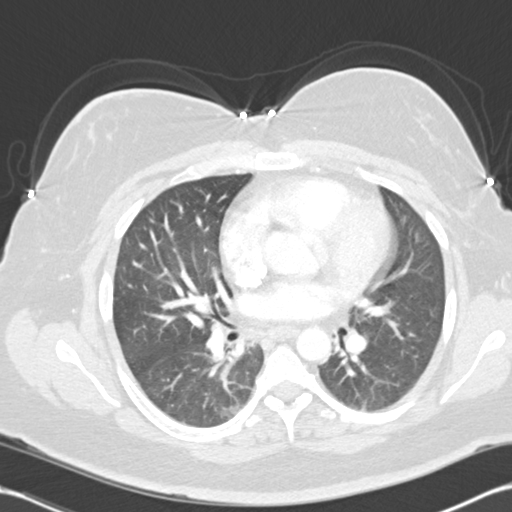
[im 133/211  soft-tissue]
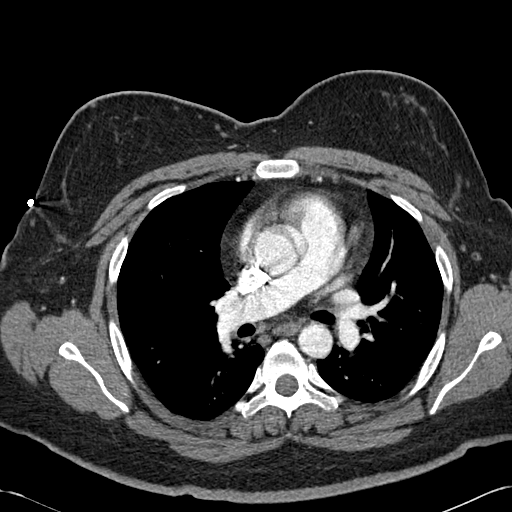
[im 144/211  lung]
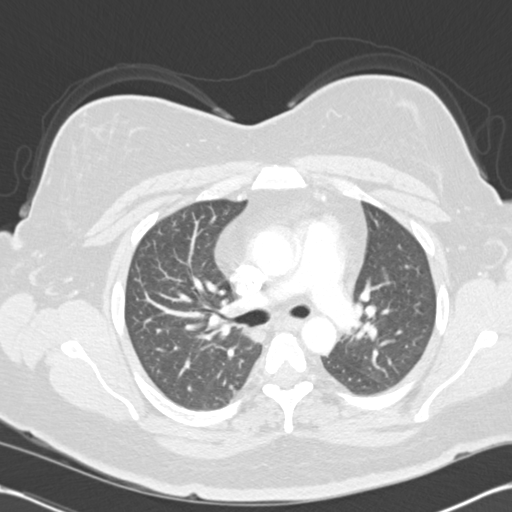
[im 155/211  soft-tissue]
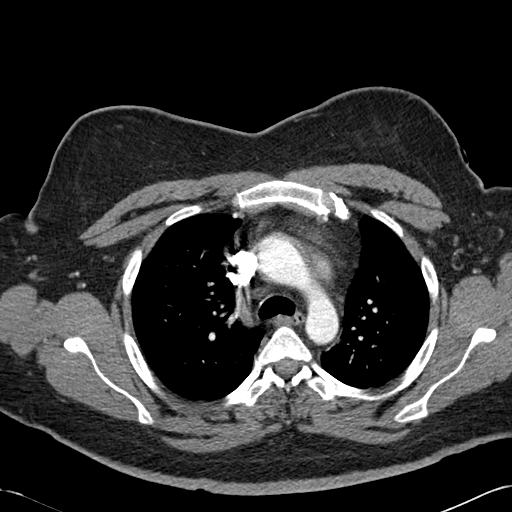
[im 177/211  lung]
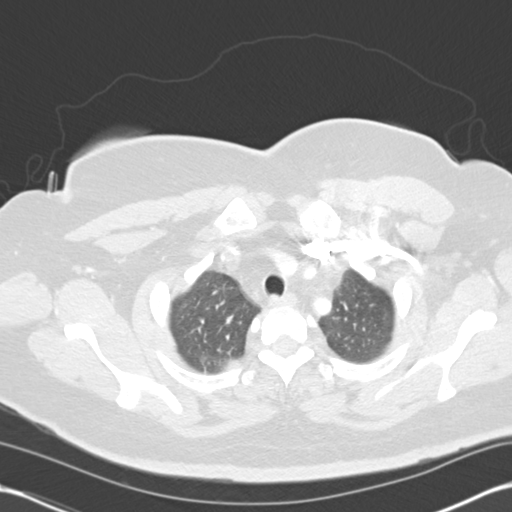
[im 188/211  soft-tissue]
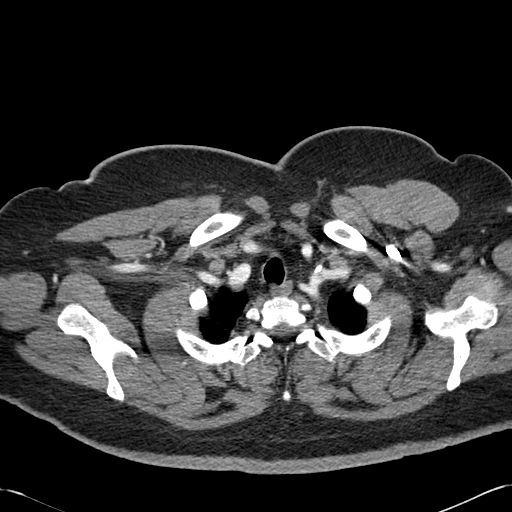
[im 199/211  lung]
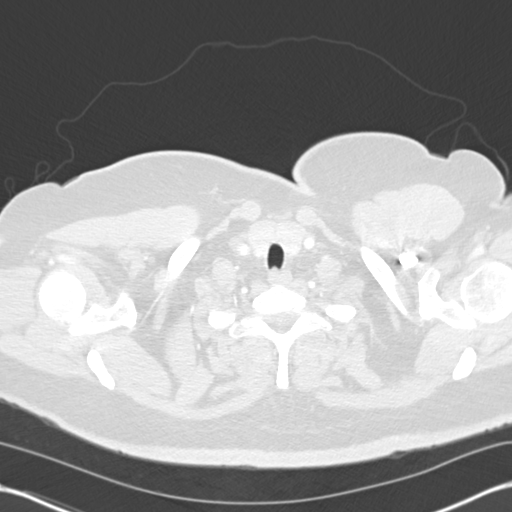

[Series 8: cor mpr 2.0 · coronal · 0.51mm/px · 3 of 151 slices shown]
[im 38/151  soft-tissue]
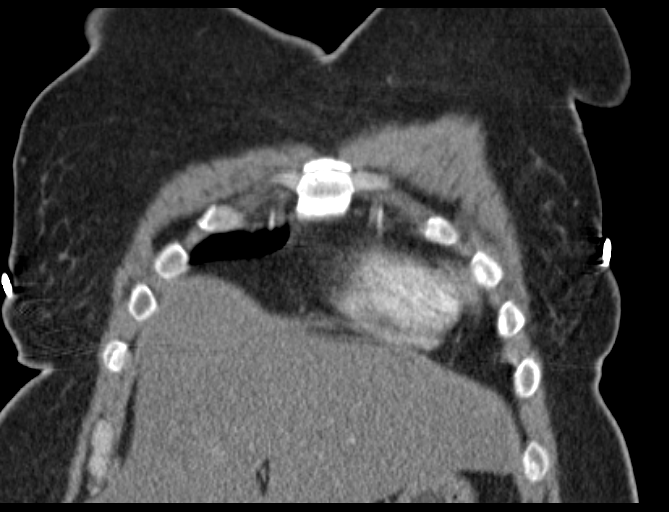
[im 76/151  soft-tissue]
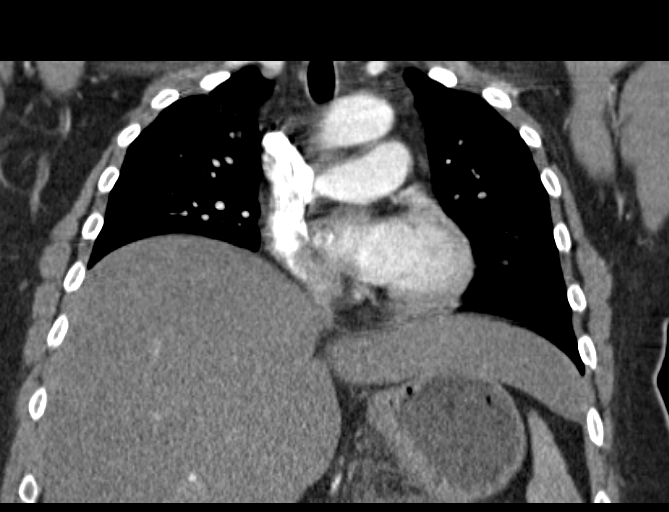
[im 113/151  soft-tissue]
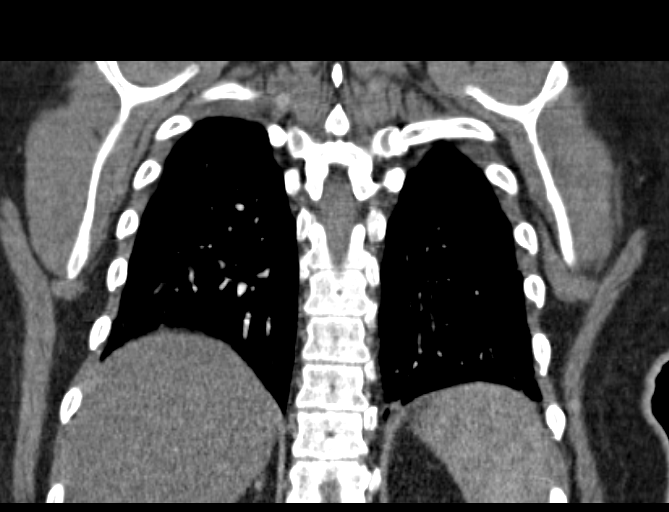

[18 of 46 positions shown; findings below may reference images not displayed]

FINDINGS: No evidence for pulmonary embolism. There is no significant
mediastinal, hilar or axillary lymphadenopathy. Normal appearance of
the thoracic aorta. No significant pericardial or pleural fluid.
Low-attenuation of the liver could represent hepatic steatosis.
There is edema and fluid in the left upper abdomen along the
posterior aspect of the stomach and medial to the spleen. This area
of inflammation is incompletely evaluated but the location is
suggestive for pancreatic inflammation.

The trachea and mainstem bronchi are patent. There are patchy
parenchymal densities in both lower lobes. Patchy parenchymal
densities probably represent areas of atelectasis. There is a small
focus of parenchymal disease in the medial right upper lobe on
sequence 7, image 28. No large areas of consolidation.

No acute bone abnormality.

Review of the MIP images confirms the above findings.
IMPRESSION: Negative for pulmonary embolism.

Inflammatory changes in the left upper abdomen. This area is
incompletely evaluated but the inflammation could be coming from the
pancreas. Consider dedicated imaging of the abdomen and pelvis.

Patchy lung densities most likely related to atelectasis.

## 2016-04-06 MED ORDER — INSULIN GLARGINE 100 UNIT/ML ~~LOC~~ SOLN
25.0000 [IU] | Freq: Every day | SUBCUTANEOUS | Status: DC
  Administered 2016-04-06: 25 [IU] via SUBCUTANEOUS
  Filled 2016-04-06 (×2): qty 0.25

## 2016-04-06 MED ORDER — SODIUM CHLORIDE 0.9 % IV SOLN: INTRAVENOUS | Status: DC

## 2016-04-06 MED ORDER — ACETAMINOPHEN 650 MG RE SUPP
650.0000 mg | Freq: Four times a day (QID) | RECTAL | Status: DC | PRN
Start: 2016-04-06 — End: 2016-04-11

## 2016-04-06 MED ORDER — KCL IN DEXTROSE-NACL 20-5-0.45 MEQ/L-%-% IV SOLN
INTRAVENOUS | Status: DC
  Filled 2016-04-06 (×3): qty 1000

## 2016-04-06 MED ORDER — SODIUM CHLORIDE 0.9% FLUSH
3.0000 mL | Freq: Two times a day (BID) | INTRAVENOUS | Status: DC
  Administered 2016-04-06 – 2016-04-11 (×9): 3 mL via INTRAVENOUS

## 2016-04-06 MED ORDER — SODIUM CHLORIDE 0.9 % IV BOLUS (SEPSIS)
1000.0000 mL | Freq: Once | INTRAVENOUS | Status: AC
  Administered 2016-04-06: 1000 mL via INTRAVENOUS

## 2016-04-06 MED ORDER — POTASSIUM CHLORIDE 10 MEQ/100ML IV SOLN
10.0000 meq | INTRAVENOUS | Status: AC
  Administered 2016-04-06 (×3): 10 meq via INTRAVENOUS
  Filled 2016-04-06 (×4): qty 100

## 2016-04-06 MED ORDER — GEMFIBROZIL 600 MG PO TABS
600.0000 mg | ORAL_TABLET | Freq: Two times a day (BID) | ORAL | Status: DC
  Administered 2016-04-06 – 2016-04-11 (×10): 600 mg via ORAL
  Filled 2016-04-06 (×10): qty 1

## 2016-04-06 MED ORDER — DEXTROSE-NACL 5-0.45 % IV SOLN
INTRAVENOUS | Status: DC
  Administered 2016-04-06 (×2): via INTRAVENOUS

## 2016-04-06 MED ORDER — DEXTROSE-NACL 5-0.45 % IV SOLN
INTRAVENOUS | Status: DC
  Administered 2016-04-06: 14:00:00 via INTRAVENOUS

## 2016-04-06 MED ORDER — ACETAMINOPHEN 325 MG PO TABS: 650.0000 mg | ORAL_TABLET | Freq: Four times a day (QID) | ORAL | Status: DC | PRN

## 2016-04-06 MED ORDER — MORPHINE SULFATE (PF) 4 MG/ML IV SOLN
4.0000 mg | Freq: Once | INTRAVENOUS | Status: AC
  Administered 2016-04-06: 4 mg via INTRAVENOUS
  Filled 2016-04-06: qty 1

## 2016-04-06 MED ORDER — ENOXAPARIN SODIUM 40 MG/0.4ML ~~LOC~~ SOLN
40.0000 mg | Freq: Every day | SUBCUTANEOUS | Status: DC
  Administered 2016-04-06 – 2016-04-10 (×6): 40 mg via SUBCUTANEOUS
  Filled 2016-04-06 (×6): qty 0.4

## 2016-04-06 MED ORDER — KCL IN DEXTROSE-NACL 20-5-0.45 MEQ/L-%-% IV SOLN
Freq: Once | INTRAVENOUS | Status: AC
  Administered 2016-04-06: 14:00:00 via INTRAVENOUS
  Filled 2016-04-06: qty 1000

## 2016-04-06 MED ORDER — MORPHINE SULFATE (PF) 4 MG/ML IV SOLN
4.0000 mg | INTRAVENOUS | Status: DC | PRN
  Administered 2016-04-06 – 2016-04-08 (×9): 4 mg via INTRAVENOUS
  Filled 2016-04-06 (×9): qty 1

## 2016-04-06 MED ORDER — SODIUM CHLORIDE 0.9 % IV SOLN
INTRAVENOUS | Status: DC
  Administered 2016-04-06: 2.4 [IU]/h via INTRAVENOUS
  Administered 2016-04-07: 05:00:00 via INTRAVENOUS
  Administered 2016-04-08: 5.4 [IU]/h via INTRAVENOUS
  Administered 2016-04-08: 4.2 [IU]/h via INTRAVENOUS
  Administered 2016-04-08: 6.9 [IU]/h via INTRAVENOUS
  Administered 2016-04-08: 4.4 [IU]/h via INTRAVENOUS
  Administered 2016-04-10: 5.1 [IU]/h via INTRAVENOUS
  Administered 2016-04-11: 2.7 [IU]/h via INTRAVENOUS
  Filled 2016-04-06 (×6): qty 2.5

## 2016-04-06 MED ORDER — DEXTROSE 50 % IV SOLN: 25.0000 mL | INTRAVENOUS | Status: DC | PRN

## 2016-04-06 MED ORDER — ONDANSETRON HCL 4 MG/2ML IJ SOLN
4.0000 mg | Freq: Four times a day (QID) | INTRAMUSCULAR | Status: DC | PRN
  Administered 2016-04-06 – 2016-04-08 (×5): 4 mg via INTRAVENOUS
  Filled 2016-04-06 (×6): qty 2

## 2016-04-06 MED ORDER — ONDANSETRON HCL 4 MG PO TABS
4.0000 mg | ORAL_TABLET | Freq: Four times a day (QID) | ORAL | Status: DC | PRN
  Administered 2016-04-06: 4 mg via ORAL
  Filled 2016-04-06 (×2): qty 1

## 2016-04-06 MED ORDER — SODIUM CHLORIDE 0.9 % IV SOLN
INTRAVENOUS | Status: DC
  Administered 2016-04-06 – 2016-04-11 (×12): via INTRAVENOUS

## 2016-04-06 NOTE — Progress Notes (Signed)
Recheck triglycerides,  ns bolus x2,  increase rate of fluids, Insulin drip at steady rate - 0.1 unit/kg/hr IVF to maintain glucose 150-200 When glucose <200 ok to switch to d5 1/2ns --  orders in, full note to follow

## 2016-04-06 NOTE — Progress Notes (Signed)
Dr.Hower is at bedside. Notified of sodium 127, and potassium 3.2.  Will continue to assess.

## 2016-04-06 NOTE — Plan of Care (Signed)
Problem: Safety: Goal: Ability to remain free from injury will improve Outcome: Completed/Met Date Met:  04/06/16 Pt has demonstrated appropriate safety awareness and is using call light appropriately.

## 2016-04-06 NOTE — Progress Notes (Signed)
Dr.Hower paged for clarification on orders regarding titration of  D5 with 20 of K.  Will continue to assess.

## 2016-04-06 NOTE — Progress Notes (Signed)
Pt is alert and oriented  X 4. Pt has been to the bedside commode today. Pt has had two complaints of pain on my shift, both controled with 4 mg morphine per order. 1 episode N& V. Pt remains on insulin drip, currently on NS at 150.  Report given to oncoming nurse.

## 2016-04-06 NOTE — Plan of Care (Signed)
Problem: Education: Goal: Knowledge of [facility] Education information/materials will improve Outcome: Completed/Met Date Met:  04/06/16 Pt has been provided welcome packet and had questions answered. Pt has been educated on medication being given.

## 2016-04-06 NOTE — Progress Notes (Signed)
[facility] [place] at [facility]   PATIENT NAME: [name] Bielak    MRN#:  [id number]  DATE OF BIRTH:  01/27/1981  SUBJECTIVE:  Hospital Day: 0 days [name] Enerson is a 35 y.o. female presenting with Abdominal Pain .   Overnight events: No acute overnight events Interval Events: Still complains of nausea without vomiting, some abdominal pain as well epigastric in location no worsening or relieving factors. States mouth is dry  REVIEW OF SYSTEMS:  CONSTITUTIONAL: No fever, fatigue or weakness.  EYES: No blurred or double vision.  EARS, NOSE, AND THROAT: No tinnitus or ear pain.  RESPIRATORY: No cough, shortness of breath, wheezing or hemoptysis.  CARDIOVASCULAR: No chest pain, orthopnea, edema.  GASTROINTESTINAL: Positive nausea, abdominal pain. Denies diarrhea GENITOURINARY: No dysuria, hematuria.  ENDOCRINE: No polyuria, nocturia,  HEMATOLOGY: No anemia, easy bruising or bleeding SKIN: No rash or lesion. MUSCULOSKELETAL: No joint pain or arthritis.   NEUROLOGIC: No tingling, numbness, weakness.  PSYCHIATRY: No anxiety or depression.   DRUG ALLERGIES:  No Known Allergies  VITALS:  Blood pressure 104/57, pulse 124, temperature 99.9 F (37.7 C), temperature source Oral, resp. rate 32, height 5\' 4"  (1.626 m), weight 97.2 kg (214 lb 4.6 oz), last menstrual period 03/18/2016, SpO2 90 %.  PHYSICAL EXAMINATION:  VITAL SIGNS: Filed Vitals:   04/06/16 0700 04/06/16 0800  BP: 111/72 104/57  Pulse: 124 124  Temp:  99.9 F (37.7 C)  Resp: 31 32   GENERAL:35 y.o.female currently in no acute distress.  HEAD: Normocephalic, atraumatic.  EYES: Pupils equal, round, reactive to light. Extraocular muscles intact. No scleral icterus.  MOUTH: Moist mucosal membrane. Dentition intact. No abscess noted.  EAR, NOSE, THROAT: Clear without exudates. No external lesions.  NECK: Supple. No thyromegaly. No nodules. No JVD.  PULMONARY: Clear to ascultation, without  wheeze rails or rhonci. No use of accessory muscles, Good respiratory effort. good air entry bilaterally CHEST: Nontender to palpation.  CARDIOVASCULAR: S1 and S2. Regular rate and rhythm. No murmurs, rubs, or gallops. No edema. Pedal pulses 2+ bilaterally.  GASTROINTESTINAL: Soft, minimal tenderness epigastric region without rebound or guarding, nondistended. No masses. Positive bowel sounds. No hepatosplenomegaly.  MUSCULOSKELETAL: No swelling, clubbing, or edema. Range of motion full in all extremities.  NEUROLOGIC: Cranial nerves II through XII are intact. No gross focal neurological deficits. Sensation intact. Reflexes intact.  SKIN: No ulceration, lesions, rashes, or cyanosis. Skin warm and dry. Turgor intact.  PSYCHIATRIC: Mood, affect within normal limits. The patient is awake, alert and oriented x 3. Insight, judgment intact.      LABORATORY PANEL:   CBC  Recent Labs Lab 04/06/16 0530  WBC 19.6*  HGB 12.0  HCT 34.5*  PLT 409   ------------------------------------------------------------------------------------------------------------------  Chemistries   Recent Labs Lab 04/05/16 1915  04/06/16 0946  NA 134*  < > 127*  K 4.4  < > 3.2*  CL 101  < > 101  CO2 15*  < > 19*  GLUCOSE 402*  < > 202*  BUN 8  < > 6  CREATININE 0.56  < > <0.30*  CALCIUM 8.9  < > 6.9*  AST 14*  --   --   ALT 12*  --   --   ALKPHOS 52  --   --   BILITOT 1.1  --   --   < > = values in this interval not displayed. ------------------------------------------------------------------------------------------------------------------  Cardiac Enzymes No results for input(s): TROPONINI in the last 168 hours. ------------------------------------------------------------------------------------------------------------------  RADIOLOGY:  US Abdomen Complete  04/06/2016  CLINICAL DATA:  35 year old female with abdominal pain. Elevated lipase. History of pancreatitis. EXAM: ABDOMEN ULTRASOUND  COMPLETE COMPARISON:  Abdominal ultrasound dated 02/16/2015 and CT dated 04/02/2011 FINDINGS: Gallbladder: No gallstones or wall thickening visualized. No sonographic Murphy sign noted by sonographer. Common bile duct: Diameter: 4 mm Liver: There is diffuse increased hepatic echogenicity compatible with fatty infiltration. IVC: No abnormality visualized. Pancreas: The pancreas appears enlarged with increased echogenicity. Findings may represent pancreatitis. Correlation with pancreatic enzymes recommended. The tail of the pancreas is not visualized. Spleen: Size and appearance within normal limits. Right Kidney: Length: 15 cm. Echogenicity within normal limits. No mass or hydronephrosis visualized. Left Kidney: Length: 14.5 cm. Echogenicity within normal limits. No mass or hydronephrosis visualized. Abdominal aorta: Not well visualized Other findings: None. IMPRESSION: Enlarged and echogenic pancreas. Findings may represent pancreatitis. Correlation with pancreatic enzymes recommended. Fatty liver. Electronically Signed   By: Elgie Collard M.D.   On: 04/06/2016 01:30   Dg Chest Portable 1 View  04/06/2016  CLINICAL DATA:  Abdominal pain and nausea. EXAM: PORTABLE CHEST 1 VIEW COMPARISON:  10/09/2014 FINDINGS: A single AP portable view of the chest demonstrates no focal airspace consolidation or alveolar edema. The lungs are grossly clear. There is no large effusion or pneumothorax. Cardiac and mediastinal contours appear unremarkable. IMPRESSION: No active disease. Electronically Signed   By: Ellery Plunk M.D.   On: 04/06/2016 01:11    EKG:   Orders placed or performed in visit on 10/09/14  . EKG 12-Lead    ASSESSMENT AND PLAN:   Channel Papandrea is a 35 y.o. female presenting with Abdominal Pain . Admitted 04/05/2016 : Day #: 0 days 1. Hypertriglyceridemia induced pancreatitis: Continue insulin drip 0.1 unit per kilogram per hour, ordered to liter normal saline boluses morning has patient  appeared in adequately fluid resuscitated, continue with D5 half normal saline since blood sugar is under 200. Goal is to maintain blood blood sugar between 150 and 200. We will titrate D5 to maintain that goal with steady state insulin. Continue Accu-Chek every hour when blood glucose remains between 150 and 200 consistently for approximately 4 hours can transition to every 2 hours and at that time can transfer to the floor from intensive care unit. Continue checking daily triglycerides culture) less than 500 We'll consult endocrinology It seems the patient used to take gemfibrozil however has not taken for about a month maybe More  2. Hypokalemia: Replace potassium, 4-5 3. Type 2 diabetes insulin requiring currently on insulin drip will need to transition went coming off insulin drip 4. Venous thromboembolism prophylactic: Lovenox   All the records are reviewed and case discussed with Care Management/Social Workerr. Management plans discussed with the patient, family and they are in agreement.  CODE STATUS: full TOTAL TIME TAKING CARE OF THIS PATIENT: 33 minutes.   POSSIBLE D/C IN 2-3DAYS, DEPENDING ON CLINICAL CONDITION.   Hower,  Mardi Mainland.D on 04/06/2016 at 1:07 PM  Between 7am to 6pm - Pager - [phone]  After 6pm: House Pager: - [phone]  [facility]  Office  [phone]  CC: Primary care physician; No primary care provider on file.

## 2016-04-06 NOTE — H&P (Signed)
[facility] - [place] at [facility]   PATIENT NAME: [name]    MR#:  [id number]  DATE OF BIRTH:  01/18/1981  DATE OF ADMISSION:  04/05/2016  PRIMARY CARE PHYSICIAN: No primary care provider on file.   REQUESTING/REFERRING PHYSICIAN: York CeriseForbach, MD  CHIEF COMPLAINT:   Chief Complaint  Patient presents with  . Abdominal Pain    HISTORY OF PRESENT ILLNESS:  [name]  is a 35 y.o. female who presents with Several days of epigastric abdominal pain. It is associated with significant nausea and vomiting. 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 She states that her last triglyceride check with her PCP returned a value of 14,000. She is on a number of medications to treat her triglycerides, but they tend not to lower her to an appropriate level. Today her triglycerides read greater than 5000. Her lipase is 538. Hospitalists were called for admission and treatment of acute pancreatitis.  PAST MEDICAL HISTORY:   Past Medical History  Diagnosis Date  . Diabetes mellitus   . Hypertension   . Hypercholesteremia   . Pancreatitis     PAST SURGICAL HISTORY:   Past Surgical History  Procedure Laterality Date  . Knee surgery      SOCIAL HISTORY:   Social History  Substance Use Topics  . Smoking status: Former Games developermoker  . Smokeless tobacco: Not on file  . Alcohol Use: No    FAMILY HISTORY:   Family History  Problem Relation Age of Onset  . Cancer Other   . Diabetes Other   . Hypertension Other   . Cancer Paternal Grandfather     DRUG ALLERGIES:  No Known Allergies  MEDICATIONS AT HOME:   Prior to Admission medications   Medication Sig Start Date End Date Taking? Authorizing Provider  acetaminophen (TYLENOL) 500 MG tablet Take 1,000-1,500 mg by mouth every 6 (six) hours as needed. For pain.   Yes Historical Provider, MD  gemfibrozil (LOPID) 600 MG tablet Take 600 mg by mouth 2 (two) times daily before a  meal.   Yes Historical Provider, MD  insulin aspart (NOVOLOG) 100 UNIT/ML injection Inject 2 Units into the skin 2 (two) times daily before a meal. 2 units for blood sugar 151-200, 4 unit for 201-250,6 unit for 251-300,8 unit for 301-350, 10 units for 351-400   Yes Historical Provider, MD  insulin glargine (LANTUS) 100 UNIT/ML injection Inject 45 Units into the skin daily.    Yes Historical Provider, MD  Liraglutide (VICTOZA) 18 MG/3ML SOPN Inject 1.8 mg into the skin daily.   Yes Historical Provider, MD  lisinopril (PRINIVIL,ZESTRIL) 10 MG tablet Take 10 mg by mouth daily.   Yes Historical Provider, MD  pravastatin (PRAVACHOL) 10 MG tablet Take 40 mg by mouth daily.    Yes Historical Provider, MD  Saxagliptin-Metformin (KOMBIGLYZE XR) 2.03-999 MG TB24 Take 1 tablet by mouth daily.   Yes Historical Provider, MD    REVIEW OF SYSTEMS:  Review of Systems  Constitutional: Negative for fever, chills, weight loss and malaise/fatigue.  HENT: Negative for ear pain, hearing loss and tinnitus.   Eyes: Negative for blurred vision, double vision, pain and redness.  Respiratory: Negative for cough, hemoptysis and shortness of breath.   Cardiovascular: Negative for chest pain, palpitations, orthopnea and leg swelling.  Gastrointestinal: Positive for nausea, vomiting and abdominal pain. Negative for diarrhea and constipation.  Genitourinary: Negative for dysuria, frequency and hematuria.  Musculoskeletal: Negative for back pain, joint pain and neck  pain.  Skin:       No acne, rash, or lesions  Neurological: Negative for dizziness, tremors, focal weakness and weakness.  Endo/Heme/Allergies: Negative for polydipsia. Does not bruise/bleed easily.  Psychiatric/Behavioral: Negative for depression. The patient is not nervous/anxious and does not have insomnia.      VITAL SIGNS:   Filed Vitals:   04/05/16 2227 04/05/16 2229 04/05/16 2233 04/05/16 2353  BP: 133/87   117/66  Pulse:  122 121 120  Temp:     99.3 F (37.4 C)  TempSrc:    Oral  Resp:   28 26  Height:      Weight:      SpO2:  89% 93% 95%   Wt Readings from Last 3 Encounters:  04/05/16 104.327 kg (230 lb)  10/21/14 99.338 kg (219 lb)  05/04/09 112.946 kg (249 lb)    PHYSICAL EXAMINATION:  Physical Exam  Vitals reviewed. Constitutional: She is oriented to person, place, and time. She appears well-developed and well-nourished. No distress.  HENT:  Head: Normocephalic and atraumatic.  Dry mucous membranes  Eyes: Conjunctivae and EOM are normal. Pupils are equal, round, and reactive to light. No scleral icterus.  Neck: Normal range of motion. Neck supple. No JVD present. No thyromegaly present.  Cardiovascular: Regular rhythm and intact distal pulses.  Exam reveals no gallop and no friction rub.   No murmur heard. Tachycardic  Respiratory: Effort normal and breath sounds normal. No respiratory distress. She has no wheezes. She has no rales.  GI: Soft. Bowel sounds are normal. She exhibits no distension. There is tenderness (epigastric region).  Musculoskeletal: Normal range of motion. She exhibits no edema.  No arthritis, no gout  Lymphadenopathy:    She has no cervical adenopathy.  Neurological: She is alert and oriented to person, place, and time. No cranial nerve deficit.  No dysarthria, no aphasia  Skin: Skin is warm and dry. No rash noted. No erythema.  Psychiatric: She has a normal mood and affect. Her behavior is normal. Judgment and thought content normal.    LABORATORY PANEL:   CBC  Recent Labs Lab 04/05/16 1915  WBC 20.6*  HGB 19.8*  HCT 35.5  PLT 569*   ------------------------------------------------------------------------------------------------------------------  Chemistries   Recent Labs Lab 04/05/16 1915  NA 134*  K 4.4  CL 101  CO2 15*  GLUCOSE 402*  BUN 8  CREATININE 0.56  CALCIUM 8.9  AST 14*  ALT 12*  ALKPHOS 52  BILITOT 1.1    ------------------------------------------------------------------------------------------------------------------  Cardiac Enzymes No results for input(s): TROPONINI in the last 168 hours. ------------------------------------------------------------------------------------------------------------------  RADIOLOGY:  No results found.  EKG:   Orders placed or performed in visit on 10/09/14  . EKG 12-Lead    IMPRESSION AND PLAN:  Principal Problem:   Acute pancreatitis - nothing by mouth, IV pain control and IV antiemetics when necessary, aggressive IV fluids for hydration. Check chem lipase Active Problems:   Hypertriglyceridemia - insulin drip, admitted to stepdown, monitor serial labs.  Check a.m. lipids   Dehydration - aggressive IV fluids   Other specified diabetes mellitus without complications (HCC) - currently on insulin drip, when this was discontinued she will need sliding scale insulin with corresponding glucose checks initiated   Essential hypertension - stable, patient is nothing by mouth for now and her blood pressure is normotensive so we will hold any antihypertensives for now.  All the records are reviewed and case discussed with ED provider. Management plans discussed with the patient and/or family.  DVT PROPHYLAXIS: SubQ lovenox  GI PROPHYLAXIS: None  ADMISSION STATUS: Inpatient  CODE STATUS: Full Code Status History    This patient does not have a recorded code status. Please follow your organizational policy for patients in this situation.      TOTAL TIME TAKING CARE OF THIS PATIENT: 45 minutes.    Linden Tagliaferro FIELDING 04/06/2016, 12:16 AM  [facility]  Office  [phone]  CC: Primary care physician; No primary care provider on file.

## 2016-04-06 NOTE — Progress Notes (Addendum)
Dr.Hower called to clarify new orders put in.  Per Dr.Hower pt is not to be on Glucostabilizer. Pt is to be on single rate insulin as per the note to see if pt's blood sugar remains stable.   Insulin Rate calculated to be (0.1*97.2 kg) at a rate of 9.7/ hr. Drip adjusted.  Will continue to assess.

## 2016-04-06 NOTE — Care Management (Signed)
CM consult for medication needs. Met with patient at bedside. Patient lives at home with her parents. She has no income and has been declined for disability. PCP is [facility] in [place]. She has no medication coverage and is unable to purchase her medications. She lives in [place]. Spoke with Altha Harm at [facility] and they will accept her as a client as long as she is seeing an Psychologist, prison and probation services. Application given with instruction on completing it.

## 2016-04-06 NOTE — Consult Note (Addendum)
Endocrine Initial Consult Note Date of Consult: 04/06/2016  Consulting Service: [facility] Endocrinology  Service Requesting Consult: Dr. Lavetta Nielsen  SUBJECTIVE: Reason for Consultation: hypertriglyceridemia-induced pancreatitis  History of Present Illness: [name] is a 35 y.o. female with known type 2 diabetes and hypertriglyceridemia with multiple prior episodes of acute pancreatitis who was admitted with severe abdominal pain. She is being treated in the ICU for acute pancreatitis secondary to hypertriglyceridemia. Triglyceride levels were >5000 upon admission. She reports at least once yearly episodes of acute pancreatitis over the past 5 years. She does not have health insurance coverage and does not have a PCP. She ran out of her medications 2 months ago and could not afford to purchase refills. She normally takes gemfibrozil for her hypertriglyceridemia and Lantus plus Novolin R for her type 2 diabetes. She was diagnosed with both conditions about 12 years ago. She was paying for the Novolin R out of pocket and receiving the Lantus free from a clinic. Family history is notable for father with diabetes. She is not aware with any other family members with hypertriglyceridemia. She does not smoke, quit 15 years ago. She denies alcohol use. She has not met with a nutritionist in the past.  Currently, she feels fine and reports improvement in her abdominal pain. Denies nausea or vomiting. She is tolerating clear liquids. She is on IV insulin at 9+ units/hour.   Patient Active Problem List   Diagnosis Date Noted  . Acute pancreatitis 04/06/2016  . Dehydration 04/06/2016  . Other specified diabetes mellitus without complications ([place]) 45/36/4680  . History of pancreatitis 10/21/2014  . Essential hypertension 10/21/2014  . Hypertriglyceridemia 10/21/2014  . LACERATION OF FINGER 05/04/2009     Past Medical History  Diagnosis Date  . Diabetes mellitus   . Hypertension   .  Hypercholesteremia   . Pancreatitis    Past Surgical History  Procedure Laterality Date  . Knee surgery     Family History  Problem Relation Age of Onset  . Cancer Other   . Diabetes Other   . Hypertension Other   . Cancer Paternal Grandfather     Social History:  Social History  Substance Use Topics  . Smoking status: Former Research scientist (life sciences)  . Smokeless tobacco: Not on file  . Alcohol Use: No     No Known Allergies   Medications:  No current facility-administered medications on file prior to encounter.   Current Outpatient Prescriptions on File Prior to Encounter  Medication Sig Dispense Refill  . acetaminophen (TYLENOL) 500 MG tablet Take 1,000-1,500 mg by mouth every 6 (six) hours as needed. For pain.    Marland Kitchen gemfibrozil (LOPID) 600 MG tablet Take 600 mg by mouth 2 (two) times daily before a meal.    . insulin aspart (NOVOLOG) 100 UNIT/ML injection Inject 2 Units into the skin 2 (two) times daily before a meal. 2 units for blood sugar 151-200, 4 unit for 201-250,6 unit for 251-300,8 unit for 301-350, 10 units for 351-400    . insulin glargine (LANTUS) 100 UNIT/ML injection Inject 45 Units into the skin daily.     Marland Kitchen lisinopril (PRINIVIL,ZESTRIL) 10 MG tablet Take 10 mg by mouth daily.    . pravastatin (PRAVACHOL) 10 MG tablet Take 40 mg by mouth daily.      Review of Systems: As in HPI. 10 pt ROS was otherwise normal.  OBJECTIVE: Temp:  [99.3 F (37.4 C)-100.2 F (37.9 C)] 100.2 F (37.9 C) (05/10 1400) Pulse Rate:  [118-127] 127 (05/10  1400) Resp:  [20-33] 32 (05/10 1400) BP: (104-133)/(53-87) 130/65 mmHg (05/10 1400) SpO2:  [89 %-95 %] 91 % (05/10 1400) Weight:  [97.2 kg (214 lb 4.6 oz)-104.327 kg (230 lb)] 97.2 kg (214 lb 4.6 oz) (05/10 0150)  Temp (24hrs), Avg:99.7 F (37.6 C), Min:99.3 F (37.4 C), Max:100.2 F (37.9 C)  Weight: 97.2 kg (214 lb 4.6 oz)  Physical Exam: Gen: no acute distress, obese young woman lying in bed HEENT: [place]/AT, eyes anicteric, EOMI,  mucous membranes moist, no oropharyngeal lesions Neck: no thyroid enlargement or nodules noted, no cervical lymphadenopathy CAD: regular rate, regular rhythm. No murmur rubs or gallops PULM: clear to ausculation, no wheezes, rhonchi or rales. GI: soft, obese, non tender to palpation, +bowel sounds normoactive EXT: no clubbing, cyanosis or edema, no foot lesions or ulcerations   Skin: warm, moist, no rash Neuro: grossly non focal, normal DTRs, alert and oriented x 3   BMP Latest Ref Rng 04/06/2016 04/06/2016 04/06/2016  Glucose 65 - 99 mg/dL 288(H) 202(H) 301(H)  BUN 6 - 20 mg/dL 6 6 <5(L)  Creatinine 0.44 - 1.00 mg/dL 0.44 <0.30(L) 0.60  Sodium 135 - 145 mmol/L 129(L) 127(L) 137  Potassium 3.5 - 5.1 mmol/L 3.1(L) 3.2(L) 3.5  Chloride 101 - 111 mmol/L 102 101 108  CO2 22 - 32 mmol/L 20(L) 19(L) 14(L)  Calcium 8.9 - 10.3 mg/dL 6.8(L) 6.9(L) 7.5(L)   CBC Latest Ref Rng 04/06/2016 04/05/2016 02/20/2015  WBC 3.6 - 11.0 K/uL 19.6(H) 20.6(H) 10.0  Hemoglobin 12.0 - 16.0 g/dL 12.0 12.3 11.0(L)  Hematocrit 35.0 - 47.0 % 34.5(L) 34.6(L) 33.5(L)  Platelets 150 - 440 K/uL 409 569(H) 310   Component     Latest Ref Rng 04/05/2016 04/06/2016 04/06/2016          5:30 AM  9:46 AM  Triglycerides     <150 mg/dL >5000 (H) >5000 (H) >5000 (H)   Component     Latest Ref Rng 10/10/2014 10/21/2014  Hemoglobin A1C      10.4 (H) 10.0   Blood glucose values reviewed in glucose accordion view  ASSESSMENT:  Hypertriglyceridemia Acute pancreatitis Uncontrolled type 2 diabetes Electrolyte derangements  RECOMMENDATIONS:   Trend triglycerides daily. Continue IV insulin infusion until Triglycerides <500. Goal to maintain BG 150-200 mg/dL. Currently, blood sugars are persistently>200 despite her receiving nearly 10 units/hr of insulin.  Will add Lantus 25 units once daily to reduce the IV insulin requirement.  Continue hourly glucometer checks. Switch IVF from D5 1/2 NS to NS 150 mL/hr. She is drinking clears  now. Hypoglycemia protocol. Replete potassium aggressively.  Check albumin to correct serum calcium, which appears to be low. May need repletion. Start gemfibrozil 600 mg twice daily. She is tolerating po. Please check Hb A1c.  Nutrition consult. Appreciate CSW providing the patient with resources for assistance with filling her medications. Discussed above recommendations with patient's nurse. Will follow along. Thank you for this consult.  Atha Starks, MD [facility] Endocrinology

## 2016-04-07 LAB — BASIC METABOLIC PANEL
ANION GAP: 7 (ref 5–15)
BUN: <5 mg/dL — ABNORMAL LOW (ref 6–20)
CALCIUM: 7 mg/dL — AB (ref 8.9–10.3)
CHLORIDE: 106 mmol/L (ref 101–111)
CO2: 20 mmol/L — ABNORMAL LOW (ref 22–32)
CREATININE: 0.42 mg/dL — AB (ref 0.44–1.00)
GFR calc non Af Amer: >60 mL/min (ref >60)
Glucose, Bld: 159 mg/dL — ABNORMAL HIGH (ref 65–99)
Potassium: 3.3 mmol/L — ABNORMAL LOW (ref 3.5–5.1)
SODIUM: 133 mmol/L — AB (ref 135–145)

## 2016-04-07 LAB — TRIGLYCERIDES
Triglycerides: 3077 mg/dL — ABNORMAL HIGH (ref <150)
Triglycerides: 2721 mg/dL — ABNORMAL HIGH (ref <150)

## 2016-04-07 LAB — GLUCOSE, CAPILLARY
GLUCOSE-CAPILLARY: 220 mg/dL — AB (ref 65–99)
GLUCOSE-CAPILLARY: 197 mg/dL — AB (ref 65–99)
GLUCOSE-CAPILLARY: 179 mg/dL — AB (ref 65–99)
GLUCOSE-CAPILLARY: 164 mg/dL — AB (ref 65–99)
GLUCOSE-CAPILLARY: 160 mg/dL — AB (ref 65–99)
GLUCOSE-CAPILLARY: 141 mg/dL — AB (ref 65–99)
GLUCOSE-CAPILLARY: 155 mg/dL — AB (ref 65–99)
GLUCOSE-CAPILLARY: 140 mg/dL — AB (ref 65–99)
GLUCOSE-CAPILLARY: 148 mg/dL — AB (ref 65–99)
GLUCOSE-CAPILLARY: 158 mg/dL — AB (ref 65–99)
GLUCOSE-CAPILLARY: 188 mg/dL — AB (ref 65–99)
GLUCOSE-CAPILLARY: 133 mg/dL — AB (ref 65–99)
Glucose-Capillary: 144 mg/dL — ABNORMAL HIGH (ref 65–99)
Glucose-Capillary: 148 mg/dL — ABNORMAL HIGH (ref 65–99)
Glucose-Capillary: 153 mg/dL — ABNORMAL HIGH (ref 65–99)
Glucose-Capillary: 157 mg/dL — ABNORMAL HIGH (ref 65–99)
Glucose-Capillary: 158 mg/dL — ABNORMAL HIGH (ref 65–99)
Glucose-Capillary: 159 mg/dL — ABNORMAL HIGH (ref 65–99)
Glucose-Capillary: 161 mg/dL — ABNORMAL HIGH (ref 65–99)
Glucose-Capillary: 165 mg/dL — ABNORMAL HIGH (ref 65–99)
Glucose-Capillary: 168 mg/dL — ABNORMAL HIGH (ref 65–99)
Glucose-Capillary: 170 mg/dL — ABNORMAL HIGH (ref 65–99)
Glucose-Capillary: 247 mg/dL — ABNORMAL HIGH (ref 65–99)
Glucose-Capillary: 247 mg/dL — ABNORMAL HIGH (ref 65–99)

## 2016-04-07 LAB — HEMOGLOBIN A1C: Hgb A1c MFr Bld: 13.7 % — ABNORMAL HIGH (ref 4.0–6.0)

## 2016-04-07 MED ORDER — POTASSIUM CHLORIDE 20 MEQ PO PACK
40.0000 meq | PACK | Freq: Once | ORAL | Status: AC
  Administered 2016-04-07: 40 meq via ORAL
  Filled 2016-04-07: qty 2

## 2016-04-07 MED ORDER — CALCIUM CARBONATE 1250 (500 CA) MG PO TABS
1.0000 | ORAL_TABLET | Freq: Every day | ORAL | Status: DC
  Filled 2016-04-07: qty 1

## 2016-04-07 MED ORDER — CALCIUM CARBONATE 1250 (500 CA) MG PO TABS
1.0000 | ORAL_TABLET | Freq: Two times a day (BID) | ORAL | Status: DC
  Filled 2016-04-07 (×2): qty 1

## 2016-04-07 MED ORDER — INSULIN GLARGINE 100 UNIT/ML ~~LOC~~ SOLN
40.0000 [IU] | Freq: Every day | SUBCUTANEOUS | Status: DC
  Administered 2016-04-07: 40 [IU] via SUBCUTANEOUS
  Filled 2016-04-07 (×2): qty 0.4

## 2016-04-07 MED ORDER — CALCIUM CARBONATE ANTACID 500 MG PO CHEW
2.5000 | CHEWABLE_TABLET | Freq: Two times a day (BID) | ORAL | Status: DC
Start: 2016-04-07 — End: 2016-04-11
  Administered 2016-04-07 – 2016-04-11 (×8): 500 mg via ORAL
  Filled 2016-04-07 (×9): qty 1

## 2016-04-07 NOTE — Plan of Care (Signed)
Problem: Skin Integrity: Goal: Risk for impaired skin integrity will decrease Outcome: Completed/Met Date Met:  04/07/16 Pt risk for impaired skin integrity is low. Pt is able to turn her self, and has adequate intake.

## 2016-04-07 NOTE — Progress Notes (Signed)
Date of Consult: 04/06/2016  Consulting Service[facility]  Service Requesting Consult: Dr. Clint GuyHower  SUBJECTIVE: Reason for Consultation: hypertriglyceridemia-induced pancreatitis  History of Present Illness: Jacqulynn CadetBrandy Elza is a 35 y.o. female with known type 2 diabetes and hypertriglyceridemia with multiple prior episodes of acute pancreatitis who was admitted with severe abdominal pain. She is being treated in the ICU for acute pancreatitis secondary to hypertriglyceridemia. Triglyceride levels were >5000 upon admission.   24 hour events:  She continues on IV insulin infusion. Lantus subQ was added yesterday along with oral gemfibrozil. Triglycerides now 3007. Continues on clear liquid diet. Clinically, she is improving but still nauseated.  O:  Scheduled Meds: . [START ON 04/08/2016] calcium carbonate  1 tablet Oral Q breakfast  . enoxaparin (LOVENOX) injection  40 mg Subcutaneous QHS  . gemfibrozil  600 mg Oral BID AC  . insulin glargine  25 Units Subcutaneous Daily  . sodium chloride flush  3 mL Intravenous Q12H   Continuous Infusions: . sodium chloride 150 mL/hr at 04/07/16 1200  . insulin (NOVOLIN-R) infusion 7.3 Units/hr (04/07/16 1247)   PRN Meds:.acetaminophen **OR** acetaminophen, dextrose, morphine injection, ondansetron **OR** ondansetron (ZOFRAN) IV Filed Vitals:   04/07/16 1100 04/07/16 1200  BP: 120/74 118/68  Pulse: 115 113  Temp:    Resp: 21 23   Gen: no acute distress, obese young woman, sleeping in bed HEENT: [place]/AT  CAD: regular rhythm on telemetry, tachycardic PULM: breathing unlabored on room air GI: soft, obese  EXT: no edema Skin:  no rash  BMP Latest Ref Rng 04/06/2016 04/06/2016 04/06/2016  Glucose 65 - 99 mg/dL 811(B273(H) 147(W288(H) 295(A202(H)  BUN 6 - 20 mg/dL <2(Z<5(L) 6 6  Creatinine 3.080.44 - 1.00 mg/dL 6.57(Q0.38(L) 4.690.44 <6.29(B<0.30(L)  Sodium 135 - 145 mmol/L 129(L) 129(L) 127(L)  Potassium 3.5 - 5.1 mmol/L 3.3(L) 3.1(L) 3.2(L)  Chloride 101 - 111  mmol/L 102 102 101  CO2 22 - 32 mmol/L 19(L) 20(L) 19(L)  Calcium 8.9 - 10.3 mg/dL 2.8(U6.8(L) 6.8(L) 6.9(L)   Component     Latest Ref Rng 04/05/2016 04/06/2016 04/06/2016 04/07/2016          5:30 AM  9:46 AM   Triglycerides     <150 mg/dL >1324>5000 (H) >4010>5000 (H) >2725>5000 (H) 3077 (H)   Component     Latest Ref Rng 04/06/2016  Hemoglobin A1C     4.0 - 6.0 % 13.7 (H)  Albumin     3.5 - 5.0 g/dL 2.6 (L)  Magnesium     1.7 - 2.4 mg/dL 1.8  Corrected serum calcium 7.9 mg/dL Blood glucose values reviewed in glucose accordion view  ASSESSMENT:  Hypertriglyceridemia Acute pancreatitis Uncontrolled type 2 diabetes Electrolyte derangements   RECOMMENDATIONS:   Continue IV insulin infusion until Triglycerides <500. Goal to maintain BG 150-200 mg/dL.  Increase daily subq Lantus dose to 40 units. Continue hourly glucometer checks. Hypoglycemia protocol. Continue potassium and calcium repletion. IV NS 150 mL/hr. Continue gemfibrozil 600 mg twice daily.   Will follow along. Thank you for this consult.  Doylene CanningAbby Abisogun, MD [facility] Endocrinology

## 2016-04-07 NOTE — Progress Notes (Signed)
Pt is alert and oriented x 4 today. Pain controlled with 4 mg morphine on my shift. Pt able to get up to commode to void once on my shift. Pt not eating meals well. Nausea controlled with Zofran. Pt remains on insulin drip with NS running @ 150.  Report given on coming RN.

## 2016-04-07 NOTE — Progress Notes (Signed)
Nurse called lab and actual TG: >3000

## 2016-04-07 NOTE — Progress Notes (Signed)
Inpatient Diabetes Program Recommendations  AACE/ADA: New Consensus Statement on Inpatient Glycemic Control (2015)  Target Ranges:  Prepandial:   less than 140 mg/dL      Peak postprandial:   less than 180 mg/dL (1-2 hours)      Critically ill patients:  140 - 180 mg/dL   Review of Glycemic Control  Results for [name], [name] (MRN [id number]) as of 04/07/2016 08:24  Ref. Range 04/07/2016 03:30 04/07/2016 04:45 04/07/2016 05:46 04/07/2016 06:38 04/07/2016 07:44  Glucose-Capillary Latest Ref Range: 65-99 mg/dL 914197 (H) 782179 (H) 956168 (H) 164 (H) 161 (H)    Diabetes history: Type 2 Outpatient Diabetes medications:  insulin aspart (NOVOLOG) 100 UNIT/ML injection Inject 2 Units into the skin 2 (two) times daily before a meal. 2 units for blood sugar 151-200, 4 unit for 201-250,6 unit for 251-300,8 unit for 301-350, 10 units for 351-400    . insulin glargine (LANTUS) 100 UNIT/ML injection Inject 45 Units into the skin daily.          Current orders for Inpatient glycemic control: Lantus 25 units qday @ 1800, IV insulin via Glucostabilizer currently at 9.1units/hour  Inpatient Diabetes Program Recommendations:  Follow recommendation by Dr. Aliene AltesAbisogun from 04/06/16; Continue IV insulin infusion until Triglycerides <500. Goal to maintain BG 150-200 mg/dL. Add Lantus 25 units once daily to reduce the IV insulin requirement.  Continue hourly glucometer checks. Continue Normal saline 150 mL/hr. She is drinking clears now.   Susette RacerJulie Nykira Reddix, RN, BA, MHA, CDE Diabetes Coordinator Inpatient Diabetes Program  [phone] (Team Pager) [phone] [facility] Office) 04/07/2016 8:23 AM

## 2016-04-07 NOTE — Progress Notes (Signed)
Chaplain rounded the unit and provided a compassionate presence and support to the patient. Chaplain Shanen Norris [phone] 

## 2016-04-07 NOTE — Plan of Care (Signed)
Problem: Pain Managment: Goal: General experience of comfort will improve Outcome: Progressing Pt's pain is being controlled on 4 mg morphine PRN at this time.

## 2016-04-07 NOTE — Progress Notes (Signed)
MEDICATION RELATED CONSULT NOTE - INITIAL   Pharmacy Consult for electrolyte replacement Indication: low K  No Known Allergies  Patient Measurements: Height: 5\' 4"  (162.6 cm) Weight: 214 lb 4.6 oz (97.2 kg) IBW/kg (Calculated) : 54.7   Vital Signs: Temp: 99.9 F (37.7 C) (05/11 0800) Temp Source: Oral (05/11 0800) BP: 118/68 mmHg (05/11 1200) Pulse Rate: 113 (05/11 1200) Intake/Output from previous day: 05/10 0701 - 05/11 0700 In: 3129.4 [I.V.:3029.4; IV Piggyback:100] Out: -  Intake/Output from this shift: Total I/O In: 799.5 [I.V.:799.5] Out: -   Labs:  Recent Labs  04/05/16 1915 04/06/16 0530  04/06/16 1347 04/06/16 1756 04/07/16 0340  WBC 20.6* 19.6*  --   --   --   --   HGB 12.3 12.0  --   --   --   --   HCT 34.6* 34.5*  --   --   --   --   PLT 569* 409  --   --   --   --   CREATININE 0.56 0.60  < > 0.44 0.38* 0.42*  MG  --   --   --   --  1.8  --   ALBUMIN 3.6  --   --   --  2.6*  --   PROT 6.8  --   --   --   --   --   AST 14*  --   --   --   --   --   ALT 12*  --   --   --   --   --   ALKPHOS 52  --   --   --   --   --   BILITOT 1.1  --   --   --   --   --   < > = values in this interval not displayed. Estimated Creatinine Clearance: 110 mL/min (by C-G formula based on Cr of 0.42).   Assessment: 35 yo female on insulin drip for hypertriglyceridemia with hypokalemia.   Goal of Therapy:  K 3.5-5.0  Plan:  K 3.3 - pt on clear liquid diet will order KCl 40 mEq PO x1  BMET in AM to follow up K  Crist FatWang, Jaquelin Meaney L 04/07/2016,2:49 PM

## 2016-04-07 NOTE — Progress Notes (Signed)
[name] [place] [facility]   PATIENT NAME: [name] CadetBrandy Nguyen    MRN#:  [id number]  DATE OF BIRTH:  07/07/1981  SUBJECTIVE:  Hospital Day: 1 day [name] is a 35 y.o. female presenting with Abdominal Pain .   Overnight events: No acute overnight events Interval Events: Still complains of nausea without vomiting, Improving abdominal pain as well epigastric in location no worsening or relieving factors.   REVIEW OF SYSTEMS:  CONSTITUTIONAL: No fever, fatigue or weakness.  EYES: No blurred or double vision.  EARS, NOSE, AND THROAT: No tinnitus or ear pain.  RESPIRATORY: No cough, shortness of breath, wheezing or hemoptysis.  CARDIOVASCULAR: No chest pain, orthopnea, edema.  GASTROINTESTINAL: Positive nausea, abdominal pain. Denies diarrhea GENITOURINARY: No dysuria, hematuria.  ENDOCRINE: No polyuria, nocturia,  HEMATOLOGY: No anemia, easy bruising or bleeding SKIN: No rash or lesion. MUSCULOSKELETAL: No joint pain or arthritis.   NEUROLOGIC: No tingling, numbness, weakness.  PSYCHIATRY: No anxiety or depression.   DRUG ALLERGIES:  No Known Allergies  VITALS:  Blood pressure 126/72, pulse 115, temperature 99.9 F (37.7 C), temperature source Oral, resp. rate 20, height 5\' 4"  (1.626 m), weight 97.2 kg (214 lb 4.6 oz), last menstrual period 03/18/2016, SpO2 92 %.  PHYSICAL EXAMINATION:  VITAL SIGNS: Filed Vitals:   04/07/16 0900 04/07/16 1000  BP:  126/72  Pulse:  115  Temp:    Resp: 55 20   GENERAL:35 y.o.female currently in no acute distress.  HEAD: Normocephalic, atraumatic.  EYES: Pupils equal, round, reactive to light. Extraocular muscles intact. No scleral icterus.  MOUTH: Moist mucosal membrane. Dentition intact. No abscess noted.  EAR, NOSE, THROAT: Clear without exudates. No external lesions.  NECK: Supple. No thyromegaly. No nodules. No JVD.  PULMONARY: Clear to ascultation, without wheeze rails or rhonci. No use of accessory  muscles, Good respiratory effort. good air entry bilaterally CHEST: Nontender to palpation.  CARDIOVASCULAR: S1 and S2. Regular rate and rhythm. No murmurs, rubs, or gallops. No edema. Pedal pulses 2+ bilaterally.  GASTROINTESTINAL: Soft, minimal tenderness epigastric region without rebound or guarding, nondistended. No masses. Positive bowel sounds. No hepatosplenomegaly.  MUSCULOSKELETAL: No swelling, clubbing, or edema. Range of motion full in all extremities.  NEUROLOGIC: Cranial nerves II through XII are intact. No gross focal neurological deficits. Sensation intact. Reflexes intact.  SKIN: No ulceration, lesions, rashes, or cyanosis. Skin warm and dry. Turgor intact.  PSYCHIATRIC: Mood, affect within normal limits. The patient is awake, alert and oriented x 3. Insight, judgment intact.      LABORATORY PANEL:   CBC  Recent Labs Lab 04/06/16 0530  WBC 19.6*  HGB 12.0  HCT 34.5*  PLT 409   ------------------------------------------------------------------------------------------------------------------  Chemistries   Recent Labs Lab 04/05/16 1915  04/06/16 1756  NA 134*  < > 129*  K 4.4  < > 3.3*  CL 101  < > 102  CO2 15*  < > 19*  GLUCOSE 402*  < > 273*  BUN 8  < > <5*  CREATININE 0.56  < > 0.38*  CALCIUM 8.9  < > 6.8*  MG  --   --  1.8  AST 14*  --   --   ALT 12*  --   --   ALKPHOS 52  --   --   BILITOT 1.1  --   --   < > = values in this interval not displayed. ------------------------------------------------------------------------------------------------------------------  Cardiac Enzymes No results for input(s): TROPONINI in the last 168  hours. ------------------------------------------------------------------------------------------------------------------  RADIOLOGY:  US Abdomen Complete  04/06/2016  CLINICAL DATA:  35 year old female with abdominal pain. Elevated lipase. History of pancreatitis. EXAM: ABDOMEN ULTRASOUND COMPLETE COMPARISON:  Abdominal  ultrasound dated 02/16/2015 and CT dated 04/02/2011 FINDINGS: Gallbladder: No gallstones or wall thickening visualized. No sonographic Murphy sign noted by sonographer. Common bile duct: Diameter: 4 mm Liver: There is diffuse increased hepatic echogenicity compatible with fatty infiltration. IVC: No abnormality visualized. Pancreas: The pancreas appears enlarged with increased echogenicity. Findings may represent pancreatitis. Correlation with pancreatic enzymes recommended. The tail of the pancreas is not visualized. Spleen: Size and appearance within normal limits. Right Kidney: Length: 15 cm. Echogenicity within normal limits. No mass or hydronephrosis visualized. Left Kidney: Length: 14.5 cm. Echogenicity within normal limits. No mass or hydronephrosis visualized. Abdominal aorta: Not well visualized Other findings: None. IMPRESSION: Enlarged and echogenic pancreas. Findings may represent pancreatitis. Correlation with pancreatic enzymes recommended. Fatty liver. Electronically Signed   By: Elgie Collard M.D.   On: 04/06/2016 01:30   Dg Chest Portable 1 View  04/06/2016  CLINICAL DATA:  Abdominal pain and nausea. EXAM: PORTABLE CHEST 1 VIEW COMPARISON:  10/09/2014 FINDINGS: A single AP portable view of the chest demonstrates no focal airspace consolidation or alveolar edema. The lungs are grossly clear. There is no large effusion or pneumothorax. Cardiac and mediastinal contours appear unremarkable. IMPRESSION: No active disease. Electronically Signed   By: Ellery Plunk M.D.   On: 04/06/2016 01:11    EKG:   Orders placed or performed in visit on 10/09/14  . EKG 12-Lead    ASSESSMENT AND PLAN:   [name] is a 35 y.o. female presenting with Abdominal Pain . Admitted 04/05/2016 : Day #: 1 day 1. Hypertriglyceridemia induced pancreatitis:  Endocrinology consult appreciated Continue insulin drip 0.1 unit per kilogram per hour, Goal is to maintain blood blood sugar between 150 and 200.   Continue checking daily triglycerides culture) less than 500-today's triglyceride level seems wrong, recheck Gemfibrozil restarted  2. Hypokalemia: Replace potassium, 4-5 3. Type 2 diabetes insulin requiring currently on insulin drip will need to transition went coming off insulin drip 4. Venous thromboembolism prophylactic: Lovenox   All the records are reviewed and case discussed with Care Management/Social Workerr. Management plans discussed with the patient, family and they are in agreement.  CODE STATUS: full TOTAL TIME TAKING CARE OF THIS PATIENT: 33 minutes.   POSSIBLE D/C IN 2-3DAYS, DEPENDING ON CLINICAL CONDITION.   Hower,  Mardi Mainland.D on 04/07/2016 at 11:39 AM  Between 7am to 6pm - Pager - [phone]  After 6pm: House Pager: - [phone]  [facility]  Office  [phone]  CC: Primary care physician; No primary care provider on file.

## 2016-04-08 LAB — GLUCOSE, CAPILLARY
GLUCOSE-CAPILLARY: 140 mg/dL — AB (ref 65–99)
GLUCOSE-CAPILLARY: 132 mg/dL — AB (ref 65–99)
GLUCOSE-CAPILLARY: 151 mg/dL — AB (ref 65–99)
GLUCOSE-CAPILLARY: 144 mg/dL — AB (ref 65–99)
GLUCOSE-CAPILLARY: 148 mg/dL — AB (ref 65–99)
GLUCOSE-CAPILLARY: 178 mg/dL — AB (ref 65–99)
GLUCOSE-CAPILLARY: 183 mg/dL — AB (ref 65–99)
GLUCOSE-CAPILLARY: 191 mg/dL — AB (ref 65–99)
GLUCOSE-CAPILLARY: 176 mg/dL — AB (ref 65–99)
GLUCOSE-CAPILLARY: 132 mg/dL — AB (ref 65–99)
Glucose-Capillary: 131 mg/dL — ABNORMAL HIGH (ref 65–99)
Glucose-Capillary: 135 mg/dL — ABNORMAL HIGH (ref 65–99)
Glucose-Capillary: 135 mg/dL — ABNORMAL HIGH (ref 65–99)
Glucose-Capillary: 137 mg/dL — ABNORMAL HIGH (ref 65–99)
Glucose-Capillary: 141 mg/dL — ABNORMAL HIGH (ref 65–99)
Glucose-Capillary: 151 mg/dL — ABNORMAL HIGH (ref 65–99)
Glucose-Capillary: 151 mg/dL — ABNORMAL HIGH (ref 65–99)
Glucose-Capillary: 156 mg/dL — ABNORMAL HIGH (ref 65–99)
Glucose-Capillary: 156 mg/dL — ABNORMAL HIGH (ref 65–99)
Glucose-Capillary: 162 mg/dL — ABNORMAL HIGH (ref 65–99)
Glucose-Capillary: 187 mg/dL — ABNORMAL HIGH (ref 65–99)
Glucose-Capillary: 208 mg/dL — ABNORMAL HIGH (ref 65–99)

## 2016-04-08 LAB — BASIC METABOLIC PANEL
ANION GAP: 5 (ref 5–15)
CALCIUM: 7.3 mg/dL — AB (ref 8.9–10.3)
CO2: 23 mmol/L (ref 22–32)
CREATININE: 0.45 mg/dL (ref 0.44–1.00)
Chloride: 108 mmol/L (ref 101–111)
GFR calc Af Amer: >60 mL/min (ref >60)
GFR calc non Af Amer: >60 mL/min (ref >60)
GLUCOSE: 146 mg/dL — AB (ref 65–99)
Potassium: 3.6 mmol/L (ref 3.5–5.1)
Sodium: 136 mmol/L (ref 135–145)

## 2016-04-08 LAB — TRIGLYCERIDES: Triglycerides: 1174 mg/dL — ABNORMAL HIGH (ref <150)

## 2016-04-08 MED ORDER — INSULIN GLARGINE 100 UNIT/ML ~~LOC~~ SOLN
50.0000 [IU] | Freq: Every day | SUBCUTANEOUS | Status: DC
  Administered 2016-04-08 – 2016-04-10 (×3): 50 [IU] via SUBCUTANEOUS
  Filled 2016-04-08 (×5): qty 0.5

## 2016-04-08 MED ORDER — OXYCODONE HCL 5 MG PO TABS
5.0000 mg | ORAL_TABLET | ORAL | Status: DC | PRN
  Administered 2016-04-08 – 2016-04-09 (×2): 5 mg via ORAL
  Filled 2016-04-08 (×2): qty 1

## 2016-04-08 NOTE — Progress Notes (Signed)
[facility] - [place] at [facility]   PATIENT NAME: [name]    MRN#:  [id number]  DATE OF BIRTH:  1980-12-03  SUBJECTIVE:  Hospital Day: 2 days [name] is a 35 y.o. female presenting with Abdominal Pain .   Overnight events: No acute overnight events Interval Events: States she is hungry, Improving abdominal pain as well epigastric in location no worsening or relieving factors.   REVIEW OF SYSTEMS:  CONSTITUTIONAL: No fever, fatigue or weakness.  EYES: No blurred or double vision.  EARS, NOSE, AND THROAT: No tinnitus or ear pain.  RESPIRATORY: No cough, shortness of breath, wheezing or hemoptysis.  CARDIOVASCULAR: No chest pain, orthopnea, edema.  GASTROINTESTINAL: Positive nausea, abdominal pain. Denies diarrhea GENITOURINARY: No dysuria, hematuria.  ENDOCRINE: No polyuria, nocturia,  HEMATOLOGY: No anemia, easy bruising or bleeding SKIN: No rash or lesion. MUSCULOSKELETAL: No joint pain or arthritis.   NEUROLOGIC: No tingling, numbness, weakness.  PSYCHIATRY: No anxiety or depression.   DRUG ALLERGIES:  No Known Allergies  VITALS:  Blood pressure 118/78, pulse 102, temperature 98.9 F (37.2 C), temperature source Oral, resp. rate 19, height  (1.626 m), weight 97.2 kg (214 lb 4.6 oz), last menstrual period 03/18/2016, SpO2 97 %.  PHYSICAL EXAMINATION:  VITAL SIGNS: Filed Vitals:   04/08/16 1000 04/08/16 1100  BP: 109/65 118/78  Pulse: 106 102  Temp:    Resp: 21 19   GENERAL:35 y.o.female currently in no acute distress.  HEAD: Normocephalic, atraumatic.  EYES: Pupils equal, round, reactive to light. Extraocular muscles intact. No scleral icterus.  MOUTH: Moist mucosal membrane. Dentition intact. No abscess noted.  EAR, NOSE, THROAT: Clear without exudates. No external lesions.  NECK: Supple. No thyromegaly. No nodules. No JVD.  PULMONARY: Clear to ascultation, without wheeze rails or rhonci. No use of accessory muscles, Good  respiratory effort. good air entry bilaterally CHEST: Nontender to palpation.  CARDIOVASCULAR: S1 and S2. Regular rate and rhythm. No murmurs, rubs, or gallops. No edema. Pedal pulses 2+ bilaterally.  GASTROINTESTINAL: Soft, minimal tenderness epigastric region without rebound or guarding, nondistended. No masses. Positive bowel sounds. No hepatosplenomegaly.  MUSCULOSKELETAL: No swelling, clubbing, or edema. Range of motion full in all extremities.  NEUROLOGIC: Cranial nerves II through XII are intact. No gross focal neurological deficits. Sensation intact. Reflexes intact.  SKIN: No ulceration, lesions, rashes, or cyanosis. Skin warm and dry. Turgor intact.  PSYCHIATRIC: Mood, affect within normal limits. The patient is awake, alert and oriented x 3. Insight, judgment intact.      LABORATORY PANEL:   CBC  Recent Labs Lab 04/06/16 0530  WBC 19.6*  HGB 12.0  HCT 34.5*  PLT 409   ------------------------------------------------------------------------------------------------------------------  Chemistries   Recent Labs Lab 04/05/16 1915  04/06/16 1756  04/08/16 0339  NA 134*  < > 129*  < > 136  K 4.4  < > 3.3*  < > 3.6  CL 101  < > 102  < > 108  CO2 15*  < > 19*  < > 23  GLUCOSE 402*  < > 273*  < > 146*  BUN 8  < > <5*  < > <5*  CREATININE 0.56  < > 0.38*  < > 0.45  CALCIUM 8.9  < > 6.8*  < > 7.3*  MG  --   --  1.8  --   --   AST 14*  --   --   --   --   ALT 12*  --   --   --   --  ALKPHOS 52  --   --   --   --   BILITOT 1.1  --   --   --   --   < > = values in this interval not displayed. ------------------------------------------------------------------------------------------------------------------  Cardiac Enzymes No results for input(s): TROPONINI in the last 168 hours. ------------------------------------------------------------------------------------------------------------------  RADIOLOGY:  No results found.  EKG:   Orders placed or performed in  visit on 10/09/14  . EKG 12-Lead    ASSESSMENT AND PLAN:   [name] is a 35 y.o. female presenting with Abdominal Pain . Admitted 04/05/2016 : Day #: 2 days 1. Hypertriglyceridemia induced pancreatitis:  Endocrinology consult appreciated Continue insulin drip 0.1 unit per kilogram per hour, Goal is to maintain blood blood sugar between 150 and 200.  Continue checking daily triglycerides culture) less than 500-today's triglyceride level seems wrong, recheck Gemfibrozil restarted  2. Hypokalemia: Replace potassium, 4-5 3. Type 2 diabetes insulin requiring currently on insulin drip will need to transition went coming off insulin drip 4. Venous thromboembolism prophylactic: Lovenox   All the records are reviewed and case discussed with Care Management/Social Workerr. Management plans discussed with the patient, family and they are in agreement.  CODE STATUS: full TOTAL TIME TAKING CARE OF THIS PATIENT: 33 minutes.   POSSIBLE D/C IN 2-3DAYS, DEPENDING ON CLINICAL CONDITION.   Hower,  Mardi MainlandDavid K M.D on 04/08/2016 at 1:05 PM  Between 7am to 6pm - Pager - [phone]  After 6pm: House Pager: - [phone]  [facility]  Office  [phone]  CC: Primary care physician; No primary care provider on file.

## 2016-04-08 NOTE — Progress Notes (Addendum)
MEDICATION RELATED CONSULT NOTE - INITIAL   Pharmacy Consult for electrolyte replacement Indication: low K  No Known Allergies  Patient Measurements: Height: 5\' 4"  (162.6 cm) Weight: 214 lb 4.6 oz (97.2 kg) IBW/kg (Calculated) : 54.7   Vital Signs: Temp: 98.9 F (37.2 C) (05/12 0700) Temp Source: Oral (05/12 0700) BP: 118/78 mmHg (05/12 1100) Pulse Rate: 102 (05/12 1100) Intake/Output from previous day: 05/11 0701 - 05/12 0700 In: 3896.7 [I.V.:3896.7] Out: -  Intake/Output from this shift: Total I/O In: 154.6 [I.V.:154.6] Out: -   Labs:  Recent Labs  04/05/16 1915 04/06/16 0530  04/06/16 1756 04/07/16 0340 04/08/16 0339  WBC 20.6* 19.6*  --   --   --   --   HGB 12.3 12.0  --   --   --   --   HCT 34.6* 34.5*  --   --   --   --   PLT 569* 409  --   --   --   --   CREATININE 0.56 0.60  < > 0.38* 0.42* 0.45  MG  --   --   --  1.8  --   --   ALBUMIN 3.6  --   --  2.6*  --   --   PROT 6.8  --   --   --   --   --   AST 14*  --   --   --   --   --   ALT 12*  --   --   --   --   --   ALKPHOS 52  --   --   --   --   --   BILITOT 1.1  --   --   --   --   --   < > = values in this interval not displayed. Estimated Creatinine Clearance: 110 mL/min (by C-G formula based on Cr of 0.45).   Assessment: 35 yo female on insulin drip for hypertriglyceridemia with hypokalemia.   Goal of Therapy:  K 3.5-5.0  Plan:  K 3.6 - patient remains on insulin drip. BMET in AM to follow up K.   Luisa HartChristy, Scott D 04/08/2016,3:30 PM   [phone] K 3.3. Ordered potassium chloride 40 mEq PO x 2 doses. Will recheck electrolytes with AM labs.  Deeann Servidio A. La Cygneookson, VermontPharm.D., BCPS Clinical Pharmacist 04/09/2106 [phone]

## 2016-04-08 NOTE — Progress Notes (Signed)
Consulting Service: [facility]  Service Requesting Consult: Dr. Clint Guy  SUBJECTIVE: Reason for Consultation: hypertriglyceridemia-induced pancreatitis  History of Present Illness: [name] is a 35 y.o. female with known type 2 diabetes and hypertriglyceridemia with multiple prior episodes of acute pancreatitis who was admitted with severe abdominal pain. She is being treated in the ICU for acute pancreatitis secondary to hypertriglyceridemia. Triglyceride levels were >5000 upon admission.   24 hour events:  She continues on IV insulin infusion. Diet has been advanced to regular diet. She denies N/V/abd pain. Tolerating her diet. Sugars improved and now in the 130-160 range since this morning on lantus 40 units daily + IV insulin at a rate from 2 units/hr - 4.4 units/hr. Triglycerides are improved at 1137.   Scheduled Meds: . calcium carbonate  2.5 tablet Oral BID WC  . enoxaparin (LOVENOX) injection  40 mg Subcutaneous QHS  . gemfibrozil  600 mg Oral BID AC  . insulin glargine  40 Units Subcutaneous Daily  . sodium chloride flush  3 mL Intravenous Q12H   Continuous Infusions: . sodium chloride 150 mL/hr at 04/08/16 1346  . insulin (NOVOLIN-R) infusion 5.4 Units/hr (04/08/16 1619)   PRN Meds:.acetaminophen **OR** acetaminophen, dextrose, morphine injection, ondansetron **OR** ondansetron (ZOFRAN) IV, oxyCODONE    Exam: BP 118/78 mmHg  Pulse 102  Temp(Src) 98.9 F (37.2 C) (Oral)  Resp 19  Ht  (1.626 m)  Wt 97.2 kg (214 lb 4.6 oz)  BMI 36.76 kg/m2  SpO2 97%  LMP 03/18/2016 (Exact Date)   Gen: no acute distress, obese young woman, sleeping in bed HEENT: [place], OP clear, EOMI NECK: Supple CAD: regular rhythm on telemetry, tachycardic PULM: Clear b/l, no wheeze GI: soft, obese  EXT: no peripheral edema SKIN:  no rash PSYC: Calm, cooperative, appropriate affect.  Labs:  Results for orders placed or performed during the hospital encounter of  04/05/16 (from the past 24 hour(s))  Glucose, capillary     Status: Abnormal   Collection Time: 04/07/16  4:35 PM  Result Value Ref Range   Glucose-Capillary 158 (H) 65 - 99 mg/dL  Glucose, capillary     Status: Abnormal   Collection Time: 04/07/16  5:20 PM  Result Value Ref Range   Glucose-Capillary 157 (H) 65 - 99 mg/dL  Glucose, capillary     Status: Abnormal   Collection Time: 04/07/16  6:33 PM  Result Value Ref Range   Glucose-Capillary 188 (H) 65 - 99 mg/dL  Glucose, capillary     Status: Abnormal   Collection Time: 04/07/16  7:30 PM  Result Value Ref Range   Glucose-Capillary 153 (H) 65 - 99 mg/dL  Glucose, capillary     Status: Abnormal   Collection Time: 04/07/16  8:31 PM  Result Value Ref Range   Glucose-Capillary 148 (H) 65 - 99 mg/dL  Glucose, capillary     Status: Abnormal   Collection Time: 04/07/16  9:26 PM  Result Value Ref Range   Glucose-Capillary 159 (H) 65 - 99 mg/dL  Glucose, capillary     Status: Abnormal   Collection Time: 04/07/16 10:38 PM  Result Value Ref Range   Glucose-Capillary 133 (H) 65 - 99 mg/dL  Glucose, capillary     Status: Abnormal   Collection Time: 04/07/16 11:39 PM  Result Value Ref Range   Glucose-Capillary 144 (H) 65 - 99 mg/dL  Glucose, capillary     Status: Abnormal   Collection Time: 04/08/16 12:41 AM  Result Value Ref Range   Glucose-Capillary  137 (H) 65 - 99 mg/dL  Glucose, capillary     Status: Abnormal   Collection Time: 04/08/16  1:40 AM  Result Value Ref Range   Glucose-Capillary 135 (H) 65 - 99 mg/dL  Glucose, capillary     Status: Abnormal   Collection Time: 04/08/16  2:40 AM  Result Value Ref Range   Glucose-Capillary 140 (H) 65 - 99 mg/dL  Triglycerides     Status: Abnormal   Collection Time: 04/08/16  3:39 AM  Result Value Ref Range   Triglycerides 1174 (H) <150 mg/dL  Basic metabolic panel     Status: Abnormal   Collection Time: 04/08/16  3:39 AM  Result Value Ref Range   Sodium 136 135 - 145 mmol/L    Potassium 3.6 3.5 - 5.1 mmol/L   Chloride 108 101 - 111 mmol/L   CO2 23 22 - 32 mmol/L   Glucose, Bld 146 (H) 65 - 99 mg/dL   BUN <5 (L) 6 - 20 mg/dL   Creatinine, Ser 1.61 0.44 - 1.00 mg/dL   Calcium 7.3 (L) 8.9 - 10.3 mg/dL   GFR calc non Af Amer >60 >60 mL/min   GFR calc Af Amer >60 >60 mL/min   Anion gap 5 5 - 15  Glucose, capillary     Status: Abnormal   Collection Time: 04/08/16  3:41 AM  Result Value Ref Range   Glucose-Capillary 156 (H) 65 - 99 mg/dL  Glucose, capillary     Status: Abnormal   Collection Time: 04/08/16  4:45 AM  Result Value Ref Range   Glucose-Capillary 151 (H) 65 - 99 mg/dL  Glucose, capillary     Status: Abnormal   Collection Time: 04/08/16  5:43 AM  Result Value Ref Range   Glucose-Capillary 132 (H) 65 - 99 mg/dL  Glucose, capillary     Status: Abnormal   Collection Time: 04/08/16  6:44 AM  Result Value Ref Range   Glucose-Capillary 135 (H) 65 - 99 mg/dL  Glucose, capillary     Status: Abnormal   Collection Time: 04/08/16  7:25 AM  Result Value Ref Range   Glucose-Capillary 141 (H) 65 - 99 mg/dL  Glucose, capillary     Status: Abnormal   Collection Time: 04/08/16  8:48 AM  Result Value Ref Range   Glucose-Capillary 151 (H) 65 - 99 mg/dL  Glucose, capillary     Status: Abnormal   Collection Time: 04/08/16  9:31 AM  Result Value Ref Range   Glucose-Capillary 162 (H) 65 - 99 mg/dL  Glucose, capillary     Status: Abnormal   Collection Time: 04/08/16 10:32 AM  Result Value Ref Range   Glucose-Capillary 156 (H) 65 - 99 mg/dL  Glucose, capillary     Status: Abnormal   Collection Time: 04/08/16 11:26 AM  Result Value Ref Range   Glucose-Capillary 144 (H) 65 - 99 mg/dL  Glucose, capillary     Status: Abnormal   Collection Time: 04/08/16 12:43 PM  Result Value Ref Range   Glucose-Capillary 148 (H) 65 - 99 mg/dL  Glucose, capillary     Status: Abnormal   Collection Time: 04/08/16  1:51 PM  Result Value Ref Range   Glucose-Capillary 187 (H) 65 - 99  mg/dL  Glucose, capillary     Status: Abnormal   Collection Time: 04/08/16  3:07 PM  Result Value Ref Range   Glucose-Capillary 208 (H) 65 - 99 mg/dL  Glucose, capillary     Status: Abnormal   Collection Time: 04/08/16  4:16 PM  Result Value Ref Range   Glucose-Capillary 178 (H) 65 - 99 mg/dL   Lab Results  Component Value Date   CHOL 846* 04/06/2016   HDL NOT REPORTED DUE TO HIGH TRIGLYCERIDES 04/06/2016   LDLCALC UNABLE TO CALCULATE  04/06/2016   TRIG 1174* 04/08/2016        04/06/16 -- Hgb A1c 13.7%     ASSESSMENT:  Hypertriglyceridemia Acute pancreatitis Type 2 diabetes, uncontrolled Obesity  RECOMMENDATIONS:  -Continue IV insulin infusion until Triglycerides <500. Goal to maintain BG 150-200 mg/dL.   -Increase Lantus dose to 50 units now.  -Continue hourly glucometer checks and IV insulin per non-DKA protocol.  -Once Triglycerides are <500, then calculate her basal insulin requirement by assessing her lowest IV insulin rate over last 24 hours and multiplying by 24 (eg if lowest requirement was 1.5 units/hr and 1.5 x 24 =  36 units) and ADD that to the 50 units of basal insulin. Then adjust Lantus to new basal rate, give the Lantus 6 PM and then stop IV insulin 2 hrs later.   -Will modify diet to avoid sweets.   -Continue potassium and calcium repletion.    -Continue gemfibrozil 600 mg twice daily.    Endocrinology is not available over the upcoming weekend. We will see patient in follow up on Monday 04/11/16 if she remains hospitalized.

## 2016-04-08 NOTE — Progress Notes (Signed)
Inpatient Diabetes Program Recommendations  AACE/ADA: New Consensus Statement on Inpatient Glycemic Control (2015)  Target Ranges:  Prepandial:   less than 140 mg/dL      Peak postprandial:   less than 180 mg/dL (1-2 hours)      Critically ill patients:  140 - 180 mg/dL   Review of Glycemic Control  Results for [name], [name] (MRN [id number]) as of 04/08/2016 09:24  Ref. Range 04/08/2016 04:45 04/08/2016 05:43 04/08/2016 06:44 04/08/2016 07:25 04/08/2016 08:48  Glucose-Capillary Latest Ref Range: 65-99 mg/dL 409151 (H) 811132 (H) 914135 (H) 141 (H) 151 (H)   Current orders for Inpatient glycemic control: Lantus 40 units qday @ 1800, IV insulin via Glucostabilizer currently at 2.4units/hour  Inpatient Diabetes Program Recommendations:  Follow recommendations by Dr. Aliene AltesAbisogun from 04/07/16;  Continue IV insulin infusion until Triglycerides <500. Goal to maintain BG 150-200 mg/dL.  Increase daily subq Lantus dose to 40 units. Continue hourly glucometer checks. Hypoglycemia protocol. Continue potassium and calcium repletion. IV NS 150 mL/hr. Continue gemfibrozil 600 mg twice daily.    Susette RacerJulie Dale Ribeiro, RN, BA, MHA, CDE Diabetes Coordinator Inpatient Diabetes Program  [phone] (Team Pager) [phone] [facility] Office) 04/08/2016 9:24 AM

## 2016-04-09 LAB — GLUCOSE, CAPILLARY
GLUCOSE-CAPILLARY: 116 mg/dL — AB (ref 65–99)
GLUCOSE-CAPILLARY: 125 mg/dL — AB (ref 65–99)
GLUCOSE-CAPILLARY: 137 mg/dL — AB (ref 65–99)
GLUCOSE-CAPILLARY: 149 mg/dL — AB (ref 65–99)
GLUCOSE-CAPILLARY: 161 mg/dL — AB (ref 65–99)
GLUCOSE-CAPILLARY: 192 mg/dL — AB (ref 65–99)
GLUCOSE-CAPILLARY: 227 mg/dL — AB (ref 65–99)
GLUCOSE-CAPILLARY: 232 mg/dL — AB (ref 65–99)
Glucose-Capillary: 110 mg/dL — ABNORMAL HIGH (ref 65–99)
Glucose-Capillary: 114 mg/dL — ABNORMAL HIGH (ref 65–99)
Glucose-Capillary: 118 mg/dL — ABNORMAL HIGH (ref 65–99)
Glucose-Capillary: 119 mg/dL — ABNORMAL HIGH (ref 65–99)
Glucose-Capillary: 132 mg/dL — ABNORMAL HIGH (ref 65–99)
Glucose-Capillary: 139 mg/dL — ABNORMAL HIGH (ref 65–99)
Glucose-Capillary: 144 mg/dL — ABNORMAL HIGH (ref 65–99)
Glucose-Capillary: 148 mg/dL — ABNORMAL HIGH (ref 65–99)
Glucose-Capillary: 149 mg/dL — ABNORMAL HIGH (ref 65–99)
Glucose-Capillary: 152 mg/dL — ABNORMAL HIGH (ref 65–99)
Glucose-Capillary: 176 mg/dL — ABNORMAL HIGH (ref 65–99)
Glucose-Capillary: 186 mg/dL — ABNORMAL HIGH (ref 65–99)
Glucose-Capillary: 199 mg/dL — ABNORMAL HIGH (ref 65–99)
Glucose-Capillary: 206 mg/dL — ABNORMAL HIGH (ref 65–99)
Glucose-Capillary: 237 mg/dL — ABNORMAL HIGH (ref 65–99)
Glucose-Capillary: 241 mg/dL — ABNORMAL HIGH (ref 65–99)

## 2016-04-09 LAB — TRIGLYCERIDES: Triglycerides: 987 mg/dL — ABNORMAL HIGH (ref <150)

## 2016-04-09 LAB — BASIC METABOLIC PANEL
Anion gap: 7 (ref 5–15)
BUN: <5 mg/dL — ABNORMAL LOW (ref 6–20)
CALCIUM: 7.8 mg/dL — AB (ref 8.9–10.3)
CHLORIDE: 109 mmol/L (ref 101–111)
CO2: 23 mmol/L (ref 22–32)
Creatinine, Ser: 0.31 mg/dL — ABNORMAL LOW (ref 0.44–1.00)
GFR calc Af Amer: >60 mL/min (ref >60)
GLUCOSE: 141 mg/dL — AB (ref 65–99)
Potassium: 3.3 mmol/L — ABNORMAL LOW (ref 3.5–5.1)
Sodium: 139 mmol/L (ref 135–145)

## 2016-04-09 MED ORDER — POTASSIUM CHLORIDE 20 MEQ PO PACK
40.0000 meq | PACK | Freq: Two times a day (BID) | ORAL | Status: AC
  Administered 2016-04-09 (×2): 40 meq via ORAL
  Filled 2016-04-09 (×2): qty 2

## 2016-04-09 MED ORDER — POTASSIUM CHLORIDE CRYS ER 20 MEQ PO TBCR
40.0000 meq | EXTENDED_RELEASE_TABLET | Freq: Two times a day (BID) | ORAL | Status: DC
  Filled 2016-04-09: qty 2

## 2016-04-09 NOTE — Progress Notes (Signed)
Rn notified Dr Juliene PinaMody that patient states unable to take Kdur as pill is too large causes patient to vomit last time, patient request to take the dissolving packet instead, MD states "that's okay, can you get pharmacy to change it?"

## 2016-04-09 NOTE — Progress Notes (Signed)
[facility] - [place] at [facility]   PATIENT NAME: [name]    MR#:  [id number]  DATE OF BIRTH:  01/07/1981  SUBJECTIVE:   Patient doing well this am  REVIEW OF SYSTEMS:    Review of Systems  Constitutional: Negative for fever, chills and malaise/fatigue.  HENT: Negative for ear discharge, ear pain, hearing loss, nosebleeds and sore throat.   Eyes: Negative for blurred vision and pain.  Respiratory: Negative for cough, hemoptysis, shortness of breath and wheezing.   Cardiovascular: Negative for chest pain, palpitations and leg swelling.  Gastrointestinal: Negative for nausea, vomiting, abdominal pain, diarrhea and blood in stool.  Genitourinary: Negative for dysuria.  Musculoskeletal: Negative for back pain.  Neurological: Negative for dizziness, tremors, speech change, focal weakness, seizures and headaches.  Endo/Heme/Allergies: Does not bruise/bleed easily.  Psychiatric/Behavioral: Negative for depression, suicidal ideas and hallucinations.    Tolerating Diet:yes      DRUG ALLERGIES:  No Known Allergies  VITALS:  Blood pressure 103/50, pulse 109, temperature 98.6 F (37 C), temperature source Oral, resp. rate 20, height 5\' 4"  (1.626 m), weight 97.2 kg (214 lb 4.6 oz), last menstrual period 03/18/2016, SpO2 92 %.  PHYSICAL EXAMINATION:   Physical Exam  Constitutional: [name] is oriented to person, place, and time and well-developed, well-nourished, and in no distress. No distress.  HENT:  Head: Normocephalic.  Eyes: No scleral icterus.  Neck: Normal range of motion. Neck supple. No JVD present. No tracheal deviation present.  Cardiovascular: Normal rate, regular rhythm and normal heart sounds.  Exam reveals no gallop and no friction rub.   No murmur heard. Pulmonary/Chest: Effort normal and breath sounds normal. No respiratory distress. [name] has no wheezes. [name] has no rales. [name] exhibits no tenderness.  Abdominal: Soft. Bowel sounds are normal.  [name] exhibits no distension and no mass. There is no tenderness. There is no rebound and no guarding.  Musculoskeletal: Normal range of motion. [name] exhibits no edema.  Neurological: [name] is alert and oriented to person, place, and time.  Skin: Skin is warm. No rash noted. No erythema.  Psychiatric: Affect and judgment normal.      LABORATORY PANEL:   CBC  Recent Labs Lab 04/06/16 0530  WBC 19.6*  HGB 12.0  HCT 34.5*  PLT 409   ------------------------------------------------------------------------------------------------------------------  Chemistries   Recent Labs Lab 04/05/16 1915  04/06/16 1756  04/09/16 0446  NA 134*  < > 129*  < > 139  K 4.4  < > 3.3*  < > 3.3*  CL 101  < > 102  < > 109  CO2 15*  < > 19*  < > 23  GLUCOSE 402*  < > 273*  < > 141*  BUN 8  < > <5*  < > <5*  CREATININE 0.56  < > 0.38*  < > 0.31*  CALCIUM 8.9  < > 6.8*  < > 7.8*  MG  --   --  1.8  --   --   AST 14*  --   --   --   --   ALT 12*  --   --   --   --   ALKPHOS 52  --   --   --   --   BILITOT 1.1  --   --   --   --   < > = values in this interval not displayed. ------------------------------------------------------------------------------------------------------------------  Cardiac Enzymes No results for input(s): TROPONINI in the last 168 hours. ------------------------------------------------------------------------------------------------------------------  RADIOLOGY:  No results found.   ASSESSMENT AND PLAN:    35 year old female with type 2 diabetes who presented with abdominal pain and found to have elevated triglycerides.  1. Acute pancreatitis due to elevated triglycerides. Continue treatment for hyper triglyceridemia with gemfibrozil and insulin drip. Continue IV insulin until triglycerides less than 500.  2. Diabetes: Continue Lantus 50 units and IV insulin. Once triglyceride levels are less than 500 then we'll use calculation as directed by Dr. Tedd Sias for  insulin. Appreciate Dr. Pricilla Handler consult.  3. Hypokalemia: Repleted and recheck in a.m.  Management plans discussed with the patient and [name] is in agreement.  CODE STATUS: full  TOTAL TIME TAKING CARE OF THIS PATIENT: 25 minutes.     POSSIBLE D/C 2-3 days, DEPENDING ON CLINICAL CONDITION.   Breton Berns M.D on 04/09/2016 at 11:42 AM  Between 7am to 6pm - Pager - [phone] After 6pm go to [email or web address] - Social research officer, government  [facility] [place] [facility]  Office  [phone]  CC: Primary care physician; No primary care provider on file.  Note: This dictation was prepared with Dragon dictation along with smaller phrase technology. Any transcriptional errors that result from this process are unintentional.

## 2016-04-10 LAB — BASIC METABOLIC PANEL
ANION GAP: 7 (ref 5–15)
BUN: <5 mg/dL — ABNORMAL LOW (ref 6–20)
CALCIUM: 8.3 mg/dL — AB (ref 8.9–10.3)
CHLORIDE: 107 mmol/L (ref 101–111)
CO2: 24 mmol/L (ref 22–32)
CREATININE: 0.35 mg/dL — AB (ref 0.44–1.00)
GFR calc non Af Amer: >60 mL/min (ref >60)
Glucose, Bld: 117 mg/dL — ABNORMAL HIGH (ref 65–99)
Potassium: 3.4 mmol/L — ABNORMAL LOW (ref 3.5–5.1)
SODIUM: 138 mmol/L (ref 135–145)

## 2016-04-10 LAB — GLUCOSE, CAPILLARY
GLUCOSE-CAPILLARY: 113 mg/dL — AB (ref 65–99)
GLUCOSE-CAPILLARY: 104 mg/dL — AB (ref 65–99)
GLUCOSE-CAPILLARY: 106 mg/dL — AB (ref 65–99)
GLUCOSE-CAPILLARY: 124 mg/dL — AB (ref 65–99)
GLUCOSE-CAPILLARY: 129 mg/dL — AB (ref 65–99)
GLUCOSE-CAPILLARY: 124 mg/dL — AB (ref 65–99)
GLUCOSE-CAPILLARY: 258 mg/dL — AB (ref 65–99)
GLUCOSE-CAPILLARY: 209 mg/dL — AB (ref 65–99)
GLUCOSE-CAPILLARY: 203 mg/dL — AB (ref 65–99)
Glucose-Capillary: 137 mg/dL — ABNORMAL HIGH (ref 65–99)
Glucose-Capillary: 114 mg/dL — ABNORMAL HIGH (ref 65–99)
Glucose-Capillary: 173 mg/dL — ABNORMAL HIGH (ref 65–99)
Glucose-Capillary: 217 mg/dL — ABNORMAL HIGH (ref 65–99)
Glucose-Capillary: 227 mg/dL — ABNORMAL HIGH (ref 65–99)
Glucose-Capillary: 172 mg/dL — ABNORMAL HIGH (ref 65–99)

## 2016-04-10 LAB — PHOSPHORUS: PHOSPHORUS: 2.9 mg/dL (ref 2.5–4.6)

## 2016-04-10 LAB — TRIGLYCERIDES: Triglycerides: 987 mg/dL — ABNORMAL HIGH (ref <150)

## 2016-04-10 LAB — MAGNESIUM: MAGNESIUM: 1.7 mg/dL (ref 1.7–2.4)

## 2016-04-10 MED ORDER — POTASSIUM CHLORIDE CRYS ER 20 MEQ PO TBCR: 40.0000 meq | EXTENDED_RELEASE_TABLET | Freq: Two times a day (BID) | ORAL | Status: DC

## 2016-04-10 MED ORDER — POTASSIUM CHLORIDE 20 MEQ PO PACK
40.0000 meq | PACK | Freq: Two times a day (BID) | ORAL | Status: DC
  Administered 2016-04-10 – 2016-04-11 (×3): 40 meq via ORAL
  Filled 2016-04-10 (×3): qty 2

## 2016-04-10 NOTE — Progress Notes (Signed)
Dr Juliene PinaMody at bedside, MD states to check patient blood sugar with toes and ear lobes to give patient's fingers a break until patient is okay with checking with fingers again.

## 2016-04-10 NOTE — Progress Notes (Signed)
MEDICATION RELATED CONSULT NOTE - INITIAL   Pharmacy Consult for electrolyte replacement Indication: low K  No Known Allergies  Patient Measurements: Height: 5\' 4"  (162.6 cm) Weight: 214 lb 4.6 oz (97.2 kg) IBW/kg (Calculated) : 54.7   Vital Signs: Temp: 96.3 F (35.7 C) (05/14 0200) Temp Source: Oral (05/14 0200) BP: 120/80 mmHg (05/14 0700) Pulse Rate: 91 (05/14 0700) Intake/Output from previous day: 05/13 0701 - 05/14 0700 In: 5573.5 [I.V.:5573.5] Out: -  Intake/Output from this shift:    Labs:  Recent Labs  04/08/16 0339 04/09/16 0446 04/10/16 0448  CREATININE 0.45 0.31* 0.35*  MG  --   --  1.7  PHOS  --   --  2.9   Estimated Creatinine Clearance: 110 mL/min (by C-G formula based on Cr of 0.35).   Assessment: 35 yo female on insulin drip for hypertriglyceridemia with hypokalemia.   Goal of Therapy:  K 3.5-5.0  Plan:  K 3.6 - patient remains on insulin drip. BMET in AM to follow up K.   Harrold Donathathan A Jillian Pianka 04/10/2016,7:06 AM   0513 0446 K 3.3. Ordered potassium chloride 40 mEq PO x 2 doses. Will recheck electrolytes with AM labs.  0514 0448 K 3.4, Mg 1.7, Phos 2.9. Ordered potassium chloride 40 mEq po BID x 2 doses. Will recheck electrolytes with AM labs.  Mikenzi Raysor A. Isabellaookson, VermontPharm.D., BCPS Clinical Pharmacist 04/09/2106 [phone]

## 2016-04-10 NOTE — Progress Notes (Signed)
RN notified Dr Juliene PinaMody of patient request to change diet from soft consistency to regular.  MD gave order for Carb Modified diet while keeping restrictions of carb and no concentrated sweet the same.  MD also told RN to start checking fsbs q2hr and if patient still unable to tolerate the frequency of stick to check q4hr.  Will start checking q2h now.

## 2016-04-10 NOTE — Progress Notes (Signed)
[facility] - [place] at [facility]   PATIENT NAME: [name]    MR#:  [id number]  DATE OF BIRTH:  02/18/1981  SUBJECTIVE:   C/o fingers being painful from all the BS checks No abdominal pain REVIEW OF SYSTEMS:    Review of Systems  Constitutional: Negative for fever, chills and malaise/fatigue.  HENT: Negative for ear discharge, ear pain, hearing loss, nosebleeds and sore throat.   Eyes: Negative for blurred vision and pain.  Respiratory: Negative for cough, hemoptysis, shortness of breath and wheezing.   Cardiovascular: Negative for chest pain, palpitations and leg swelling.  Gastrointestinal: Negative for nausea, vomiting, abdominal pain, diarrhea and blood in stool.  Genitourinary: Negative for dysuria.  Musculoskeletal: Negative for back pain.  Neurological: Negative for dizziness, tremors, speech change, focal weakness, seizures and headaches.  Endo/Heme/Allergies: Does not bruise/bleed easily.  Psychiatric/Behavioral: Negative for depression, suicidal ideas and hallucinations.    Tolerating Diet:yes      DRUG ALLERGIES:  No Known Allergies  VITALS:  Blood pressure 120/80, pulse 91, temperature 96.3 F (35.7 C), temperature source Oral, resp. rate 18, height 5\' 4"  (1.626 m), weight 97.2 kg (214 lb 4.6 oz), last menstrual period 03/18/2016, SpO2 92 %.  PHYSICAL EXAMINATION:   Physical Exam  Constitutional: She is oriented to person, place, and time and well-developed, well-nourished, and in no distress. No distress.  HENT:  Head: Normocephalic.  Eyes: No scleral icterus.  Neck: Normal range of motion. Neck supple. No JVD present. No tracheal deviation present.  Cardiovascular: Normal rate, regular rhythm and normal heart sounds.  Exam reveals no gallop and no friction rub.   No murmur heard. Pulmonary/Chest: Effort normal and breath sounds normal. No respiratory distress. She has no wheezes. She has no rales. She exhibits no tenderness.   Abdominal: Soft. Bowel sounds are normal. She exhibits no distension and no mass. There is no tenderness. There is no rebound and no guarding.  Musculoskeletal: Normal range of motion. She exhibits no edema.  Neurological: She is alert and oriented to person, place, and time.  Skin: Skin is warm. No rash noted. No erythema.  Psychiatric: Affect and judgment normal.      LABORATORY PANEL:   CBC  Recent Labs Lab 04/06/16 0530  WBC 19.6*  HGB 12.0  HCT 34.5*  PLT 409   ------------------------------------------------------------------------------------------------------------------  Chemistries   Recent Labs Lab 04/05/16 1915  04/10/16 0448  NA 134*  < > 138  K 4.4  < > 3.4*  CL 101  < > 107  CO2 15*  < > 24  GLUCOSE 402*  < > 117*  BUN 8  < > <5*  CREATININE 0.56  < > 0.35*  CALCIUM 8.9  < > 8.3*  MG  --   < > 1.7  AST 14*  --   --   ALT 12*  --   --   ALKPHOS 52  --   --   BILITOT 1.1  --   --   < > = values in this interval not displayed. ------------------------------------------------------------------------------------------------------------------  Cardiac Enzymes No results for input(s): TROPONINI in the last 168 hours. ------------------------------------------------------------------------------------------------------------------  RADIOLOGY:  No results found.   ASSESSMENT AND PLAN:    35 year old female with type 2 diabetes who presented with abdominal pain and found to have elevated triglycerides.  1. Acute pancreatitis due to elevated triglycerides. Continue treatment for hyper triglyceridemia with gemfibrozil and insulin drip. Continue IV insulin until triglycerides less than 500.  2. Diabetes:  Continue Lantus 50 units and IV insulin gtt. Once triglyceride levels are less than 500 then we'll use calculation as directed by Dr. Tedd Sias for insulin. Appreciate Dr. Pricilla Handler consult. Can change accuchecks q2 hours for patient comfort as her BS  have been adequately controlled on insulin gtt, if glucose stabilizer allows this.   3. Hypokalemia: Repleted and will recheck in AM   Management plans discussed with the patient and she is in agreement.  CODE STATUS: full  TOTAL TIME TAKING CARE OF THIS PATIENT: 25 minutes.     POSSIBLE D/C 2-3 days, DEPENDING ON CLINICAL CONDITION.   Caitlan Chauca M.D on 04/10/2016 at 8:39 AM  Between 7am to 6pm - Pager - [phone] After 6pm go to [email or web address] - Social research officer, government  [facility]  Office  [phone]  CC: Primary care physician; No primary care provider on file.  Note: This dictation was prepared with Dragon dictation along with smaller phrase technology. Any transcriptional errors that result from this process are unintentional.

## 2016-04-11 LAB — GLUCOSE, CAPILLARY
GLUCOSE-CAPILLARY: 125 mg/dL — AB (ref 65–99)
GLUCOSE-CAPILLARY: 126 mg/dL — AB (ref 65–99)
GLUCOSE-CAPILLARY: 150 mg/dL — AB (ref 65–99)
GLUCOSE-CAPILLARY: 235 mg/dL — AB (ref 65–99)
Glucose-Capillary: 132 mg/dL — ABNORMAL HIGH (ref 65–99)
Glucose-Capillary: 134 mg/dL — ABNORMAL HIGH (ref 65–99)
Glucose-Capillary: 198 mg/dL — ABNORMAL HIGH (ref 65–99)

## 2016-04-11 LAB — BASIC METABOLIC PANEL
ANION GAP: 8 (ref 5–15)
BUN: 6 mg/dL (ref 6–20)
CALCIUM: 8.6 mg/dL — AB (ref 8.9–10.3)
CO2: 25 mmol/L (ref 22–32)
CREATININE: 0.3 mg/dL — AB (ref 0.44–1.00)
Chloride: 104 mmol/L (ref 101–111)
GFR calc Af Amer: >60 mL/min (ref >60)
GFR calc non Af Amer: >60 mL/min (ref >60)
GLUCOSE: 133 mg/dL — AB (ref 65–99)
Potassium: 3.4 mmol/L — ABNORMAL LOW (ref 3.5–5.1)
Sodium: 137 mmol/L (ref 135–145)

## 2016-04-11 LAB — MAGNESIUM: Magnesium: 1.6 mg/dL — ABNORMAL LOW (ref 1.7–2.4)

## 2016-04-11 LAB — PHOSPHORUS: Phosphorus: 4.5 mg/dL (ref 2.5–4.6)

## 2016-04-11 LAB — TRIGLYCERIDES: TRIGLYCERIDES: 872 mg/dL — AB (ref <150)

## 2016-04-11 MED ORDER — INSULIN GLARGINE 100 UNIT/ML ~~LOC~~ SOLN: 40.0000 [IU] | Freq: Every day | SUBCUTANEOUS | Status: DC

## 2016-04-11 MED ORDER — MAGNESIUM SULFATE 2 GM/50ML IV SOLN
2.0000 g | Freq: Once | INTRAVENOUS | Status: AC
  Administered 2016-04-11: 2 g via INTRAVENOUS
  Filled 2016-04-11: qty 50

## 2016-04-11 MED ORDER — INSULIN GLARGINE 100 UNIT/ML ~~LOC~~ SOLN
40.0000 [IU] | Freq: Every day | SUBCUTANEOUS | Status: DC
  Filled 2016-04-11: qty 0.4

## 2016-04-11 MED ORDER — METFORMIN HCL 500 MG PO TABS: 500.0000 mg | ORAL_TABLET | Freq: Two times a day (BID) | ORAL | Status: DC

## 2016-04-11 MED ORDER — INSULIN REGULAR HUMAN 100 UNIT/ML IJ SOLN
8.0000 [IU] | Freq: Three times a day (TID) | INTRAMUSCULAR | Status: DC
  Filled 2016-04-11 (×3): qty 0.08

## 2016-04-11 MED ORDER — INSULIN REGULAR HUMAN 100 UNIT/ML IJ SOLN: 8.0000 [IU] | Freq: Three times a day (TID) | INTRAMUSCULAR | Status: DC

## 2016-04-11 NOTE — Progress Notes (Signed)
[facility] - [place] at [facility]   PATIENT NAME: [name]    MR#:  [id number]  DATE OF BIRTH:  03/03/1981  SUBJECTIVE:   Slept better last night since blood sugars are checked every 2 hours. She would like to go home if possible. REVIEW OF SYSTEMS:    Review of Systems  Constitutional: Negative for fever, chills and malaise/fatigue.  HENT: Negative for ear discharge, ear pain, hearing loss, nosebleeds and sore throat.   Eyes: Negative for blurred vision and pain.  Respiratory: Negative for cough, hemoptysis, shortness of breath and wheezing.   Cardiovascular: Negative for chest pain, palpitations and leg swelling.  Gastrointestinal: Negative for nausea, vomiting, abdominal pain, diarrhea and blood in stool.  Genitourinary: Negative for dysuria.  Musculoskeletal: Negative for back pain.  Neurological: Negative for dizziness, tremors, speech change, focal weakness, seizures and headaches.  Endo/Heme/Allergies: Does not bruise/bleed easily.  Psychiatric/Behavioral: Negative for depression, suicidal ideas and hallucinations.    Tolerating Diet:yes      DRUG ALLERGIES:  No Known Allergies  VITALS:  Blood pressure 111/79, pulse 79, temperature 98 F (36.7 C), temperature source Oral, resp. rate 18, height  (1.626 m), weight 97.2 kg (214 lb 4.6 oz), last menstrual period 03/18/2016, SpO2 97 %.  PHYSICAL EXAMINATION:   Physical Exam  Constitutional: She is oriented to person, place, and time and well-developed, well-nourished, and in no distress. No distress.  HENT:  Head: Normocephalic.  Eyes: No scleral icterus.  Neck: Normal range of motion. Neck supple. No JVD present. No tracheal deviation present.  Cardiovascular: Normal rate, regular rhythm and normal heart sounds.  Exam reveals no gallop and no friction rub.   No murmur heard. Pulmonary/Chest: Effort normal and breath sounds normal. No respiratory distress. She has no wheezes. She has no  rales. She exhibits no tenderness.  Abdominal: Soft. Bowel sounds are normal. She exhibits no distension and no mass. There is no tenderness. There is no rebound and no guarding.  Musculoskeletal: Normal range of motion. She exhibits no edema.  Neurological: She is alert and oriented to person, place, and time.  Skin: Skin is warm. No rash noted. No erythema.  Psychiatric: Affect and judgment normal.      LABORATORY PANEL:   CBC  Recent Labs Lab 04/06/16 0530  WBC 19.6*  HGB 12.0  HCT 34.5*  PLT 409   ------------------------------------------------------------------------------------------------------------------  Chemistries   Recent Labs Lab 04/05/16 1915  04/11/16 0618  NA 134*  < > 137  K 4.4  < > 3.4*  CL 101  < > 104  CO2 15*  < > 25  GLUCOSE 402*  < > 133*  BUN 8  < > 6  CREATININE 0.56  < > 0.30*  CALCIUM 8.9  < > 8.6*  MG  --   < > 1.6*  AST 14*  --   --   ALT 12*  --   --   ALKPHOS 52  --   --   BILITOT 1.1  --   --   < > = values in this interval not displayed. ------------------------------------------------------------------------------------------------------------------  Cardiac Enzymes No results for input(s): TROPONINI in the last 168 hours. ------------------------------------------------------------------------------------------------------------------  RADIOLOGY:  No results found.   ASSESSMENT AND PLAN:    35 year old female with type 2 diabetes who presented with abdominal pain and found to have elevated triglycerides.  1. Acute pancreatitis due to elevated triglycerides. Continue treatment for hyper triglyceridemia with gemfibrozil and insulin drip. Endocrinology to see  patient today and perhaps change insulin drip to subcutaneous insulin.  2. Diabetes: Continue Lantus 50 units and IV insulin gtt. Continue current plan with Accu-Cheks every 2 hours. Follow-up on endocrinology consult. Hopefully patient will be transitioned to  subcutaneous insulin and will be able to be discharged later today.   3. Hypokalemia: Repleted and will recheck in AM   Management plans discussed with the patient and she is in agreement.  CODE STATUS: full  TOTAL TIME TAKING CARE OF THIS PATIENT: 25 minutes.     POSSIBLE D/C 2-3 days, DEPENDING ON CLINICAL CONDITION.   Cyrilla Durkin M.D on 04/11/2016 at 11:12 AM  Between 7am to 6pm - Pager - [phone] After 6pm go to [email or web address] - Social research officer, governmentpassword [employer] [email or web address]  [facility]  Office  [phone]  CC: Primary care physician; No primary care provider on file.  Note: This dictation was prepared with Dragon dictation along with smaller phrase technology. Any transcriptional errors that result from this process are unintentional.

## 2016-04-11 NOTE — Discharge Summary (Signed)
[facility] - [place] at [facility]   PATIENT NAME: [name]    MR#:  [id number]  DATE OF BIRTH:  12/18/1980  DATE OF ADMISSION:  04/05/2016 ADMITTING PHYSICIAN: Oralia Manisavid Willis, MD  DATE OF DISCHARGE: 04/11/2016  PRIMARY CARE PHYSICIAN: No primary care provider on file.    ADMISSION DIAGNOSIS:  Dehydration [E86.0] Hyperlipidemia [E78.5] Other acute pancreatitis [K85.8]  DISCHARGE DIAGNOSIS:  Principal Problem:   Acute pancreatitis Active Problems:   Other specified diabetes mellitus without complications (HCC)   Essential hypertension   Hypertriglyceridemia   Dehydration   SECONDARY DIAGNOSIS:   Past Medical History  Diagnosis Date  . Diabetes mellitus   . Hypertension   . Hypercholesteremia   . Pancreatitis     HOSPITAL COURSE:   35 year old female with type 2 diabetes who presented with abdominal pain and found to have elevated triglycerides.  1. Acute pancreatitis due to elevated triglycerides. She was continued on gemfibrozil and initiated on insulin drip as per Recommendations by endocrinology. Her down pain improved and she was started on a regular/low-carb diet.   2. Diabetes:  she was initiated on Lantus 50 units and IV insulin gtt. Her blood sugars were monitored frequently. She was transitioned to an oral regimen prior to discharge. As per Endocrinology consult ,she will be discharged on Lantus 40 units [place], Metformin 500 mg BId with plans to titrate upwards and regular insulin 8 units before meals TID.  3. Hypokalemia: This was repleted daily.   DISCHARGE CONDITIONS AND DIET:   Stable diabetic low fat  CONSULTS OBTAINED:     DRUG ALLERGIES:  No Known Allergies  DISCHARGE MEDICATIONS:   Current Discharge Medication List    START taking these medications   Details  insulin regular (NOVOLIN R,HUMULIN R) 100 units/mL injection Inject 0.08 mLs (8 Units total) into the skin 3 (three) times daily before meals. Qty: 10 mL,  Refills: 11    metFORMIN (GLUCOPHAGE) 500 MG tablet Take 1 tablet (500 mg total) by mouth 2 (two) times daily with a meal. Qty: 60 tablet, Refills: 0      CONTINUE these medications which have CHANGED   Details  insulin glargine (LANTUS) 100 UNIT/ML injection Inject 0.4 mLs (40 Units total) into the skin daily. Qty: 10 mL, Refills: 11      CONTINUE these medications which have NOT CHANGED   Details  acetaminophen (TYLENOL) 500 MG tablet Take 1,000-1,500 mg by mouth every 6 (six) hours as needed. For pain.    gemfibrozil (LOPID) 600 MG tablet Take 600 mg by mouth 2 (two) times daily before a meal.    lisinopril (PRINIVIL,ZESTRIL) 10 MG tablet Take 10 mg by mouth daily.    pravastatin (PRAVACHOL) 10 MG tablet Take 40 mg by mouth daily.       STOP taking these medications     insulin aspart (NOVOLOG) 100 UNIT/ML injection      Liraglutide (VICTOZA) 18 MG/3ML SOPN      Saxagliptin-Metformin (KOMBIGLYZE XR) 2.03-999 MG TB24               Today   CHIEF COMPLAINT:  No issues no abdominal pain   VITAL SIGNS:  Blood pressure 120/90, pulse 85, temperature 98 F (36.7 C), temperature source Oral, resp. rate 18, height 5\' 4"  (1.626 m), weight 97.2 kg (214 lb 4.6 oz), last menstrual period 03/18/2016, SpO2 93 %.   REVIEW OF SYSTEMS:  Review of Systems  Constitutional: Negative for fever, chills and malaise/fatigue.  HENT: Negative  for ear discharge, ear pain, hearing loss, nosebleeds and sore throat.   Eyes: Negative for blurred vision and pain.  Respiratory: Negative for cough, hemoptysis, shortness of breath and wheezing.   Cardiovascular: Negative for chest pain, palpitations and leg swelling.  Gastrointestinal: Negative for nausea, vomiting, abdominal pain, diarrhea and blood in stool.  Genitourinary: Negative for dysuria.  Musculoskeletal: Negative for back pain.  Neurological: Negative for dizziness, tremors, speech change, focal weakness, seizures and  headaches.  Endo/Heme/Allergies: Does not bruise/bleed easily.  Psychiatric/Behavioral: Negative for depression, suicidal ideas and hallucinations.     PHYSICAL EXAMINATION:  GENERAL:  35 y.o.-year-old patient lying in the bed with no acute distress.  NECK:  Supple, no jugular venous distention. No thyroid enlargement, no tenderness.  LUNGS: Normal breath sounds bilaterally, no wheezing, rales,rhonchi  No use of accessory muscles of respiration.  CARDIOVASCULAR: S1, S2 normal. No murmurs, rubs, or gallops.  ABDOMEN: Soft, non-tender, non-distended. Bowel sounds present. No organomegaly or mass.  EXTREMITIES: No pedal edema, cyanosis, or clubbing.  PSYCHIATRIC: The patient is alert and oriented x 3.  SKIN: No obvious rash, lesion, or ulcer.   DATA REVIEW:   CBC  Recent Labs Lab 04/06/16 0530  WBC 19.6*  HGB 12.0  HCT 34.5*  PLT 409    Chemistries   Recent Labs Lab 04/05/16 1915  04/11/16 0618  NA 134*  < > 137  K 4.4  < > 3.4*  CL 101  < > 104  CO2 15*  < > 25  GLUCOSE 402*  < > 133*  BUN 8  < > 6  CREATININE 0.56  < > 0.30*  CALCIUM 8.9  < > 8.6*  MG  --   < > 1.6*  AST 14*  --   --   ALT 12*  --   --   ALKPHOS 52  --   --   BILITOT 1.1  --   --   < > = values in this interval not displayed.  Cardiac Enzymes No results for input(s): TROPONINI in the last 168 hours.  Microbiology Results  @  RADIOLOGY:  No results found.    Management plans discussed with the patient and she is in agreement. Stable for discharge home  Patient should follow up with Dr Tedd Sias in 1 week  CODE STATUS:     Code Status Orders        Start     Ordered   04/06/16 0150  Full code   Continuous     04/06/16 0149    Code Status History    Date Active Date Inactive Code Status Order ID Comments User Context   This patient has a current code status but no historical code status.      TOTAL TIME TAKING CARE OF THIS PATIENT: 36 minutes.    Note: This  dictation was prepared with Dragon dictation along with smaller phrase technology. Any transcriptional errors that result from this process are unintentional.  Morine Kohlman M.D on 04/11/2016 at 2:04 PM  Between 7am to 6pm - Pager - [phone] After 6pm go to [email or web address] - Social research officer, government  [facility]  Office  [phone]  CC: Primary care physician; No primary care provider on file.

## 2016-04-11 NOTE — Discharge Instructions (Signed)

## 2016-04-11 NOTE — Progress Notes (Signed)
[facility] [place] at [facility]        [name] SeenMcCall was admitted to the Hospital on 04/05/2016 and Discharged  04/11/2016 and should be excused from work/school   for 8  days starting 04/05/2016 , may return to work/school without any restrictions.  Call [name] SaranSital Aviraj Kentner MD with questions.  Talicia Sui M.D on 04/11/2016,at 2:08 PM  [facility] - [place] at [facility]    Office  [phone]

## 2016-04-11 NOTE — Progress Notes (Signed)
1420 Discharge teaching done . Discussed follow up and need for close follow up with Endocrinologist to prevent future admissions to the hospital. Print out given on Acute Pancreatitis. Also Dr. Everlene FarrierAbby's d/c notes per Dr. Camillia HerterMody's request were printed and also given to patient. Patient is awaiting a ride from mother.

## 2016-04-11 NOTE — Progress Notes (Signed)
Date of Consult: 04/06/2016  [facility]  Service Requesting Consult: Dr. Clint GuyHower  SUBJECTIVE: Reason for Consultation: hypertriglyceridemia-induced pancreatitis  History of Present Illness: Jacqulynn CadetBrandy Prude is a 35 y.o. female with known type 2 diabetes and hypertriglyceridemia with multiple prior episodes of acute pancreatitis who was admitted with severe abdominal pain. She is being treated in the ICU for acute pancreatitis secondary to hypertriglyceridemia. Triglyceride levels were >5000 upon admission.   24 hour events:  She feels well. She is eating a carb-modified diet without pain or nausea. Triglycerides are now down in the 800 range. She continues on IV insulin and once daily Lantus. She is hoping to be discharged home today.   O:  Scheduled Meds: . calcium carbonate  2.5 tablet Oral BID WC  . enoxaparin (LOVENOX) injection  40 mg Subcutaneous QHS  . gemfibrozil  600 mg Oral BID AC  . insulin glargine  50 Units Subcutaneous Daily  . potassium chloride  40 mEq Oral BID WC  . sodium chloride flush  3 mL Intravenous Q12H   Continuous Infusions: . sodium chloride 150 mL/hr at 04/11/16 0750  . insulin (NOVOLIN-R) infusion 0.5 mL/hr at 04/11/16 1100   PRN Meds:.acetaminophen **OR** acetaminophen, dextrose, morphine injection, ondansetron **OR** ondansetron (ZOFRAN) IV, oxyCODONE Filed Vitals:   04/11/16 1000 04/11/16 1100  BP:    Pulse: 92 94  Temp:    Resp: 25 17   Gen: no acute distress, obese young woman, sleeping in bed HEENT: [place]/AT  CAD: regular rhythm on telemetry  PULM: breathing unlabored on room air GI: soft, obese  EXT: no edema Skin:  no rash  BMP Latest Ref Rng 04/11/2016 04/10/2016 04/09/2016  Glucose 65 - 99 mg/dL 161(W133(H) 960(A117(H) 540(J141(H)  BUN 6 - 20 mg/dL 6 <8(J<5(L) <1(B<5(L)  Creatinine 0.44 - 1.00 mg/dL 1.47(W0.30(L) 2.95(A0.35(L) 2.13(Y0.31(L)  Sodium 135 - 145 mmol/L 137 138 139  Potassium 3.5 - 5.1 mmol/L 3.4(L) 3.4(L) 3.3(L)  Chloride 101 -  111 mmol/L 104 107 109  CO2 22 - 32 mmol/L 25 24 23   Calcium 8.9 - 10.3 mg/dL 8.6(V8.6(L) 8.3(L) 7.8(L)   Component     Latest Ref Rng 04/05/2016 04/06/2016 04/06/2016 04/07/2016          5:30 AM  9:46 AM   Triglycerides     <150 mg/dL >7846>5000 (H) >9629>5000 (H) >5284>5000 (H) 3077 (H)   Component     Latest Ref Rng 04/08/2016 04/09/2016 04/10/2016 04/11/2016             Triglycerides     <150 mg/dL 13241174 (H) 401987 (H) 027987 (H) 872 (H)   Component     Latest Ref Rng 04/06/2016  Hemoglobin A1C     4.0 - 6.0 % 13.7 (H)  Albumin     3.5 - 5.0 g/dL 2.6 (L)  Magnesium     1.7 - 2.4 mg/dL 1.8   Blood glucose values reviewed in glucose accordion view  ASSESSMENT:  Hypertriglyceridemia - improving Acute pancreatitis - resolved  Uncontrolled type 2 diabetes - uncontrolled  RECOMMENDATIONS:   The patient is doing well clinically and Triglyceride level is now <1000, so it would not be unreasonable to discharge her with close follow up.  I have recommended that she see me in the outpatient setting in 2 weeks and she is agreeable. She has the means to obtain her Lantus and metformin. [facility] so we will add that to her regimen for prandial coverage.  Start  metformin 500 mg twice daily. She will increase to 1000 mg twice daily after a few days. This was her previous dose. Reduce the Lantus dose to 40 units once daily. Start Regular insulin 8 units subQ before meals three times daily.  She should take additional regular insulin according to the sliding scale below for pre-meal hyperglycemia upon discharge: BG      Insulin dose 200-249 2 units 250-299 4 units 300-349 6 350-399 8 400-449 10 450-499 12 >500   14  May discontinue IV insulin infusion 1 hour after the first dose of subQ regular insulin.   Hypoglycemia protocol. Continue gemfibrozil 600 mg twice daily.   Will coordinate follow up outpatient, 2 weeks. Please provide prescriptions for Lantus, regular insulin,  metformin, and gemfibrozil. She has testing supplies at home. Thank you for this consult.  Doylene Canning, MD [facility] Endocrinology

## 2016-04-11 NOTE — Progress Notes (Signed)
1530 D/C home with mom via W/C.

## 2016-04-11 NOTE — Progress Notes (Signed)
MEDICATION RELATED CONSULT NOTE -follow up  Pharmacy Consult for electrolyte replacement Indication: low K  No Known Allergies  Patient Measurements: Height: 5\' 4"  (162.6 cm) Weight: 214 lb 4.6 oz (97.2 kg) IBW/kg (Calculated) : 54.7   Vital Signs: Temp: 98.2 F (36.8 C) (05/15 0000) Temp Source: Oral (05/15 0000) BP: 113/79 mmHg (05/15 0400) Pulse Rate: 81 (05/15 0500) Intake/Output from previous day: 05/14 0701 - 05/15 0700 In: 3627.1 [P.O.:600; I.V.:3027.1] Out: -  Intake/Output from this shift:    Labs:  Recent Labs  04/09/16 0446 04/10/16 0448 04/11/16 0618  CREATININE 0.31* 0.35* 0.30*  MG  --  1.7 1.6*  PHOS  --  2.9 4.5   Lab Results  Component Value Date   K 3.4* 04/11/2016   Estimated Creatinine Clearance: 110 mL/min (by C-G formula based on Cr of 0.3).   Assessment: 35 yo female on insulin drip for hypertriglyceridemia with hypokalemia.   Goal of Therapy:  K 3.5-5.0  Plan:  K 3.6 - patient remains on insulin drip. BMET in AM to follow up K.   0513 0446 K 3.3. Ordered potassium chloride 40 mEq PO x 2 doses. Will recheck electrolytes with AM labs.  0514 0448 K 3.4, Mg 1.7, Phos 2.9. Ordered potassium chloride 40 mEq po BID x 2 doses. Will recheck electrolytes with AM labs.  0515 AM K+=3.4, Mag=1.6, Phos= 4.5. Patient on KCL packet 40meq po bid. Will give Magnesium 2 gram IV x 1. Continue current KCL order. F/U with am labs.  Bari MantisKristin Claretha Townshend PharmD Clinical Pharmacist 04/11/2016

## 2016-04-19 ENCOUNTER — Ambulatory Visit: Payer: Self-pay | Attending: Internal Medicine

## 2016-08-20 IMAGING — US US ABDOMEN COMPLETE
1 series · 14 of 25 positions shown · non-contrast
Comparison: Abdominal ultrasound dated 02/16/2015 and CT dated
04/02/2011

CLINICAL DATA: 35-year-old female with abdominal pain. Elevated
lipase. History of pancreatitis.

EXAM:
ABDOMEN ULTRASOUND COMPLETE

[Series 1: us abdomen complete · 0.23mm/px · 14 of 74 slices shown]
[im 1/74]
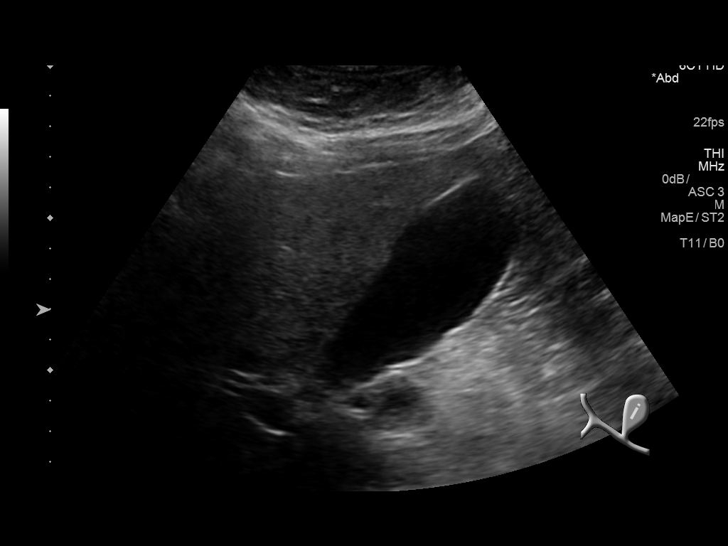
[im 7/74]
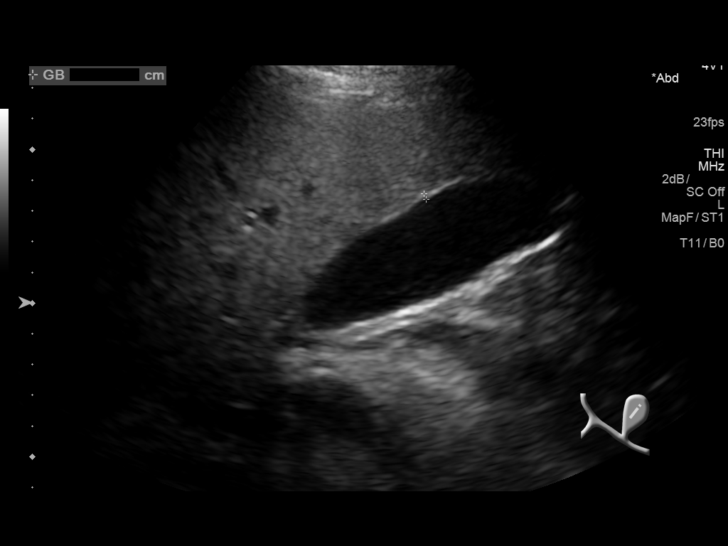
[im 13/74]
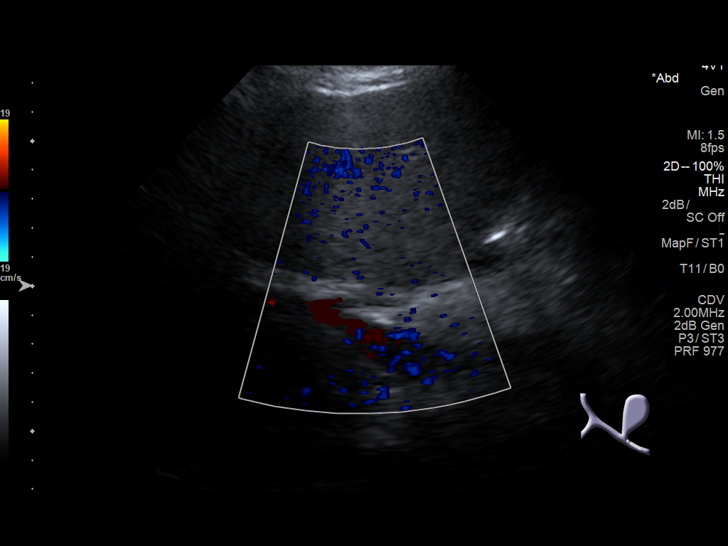
[im 19/74]
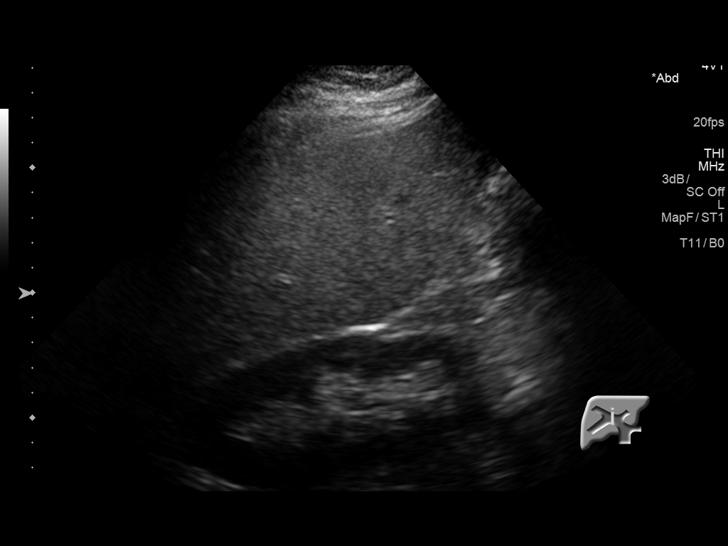
[im 25/74]
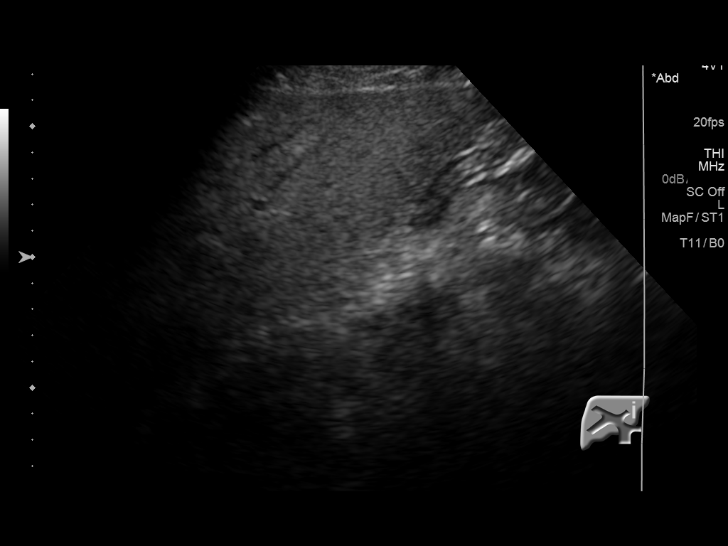
[im 28/74]
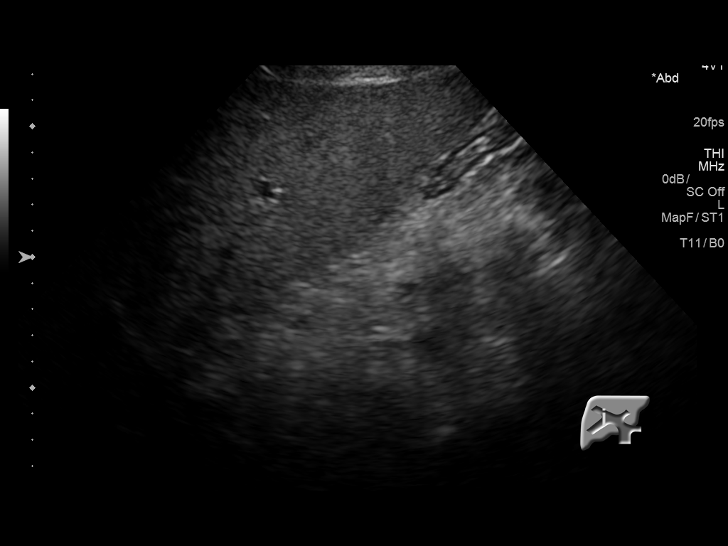
[im 34/74]
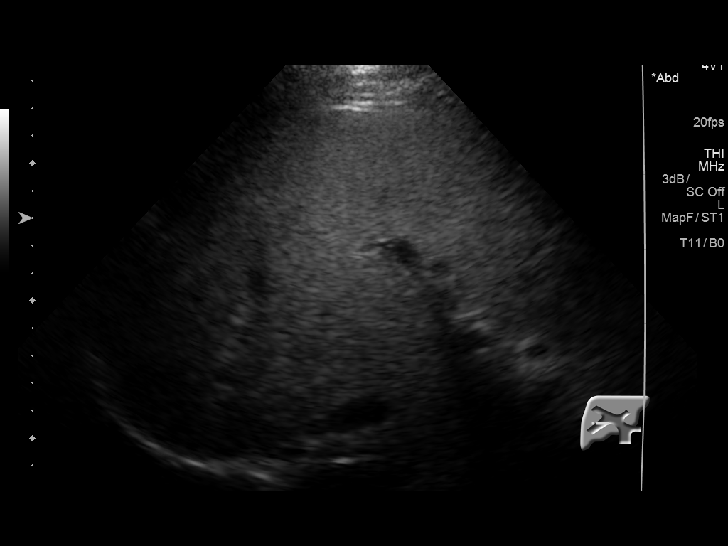
[im 40/74]
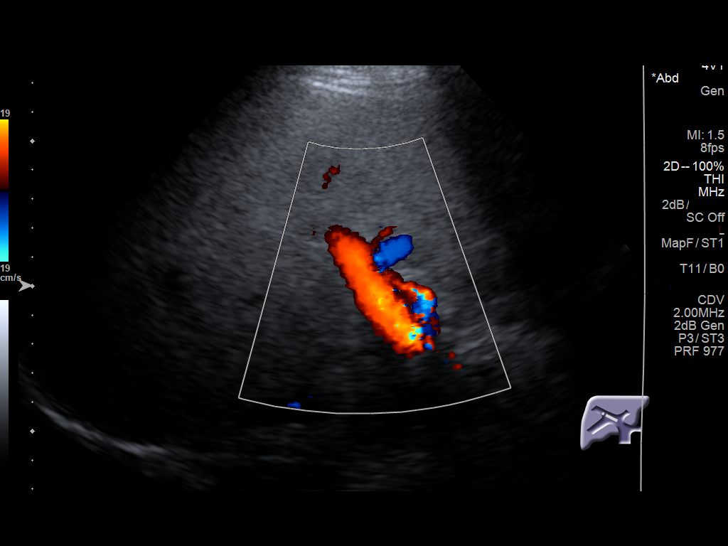
[im 46/74]
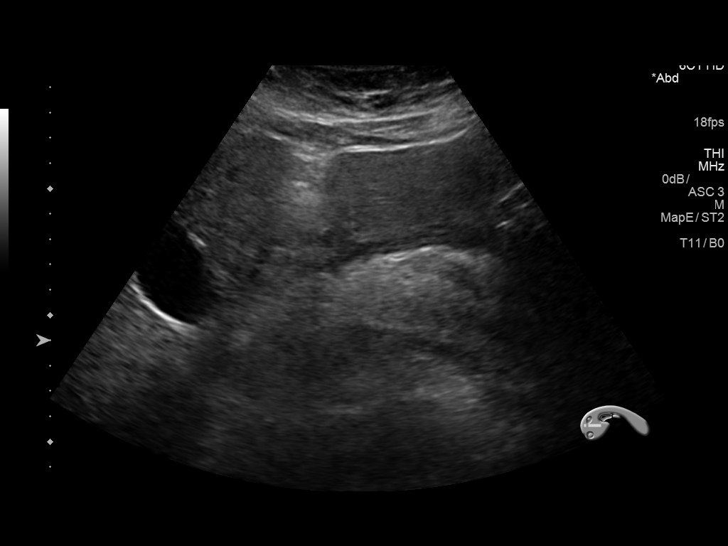
[im 49/74]
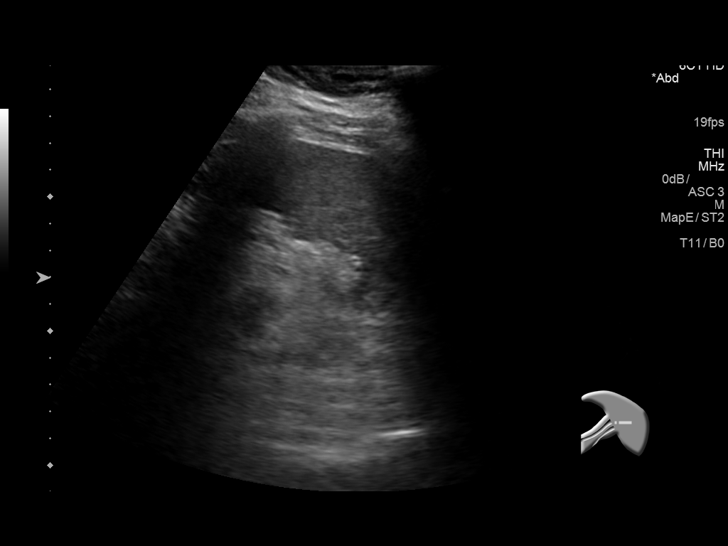
[im 55/74]
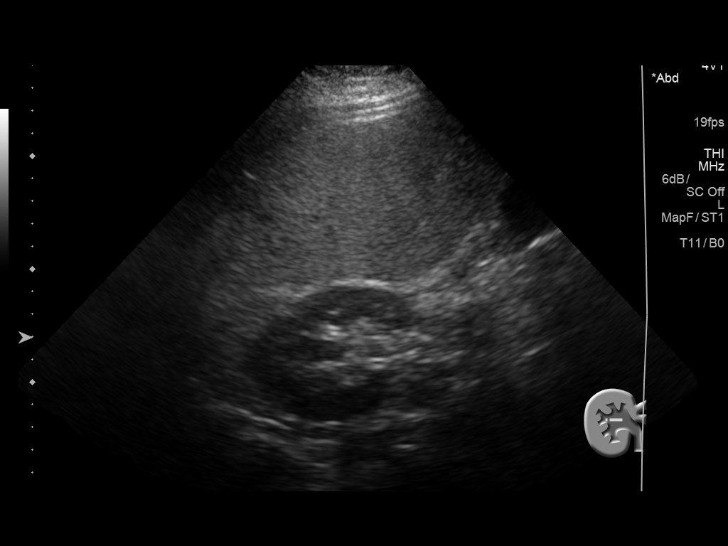
[im 61/74]
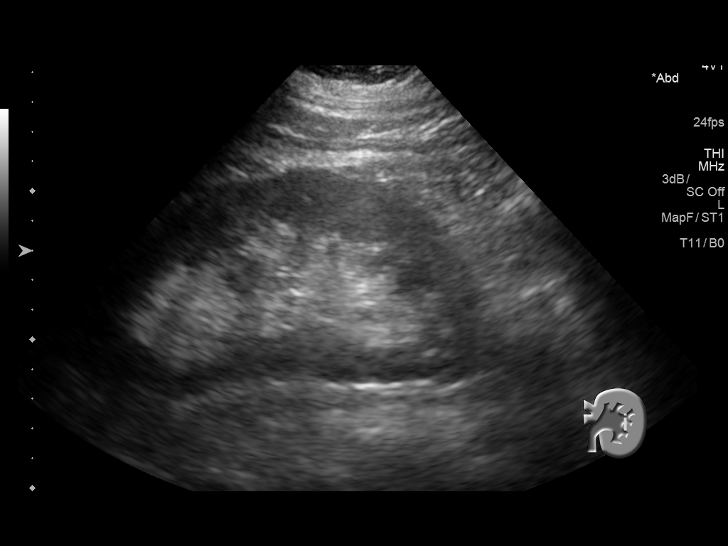
[im 67/74]
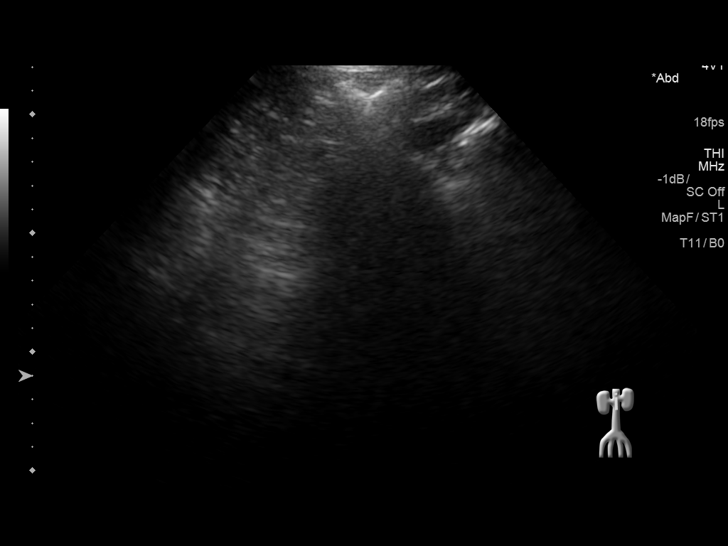
[im 74/74]
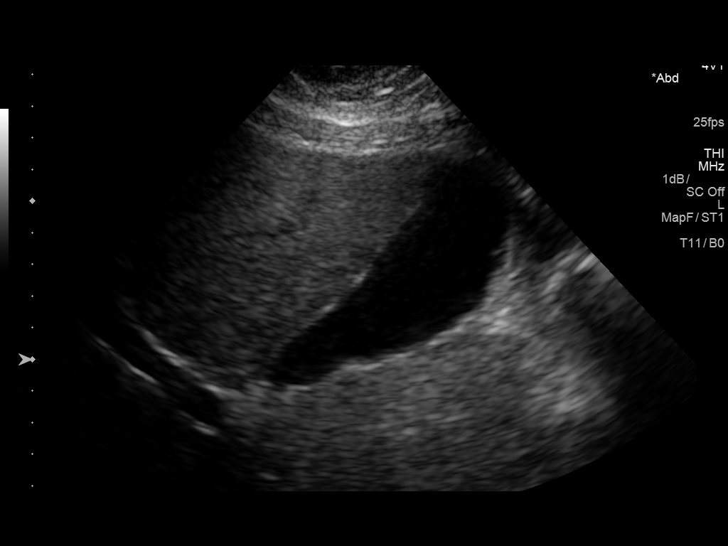

[14 of 25 positions shown; findings below may reference images not displayed]

FINDINGS: Gallbladder: No gallstones or wall thickening visualized. No
sonographic Murphy sign noted by sonographer.

Common bile duct: Diameter: 4 mm

Liver: There is diffuse increased hepatic echogenicity compatible
with fatty infiltration.

IVC: No abnormality visualized.

Pancreas: The pancreas appears enlarged with increased echogenicity.
Findings may represent pancreatitis. Correlation with pancreatic
enzymes recommended. The tail of the pancreas is not visualized.

Spleen: Size and appearance within normal limits.

Right Kidney: Length: 15 cm. Echogenicity within normal limits. No
mass or hydronephrosis visualized.

Left Kidney: Length: 14.5 cm. Echogenicity within normal limits. No
mass or hydronephrosis visualized.

Abdominal aorta: Not well visualized

Other findings: None.
IMPRESSION: Enlarged and echogenic pancreas. Findings may represent
pancreatitis. Correlation with pancreatic enzymes recommended.

Fatty liver.

## 2017-04-05 ENCOUNTER — Ambulatory Visit: Payer: Self-pay | Attending: Internal Medicine

## 2017-05-26 IMAGING — DX DG CHEST 1V PORT
1 series · 1 of 1 positions shown · non-contrast
Comparison: 10/09/2014

CLINICAL DATA: Abdominal pain and nausea.

EXAM:
PORTABLE CHEST 1 VIEW

[chest ap]
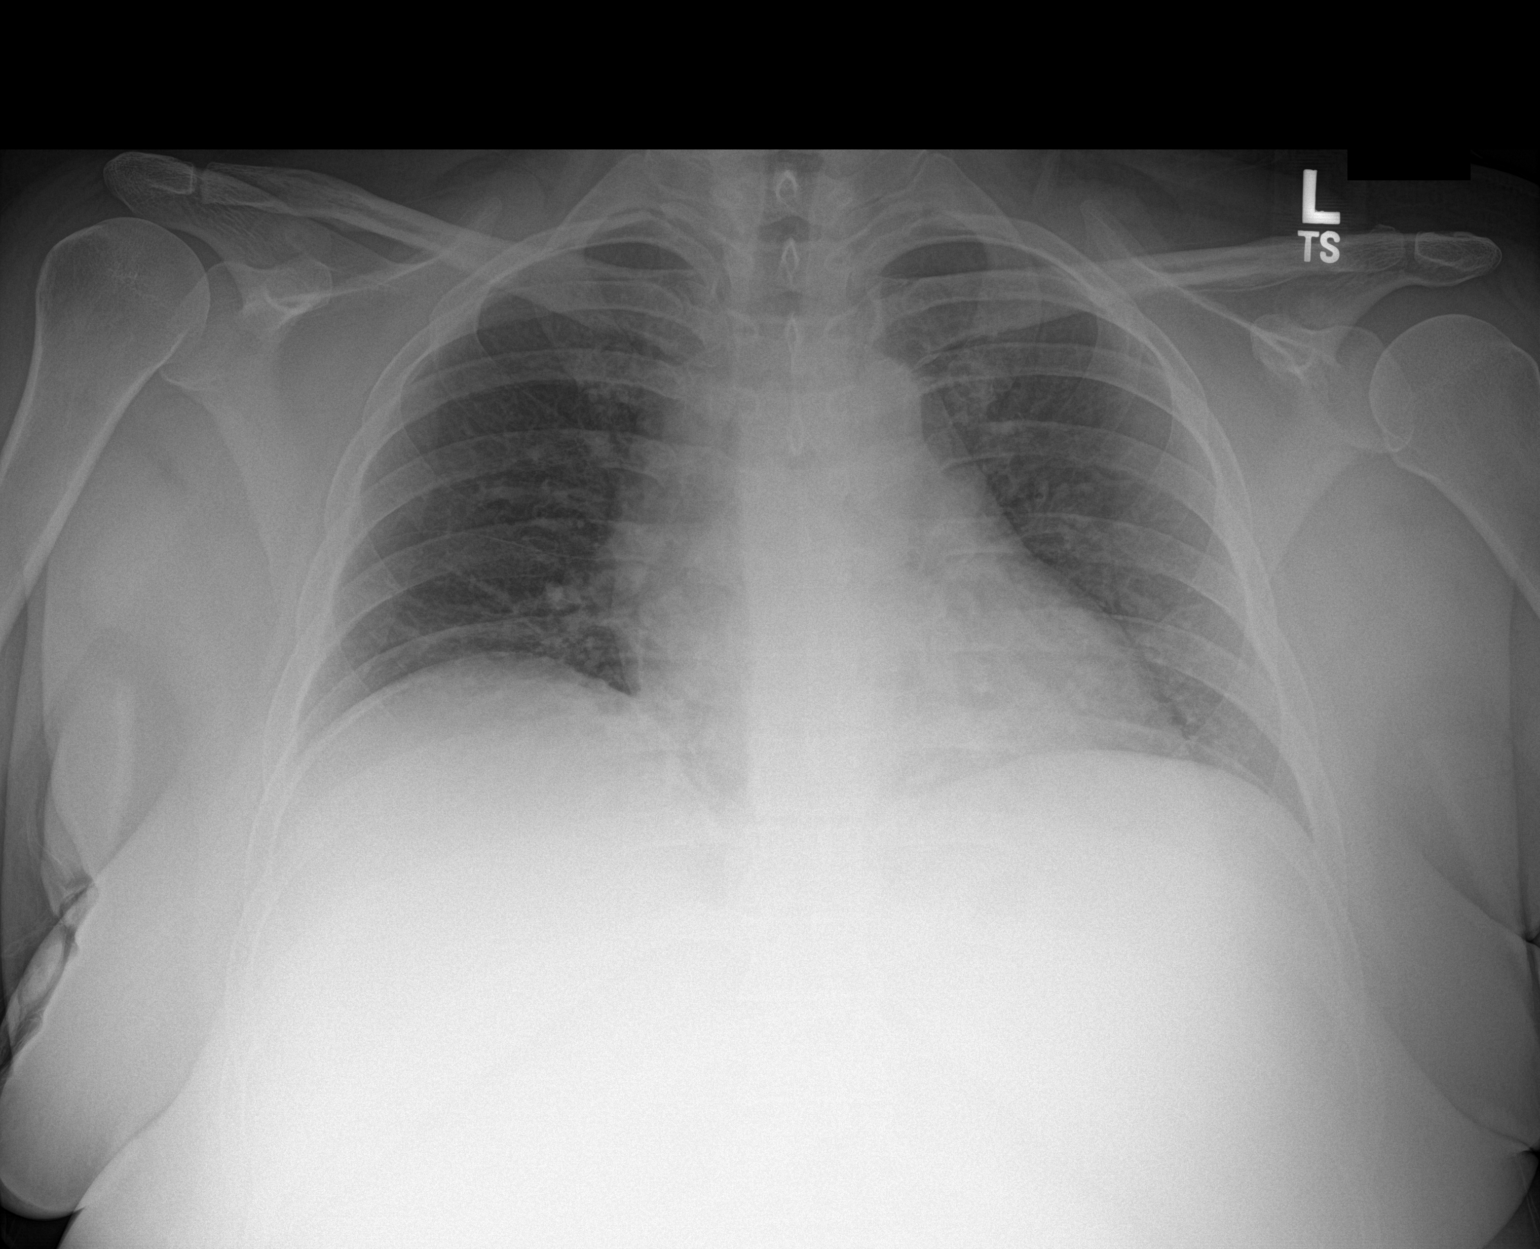

[1 of 1 positions shown; findings below may reference images not displayed]

FINDINGS: A single AP portable view of the chest demonstrates no focal
airspace consolidation or alveolar edema. The lungs are grossly
clear. There is no large effusion or pneumothorax. Cardiac and
mediastinal contours appear unremarkable.
IMPRESSION: No active disease.

## 2019-03-16 ENCOUNTER — Other Ambulatory Visit: Payer: Self-pay

## 2019-03-16 ENCOUNTER — Inpatient Hospital Stay
Admission: EM | Admit: 2019-03-16 | Discharge: 2019-03-21 | DRG: 440 | Discharge status: Against Medical Advice | Payer: BLUE CROSS/BLUE SHIELD | Attending: Internal Medicine | Admitting: Internal Medicine

## 2019-03-16 DIAGNOSIS — E119 Type 2 diabetes mellitus without complications: Secondary | ICD-10-CM | POA: Diagnosis present

## 2019-03-16 DIAGNOSIS — Z79899 Other long term (current) drug therapy: Secondary | ICD-10-CM

## 2019-03-16 DIAGNOSIS — Z7902 Long term (current) use of antithrombotics/antiplatelets: Secondary | ICD-10-CM | POA: Diagnosis not present

## 2019-03-16 DIAGNOSIS — Z794 Long term (current) use of insulin: Secondary | ICD-10-CM | POA: Diagnosis not present

## 2019-03-16 DIAGNOSIS — K859 Acute pancreatitis without necrosis or infection, unspecified: Secondary | ICD-10-CM | POA: Diagnosis present

## 2019-03-16 DIAGNOSIS — I10 Essential (primary) hypertension: Secondary | ICD-10-CM | POA: Diagnosis present

## 2019-03-16 DIAGNOSIS — E876 Hypokalemia: Secondary | ICD-10-CM | POA: Diagnosis present

## 2019-03-16 DIAGNOSIS — Z833 Family history of diabetes mellitus: Secondary | ICD-10-CM

## 2019-03-16 DIAGNOSIS — K858 Other acute pancreatitis without necrosis or infection: Principal | ICD-10-CM | POA: Diagnosis present

## 2019-03-16 DIAGNOSIS — Z7982 Long term (current) use of aspirin: Secondary | ICD-10-CM | POA: Diagnosis not present

## 2019-03-16 DIAGNOSIS — E781 Pure hyperglyceridemia: Secondary | ICD-10-CM

## 2019-03-16 DIAGNOSIS — Z6836 Body mass index (BMI) 36.0-36.9, adult: Secondary | ICD-10-CM | POA: Diagnosis not present

## 2019-03-16 DIAGNOSIS — R1010 Upper abdominal pain, unspecified: Secondary | ICD-10-CM | POA: Diagnosis not present

## 2019-03-16 DIAGNOSIS — E7800 Pure hypercholesterolemia, unspecified: Secondary | ICD-10-CM | POA: Diagnosis present

## 2019-03-16 DIAGNOSIS — E669 Obesity, unspecified: Secondary | ICD-10-CM | POA: Diagnosis not present

## 2019-03-16 DIAGNOSIS — Z87891 Personal history of nicotine dependence: Secondary | ICD-10-CM | POA: Diagnosis not present

## 2019-03-16 DIAGNOSIS — R112 Nausea with vomiting, unspecified: Secondary | ICD-10-CM | POA: Diagnosis not present

## 2019-03-16 DIAGNOSIS — E785 Hyperlipidemia, unspecified: Secondary | ICD-10-CM | POA: Diagnosis not present

## 2019-03-16 LAB — URINALYSIS, COMPLETE (UACMP) WITH MICROSCOPIC
Bilirubin Urine: NEGATIVE
Glucose, UA: >=500 mg/dL — AB
Hgb urine dipstick: NEGATIVE
Ketones, ur: 5 mg/dL — AB
Leukocytes,Ua: NEGATIVE
Nitrite: NEGATIVE
Protein, ur: 100 mg/dL — AB
Specific Gravity, Urine: 1.029 (ref 1.005–1.030)
pH: 5 (ref 5.0–8.0)

## 2019-03-16 LAB — CBC
HCT: 40 % (ref 36.0–46.0)
Hemoglobin: 14.3 g/dL (ref 12.0–15.0)
MCH: 34.8 pg — ABNORMAL HIGH (ref 26.0–34.0)
MCHC: 35.8 g/dL (ref 30.0–36.0)
MCV: 84.2 fL (ref 80.0–100.0)
Platelets: 368 10*3/uL (ref 150–400)
RBC: 4.74 MIL/uL (ref 3.87–5.11)
RDW: 13.3 % (ref 11.5–15.5)
WBC: 9.8 10*3/uL (ref 4.0–10.5)
nRBC: 0 % (ref 0.0–0.2)

## 2019-03-16 LAB — POCT PREGNANCY, URINE: Preg Test, Ur: NEGATIVE

## 2019-03-16 MED ORDER — GEMFIBROZIL 600 MG PO TABS: 600.0000 mg | ORAL_TABLET | Freq: Two times a day (BID) | ORAL | 1 refills | Status: DC

## 2019-03-16 MED ORDER — INSULIN GLARGINE 100 UNIT/ML ~~LOC~~ SOLN: 40.0000 [IU] | Freq: Every day | SUBCUTANEOUS | 11 refills | Status: DC

## 2019-03-16 MED ORDER — METFORMIN HCL 500 MG PO TABS: 500.0000 mg | ORAL_TABLET | Freq: Two times a day (BID) | ORAL | 0 refills | Status: DC

## 2019-03-16 MED ORDER — INSULIN REGULAR HUMAN 100 UNIT/ML IJ SOLN: 8.0000 [IU] | Freq: Three times a day (TID) | INTRAMUSCULAR | 11 refills | Status: DC

## 2019-03-16 MED ORDER — PRAVASTATIN SODIUM 10 MG PO TABS: 40.0000 mg | ORAL_TABLET | Freq: Every day | ORAL | 1 refills | Status: DC

## 2019-03-16 MED ORDER — SODIUM CHLORIDE 0.9 % IV BOLUS
1000.0000 mL | Freq: Once | INTRAVENOUS | Status: AC
  Administered 2019-03-16: 1000 mL via INTRAVENOUS

## 2019-03-16 MED ORDER — ONDANSETRON HCL 4 MG/2ML IJ SOLN
4.0000 mg | Freq: Once | INTRAMUSCULAR | Status: AC
  Administered 2019-03-16: 4 mg via INTRAVENOUS
  Filled 2019-03-16: qty 2

## 2019-03-16 MED ORDER — MORPHINE SULFATE (PF) 4 MG/ML IV SOLN
4.0000 mg | Freq: Once | INTRAVENOUS | Status: AC
  Administered 2019-03-16: 4 mg via INTRAVENOUS
  Filled 2019-03-16: qty 1

## 2019-03-16 NOTE — ED Notes (Signed)
Awaiting lab work

## 2019-03-16 NOTE — ED Provider Notes (Signed)
[facility] Emergency Department Provider Note       Time seen: ----------------------------------------- 10:12 PM on 03/16/2019 -----------------------------------------   I have reviewed the triage vital signs and the nursing notes.  HISTORY   Chief Complaint Abdominal Pain    HPI [name] is a 38 y.o. female with a history of diabetes, hyperlipidemia, hypertension and pancreatitis who presents to the ED for abdominal pain and back pain for the past couple weeks.  Patient states she has a history of pancreatitis and it feels similar.  Pain is 5 out of 10 in the upper abdomen.  She denies fever, or chills.  She has noted skin lesions which she states she has had before because her cholesterol levels are too high.  She is currently not on any lipid-lowering agent.  Past Medical History:  Diagnosis Date  . Diabetes mellitus   . Hypercholesteremia   . Hypertension   . Pancreatitis     Patient Active Problem List   Diagnosis Date Noted  . Acute pancreatitis 04/06/2016  . Dehydration 04/06/2016  . Other specified diabetes mellitus without complications (HCC) 10/21/2014  . History of pancreatitis 10/21/2014  . Essential hypertension 10/21/2014  . Hypertriglyceridemia 10/21/2014  . LACERATION OF FINGER 05/04/2009    Past Surgical History:  Procedure Laterality Date  . KNEE SURGERY      Allergies Patient has no known allergies.  Social History Social History   Tobacco Use  . Smoking status: Former Smoker  Substance Use Topics  . Alcohol use: No  . Drug use: No   Review of Systems Constitutional: Negative for fever. Cardiovascular: Negative for chest pain. Respiratory: Negative for shortness of breath. Gastrointestinal: Positive for abdominal pain Musculoskeletal: Positive for back pain Skin: Positive for rash Neurological: Negative for headaches, focal weakness or numbness.  All systems negative/normal/unremarkable except as  stated in the HPI  ____________________________________________   PHYSICAL EXAM:  VITAL SIGNS: ED Triage Vitals  Enc Vitals Group     BP 03/16/19 2138 (!) 161/87     Pulse Rate 03/16/19 2138 (!) 108     Resp 03/16/19 2138 20     Temp 03/16/19 2138 98.1 F (36.7 C)     Temp Source 03/16/19 2138 Oral     SpO2 03/16/19 2138 99 %     Weight 03/16/19 2136 210 lb (95.3 kg)     Height 03/16/19 2136 5\' 4"  (1.626 m)     Head Circumference --      Peak Flow --      Pain Score 03/16/19 2136 5     Pain Loc --      Pain Edu? --      Excl. in GC? --    Constitutional: Alert and oriented. Well appearing and in no distress. Cardiovascular: Normal rate, regular rhythm. No murmurs, rubs, or gallops. Respiratory: Normal respiratory effort without tachypnea nor retractions. Breath sounds are clear and equal bilaterally. No wheezes/rales/rhonchi. Gastrointestinal: Soft and nontender. Normal bowel sounds Musculoskeletal: Nontender with normal range of motion in extremities. No lower extremity tenderness nor edema. Neurologic:  Normal speech and language. No gross focal neurologic deficits are appreciated.  Skin: White papular lesions are appreciated on the trunk and extremities Psychiatric: Mood and affect are normal. Speech and behavior are normal.  ____________________________________________  ED COURSE:  As part of my medical decision making, I reviewed the following data within the electronic MEDICAL RECORD NUMBER History obtained from family if available, nursing notes, old chart and ekg, as well  as notes from prior ED visits. Patient presented for abdominal pain, we will assess with labs and imaging as indicated at this time.   Procedures  [name] was evaluated in Emergency Department on 03/16/2019 for the symptoms described in the history of present illness. She was evaluated in the context of the global COVID-19 pandemic, which necessitated consideration that the patient might be at risk  for infection with the SARS-CoV-2 virus that causes COVID-19. Institutional protocols and algorithms that pertain to the evaluation of patients at risk for COVID-19 are in a state of rapid change based on information released by regulatory bodies including the CDC and federal and state organizations. These policies and algorithms were followed during the patient's care in the ED.  ____________________________________________   LABS (pertinent positives/negatives)  Labs Reviewed  URINALYSIS, COMPLETE (UACMP) WITH MICROSCOPIC - Abnormal; Notable for the following components:      Result Value   Color, Urine YELLOW (*)    APPearance CLEAR (*)    Glucose, UA >=500 (*)    Ketones, ur 5 (*)    Protein, ur 100 (*)    Bacteria, UA FEW (*)    All other components within normal limits  LIPASE, BLOOD  COMPREHENSIVE METABOLIC PANEL  CBC  LIPID PANEL  POC URINE PREG, ED  POCT PREGNANCY, URINE  ____________________________________________   DIFFERENTIAL DIAGNOSIS   Pancreatitis, GERD, peptic ulcer disease, gas pain, constipation, musculoskeletal pain  FINAL ASSESSMENT AND PLAN  Abdominal pain, hyperlipidemia   Plan: The patient had presented for abdominal pain and back pain. Patient's labs are still pending at this time.  Suspect pancreatitis from hypertriglyceridemia   Ulice DashJohnathan E Williams, MD    Note: This note was generated in part or whole with voice recognition software. Voice recognition is usually quite accurate but there are transcription errors that can and very often do occur. I apologize for any typographical errors that were not detected and corrected.     Emily FilbertWilliams, Jonathan E, MD 03/16/19 2259

## 2019-03-16 NOTE — ED Notes (Signed)
Blood specimen collection successful. PIV insertion unsuccessful. Lurena Joiner RN to bedside to attempt PIV insertion.

## 2019-03-16 NOTE — ED Triage Notes (Signed)
Patient reports having abdominal pain and back pain for couple weeks.  Reports history of pancreatitis and feels like this is the same.

## 2019-03-16 NOTE — ED Notes (Signed)
Phlebotomy tech at bedside

## 2019-03-17 DIAGNOSIS — K858 Other acute pancreatitis without necrosis or infection: Secondary | ICD-10-CM | POA: Diagnosis present

## 2019-03-17 DIAGNOSIS — E669 Obesity, unspecified: Secondary | ICD-10-CM | POA: Diagnosis not present

## 2019-03-17 DIAGNOSIS — Z87891 Personal history of nicotine dependence: Secondary | ICD-10-CM | POA: Diagnosis not present

## 2019-03-17 DIAGNOSIS — Z833 Family history of diabetes mellitus: Secondary | ICD-10-CM | POA: Diagnosis not present

## 2019-03-17 DIAGNOSIS — Z7902 Long term (current) use of antithrombotics/antiplatelets: Secondary | ICD-10-CM | POA: Diagnosis not present

## 2019-03-17 DIAGNOSIS — E876 Hypokalemia: Secondary | ICD-10-CM | POA: Diagnosis present

## 2019-03-17 DIAGNOSIS — R112 Nausea with vomiting, unspecified: Secondary | ICD-10-CM | POA: Diagnosis present

## 2019-03-17 DIAGNOSIS — Z794 Long term (current) use of insulin: Secondary | ICD-10-CM | POA: Diagnosis not present

## 2019-03-17 DIAGNOSIS — K859 Acute pancreatitis without necrosis or infection, unspecified: Secondary | ICD-10-CM | POA: Diagnosis not present

## 2019-03-17 DIAGNOSIS — E785 Hyperlipidemia, unspecified: Secondary | ICD-10-CM | POA: Diagnosis present

## 2019-03-17 DIAGNOSIS — Z6836 Body mass index (BMI) 36.0-36.9, adult: Secondary | ICD-10-CM | POA: Diagnosis not present

## 2019-03-17 DIAGNOSIS — E119 Type 2 diabetes mellitus without complications: Secondary | ICD-10-CM | POA: Diagnosis not present

## 2019-03-17 DIAGNOSIS — I10 Essential (primary) hypertension: Secondary | ICD-10-CM | POA: Diagnosis present

## 2019-03-17 DIAGNOSIS — Z7982 Long term (current) use of aspirin: Secondary | ICD-10-CM | POA: Diagnosis not present

## 2019-03-17 DIAGNOSIS — E7800 Pure hypercholesterolemia, unspecified: Secondary | ICD-10-CM | POA: Diagnosis present

## 2019-03-17 DIAGNOSIS — Z79899 Other long term (current) drug therapy: Secondary | ICD-10-CM | POA: Diagnosis not present

## 2019-03-17 DIAGNOSIS — E781 Pure hyperglyceridemia: Secondary | ICD-10-CM | POA: Diagnosis not present

## 2019-03-17 LAB — COMPREHENSIVE METABOLIC PANEL
ALT: 14 U/L (ref 0–44)
AST: 19 U/L (ref 15–41)
Albumin: 3.4 g/dL — ABNORMAL LOW (ref 3.5–5.0)
Alkaline Phosphatase: 57 U/L (ref 38–126)
Anion gap: 13 (ref 5–15)
BUN: 14 mg/dL (ref 6–20)
CO2: 18 mmol/L — ABNORMAL LOW (ref 22–32)
Calcium: 8.5 mg/dL — ABNORMAL LOW (ref 8.9–10.3)
Chloride: 106 mmol/L (ref 98–111)
Creatinine, Ser: 0.38 mg/dL — ABNORMAL LOW (ref 0.44–1.00)
GFR calc Af Amer: >60 mL/min (ref >60)
GFR calc non Af Amer: >60 mL/min (ref >60)
Glucose, Bld: 199 mg/dL — ABNORMAL HIGH (ref 70–99)
Potassium: 3.9 mmol/L (ref 3.5–5.1)
Sodium: 137 mmol/L (ref 135–145)
Total Bilirubin: 0.5 mg/dL (ref 0.3–1.2)
Total Protein: 6.8 g/dL (ref 6.5–8.1)

## 2019-03-17 LAB — RENAL FUNCTION PANEL
Albumin: 3.3 g/dL — ABNORMAL LOW (ref 3.5–5.0)
Anion gap: 6 (ref 5–15)
BUN: 7 mg/dL (ref 6–20)
CO2: 18 mmol/L — ABNORMAL LOW (ref 22–32)
Calcium: 7.8 mg/dL — ABNORMAL LOW (ref 8.9–10.3)
Chloride: 107 mmol/L (ref 98–111)
Creatinine, Ser: <0.3 mg/dL — ABNORMAL LOW (ref 0.44–1.00)
Glucose, Bld: 120 mg/dL — ABNORMAL HIGH (ref 70–99)
Phosphorus: 3.4 mg/dL (ref 2.5–4.6)
Potassium: 4.5 mmol/L (ref 3.5–5.1)
Sodium: 131 mmol/L — ABNORMAL LOW (ref 135–145)

## 2019-03-17 LAB — LIPID PANEL
Cholesterol: 688 mg/dL — ABNORMAL HIGH (ref 0–200)
HDL: 26 mg/dL — ABNORMAL LOW (ref >40)
LDL Cholesterol: UNDETERMINED mg/dL (ref 0–99)
Triglycerides: >5000 mg/dL — ABNORMAL HIGH (ref <150)
VLDL: UNDETERMINED mg/dL (ref 0–40)

## 2019-03-17 LAB — BLOOD GAS, ARTERIAL
Acid-Base Excess: 1.5 mmol/L (ref 0.0–2.0)
Bicarbonate: 25.9 mmol/L (ref 20.0–28.0)
FIO2: 0.21
O2 Saturation: 96.1 %
Patient temperature: 37
pCO2 arterial: 39 mmHg (ref 32.0–48.0)
pH, Arterial: 7.43 (ref 7.350–7.450)
pO2, Arterial: 80 mmHg — ABNORMAL LOW (ref 83.0–108.0)

## 2019-03-17 LAB — MRSA PCR SCREENING: MRSA by PCR: NEGATIVE

## 2019-03-17 LAB — TSH: TSH: 4.648 u[IU]/mL — ABNORMAL HIGH (ref 0.350–4.500)

## 2019-03-17 LAB — GLUCOSE, CAPILLARY
Glucose-Capillary: 143 mg/dL — ABNORMAL HIGH (ref 70–99)
Glucose-Capillary: 172 mg/dL — ABNORMAL HIGH (ref 70–99)
Glucose-Capillary: 135 mg/dL — ABNORMAL HIGH (ref 70–99)
Glucose-Capillary: 117 mg/dL — ABNORMAL HIGH (ref 70–99)
Glucose-Capillary: 112 mg/dL — ABNORMAL HIGH (ref 70–99)
Glucose-Capillary: 91 mg/dL (ref 70–99)
Glucose-Capillary: 129 mg/dL — ABNORMAL HIGH (ref 70–99)
Glucose-Capillary: 122 mg/dL — ABNORMAL HIGH (ref 70–99)
Glucose-Capillary: 120 mg/dL — ABNORMAL HIGH (ref 70–99)
Glucose-Capillary: 105 mg/dL — ABNORMAL HIGH (ref 70–99)
Glucose-Capillary: 113 mg/dL — ABNORMAL HIGH (ref 70–99)
Glucose-Capillary: 143 mg/dL — ABNORMAL HIGH (ref 70–99)
Glucose-Capillary: 96 mg/dL (ref 70–99)

## 2019-03-17 LAB — TRIGLYCERIDES
Triglycerides: >5000 mg/dL — ABNORMAL HIGH (ref <150)
Triglycerides: 4878 mg/dL — ABNORMAL HIGH (ref <150)

## 2019-03-17 LAB — LDL CHOLESTEROL, DIRECT: Direct LDL: <10 mg/dL (ref 0–99)

## 2019-03-17 LAB — LIPASE, BLOOD: Lipase: 19 U/L (ref 11–51)

## 2019-03-17 MED ORDER — ONDANSETRON HCL 4 MG PO TABS: 4.0000 mg | ORAL_TABLET | Freq: Four times a day (QID) | ORAL | Status: DC | PRN

## 2019-03-17 MED ORDER — ONDANSETRON HCL 4 MG/2ML IJ SOLN
4.0000 mg | Freq: Four times a day (QID) | INTRAMUSCULAR | Status: DC | PRN
  Administered 2019-03-17: 4 mg via INTRAVENOUS
  Filled 2019-03-17: qty 2

## 2019-03-17 MED ORDER — DEXTROSE 5 % IV SOLN
INTRAVENOUS | Status: DC
  Administered 2019-03-17 – 2019-03-18 (×2): via INTRAVENOUS

## 2019-03-17 MED ORDER — HEPARIN SODIUM (PORCINE) 5000 UNIT/ML IJ SOLN
5000.0000 [IU] | Freq: Three times a day (TID) | INTRAMUSCULAR | Status: DC
  Administered 2019-03-17 – 2019-03-18 (×4): 5000 [IU] via SUBCUTANEOUS
  Filled 2019-03-17 (×4): qty 1

## 2019-03-17 MED ORDER — PROCHLORPERAZINE EDISYLATE 10 MG/2ML IJ SOLN
10.0000 mg | INTRAMUSCULAR | Status: DC | PRN
  Filled 2019-03-17: qty 2

## 2019-03-17 MED ORDER — GEMFIBROZIL 600 MG PO TABS
600.0000 mg | ORAL_TABLET | Freq: Two times a day (BID) | ORAL | Status: DC
  Administered 2019-03-17 – 2019-03-21 (×9): 600 mg via ORAL
  Filled 2019-03-17 (×9): qty 1

## 2019-03-17 MED ORDER — ACETAMINOPHEN 650 MG RE SUPP: 650.0000 mg | Freq: Four times a day (QID) | RECTAL | Status: DC | PRN

## 2019-03-17 MED ORDER — ACETAMINOPHEN 325 MG PO TABS
650.0000 mg | ORAL_TABLET | Freq: Four times a day (QID) | ORAL | Status: DC | PRN
  Administered 2019-03-17: 650 mg via ORAL
  Filled 2019-03-17: qty 2

## 2019-03-17 MED ORDER — INSULIN (MYXREDLIN) INFUSION FOR HYPERTRIGLYCERIDEMIA
0.1000 [IU]/kg/h | INTRAVENOUS | Status: DC
  Administered 2019-03-17 – 2019-03-19 (×6): 0.1 [IU]/kg/h via INTRAVENOUS
  Filled 2019-03-17 (×8): qty 100

## 2019-03-17 MED ORDER — LORATADINE 10 MG PO TABS
10.0000 mg | ORAL_TABLET | Freq: Every day | ORAL | Status: DC
  Administered 2019-03-17 – 2019-03-21 (×5): 10 mg via ORAL
  Filled 2019-03-17 (×5): qty 1

## 2019-03-17 MED ORDER — MORPHINE SULFATE (PF) 2 MG/ML IV SOLN
1.0000 mg | INTRAVENOUS | Status: DC | PRN
  Administered 2019-03-17 (×2): 2 mg via INTRAVENOUS
  Administered 2019-03-18: 1 mg via INTRAVENOUS
  Filled 2019-03-17 (×3): qty 1

## 2019-03-17 MED ORDER — DOCUSATE SODIUM 100 MG PO CAPS
100.0000 mg | ORAL_CAPSULE | Freq: Two times a day (BID) | ORAL | Status: DC
  Administered 2019-03-17 (×2): 100 mg via ORAL
  Filled 2019-03-17 (×3): qty 1

## 2019-03-17 MED ORDER — METOPROLOL TARTRATE 5 MG/5ML IV SOLN
2.5000 mg | Freq: Once | INTRAVENOUS | Status: DC
  Filled 2019-03-17: qty 5

## 2019-03-17 NOTE — ED Provider Notes (Signed)
-----------------------------------------   12:26 AM on 03/17/2019 -----------------------------------------  Patient was given IV morphine, Zofran and fluids.  Pain decreased to 2/10.  Delay in lab resulting patient's chemistries due to lipemic sample.   ----------------------------------------- 3:00 AM on 03/17/2019 -----------------------------------------  Lab tech reports specimen still in progress but she is able to give me a ballpark of patient's triglycerides of 6200 on a 1: 100 dilution.  Updated patient.  Will recheck blood pressure and administer Lopressor if it remains high.  Discussed with hospitalist Dr. Sheryle Hail who will evaluate patient in the emergency department for admission.   Irean Hong, MD 03/17/19 [phone]

## 2019-03-17 NOTE — ED Notes (Signed)
Called lab to check status of results. Lab tech reports specimens still in progress.

## 2019-03-17 NOTE — H&P (Signed)
[name] is an 37 y.o. female.   Chief Complaint: Abdominal pain HPI: The patient with past medical history of hypertriglyceridemia, diabetes and hypertension presents to the emergency department complaining of abdominal pain.  The patient has had epigastric pain radiating to her back for a few weeks.  She has had pain like this before when she was diagnosed with pancreatitis.  In the emergency department her blood was difficult to draw and was found to be very milky in appearance.  The laboratory had difficulty obtaining values for complete metabolic panel but reported triglycerides greater than 60,000.  Due to her symptoms and history of hypertriglyceridemia induced pancreatitis the emergency department staff called the hospitalist service for admission.  Past Medical History:  Diagnosis Date  . Diabetes mellitus   . Hypercholesteremia   . Hypertension   . Pancreatitis     Past Surgical History:  Procedure Laterality Date  . KNEE SURGERY      Family History  Problem Relation Age of Onset  . Cancer Other   . Diabetes Other   . Hypertension Other   . Cancer Paternal Grandfather    Social History:  reports that she has quit smoking. She does not have any smokeless tobacco history on file. She reports that she does not drink alcohol or use drugs.  Allergies: No Known Allergies  Medications Prior to Admission  Medication Sig Dispense Refill  . acetaminophen (TYLENOL) 500 MG tablet Take 1,000-1,500 mg by mouth every 6 (six) hours as needed for mild pain.     Marland Kitchen insulin glargine (LANTUS) 100 UNIT/ML injection Inject 60 Units into the skin 2 (two) times daily.      Results for orders placed or performed during the hospital encounter of 03/16/19 (from the past 48 hour(s))  CBC     Status: Abnormal   Collection Time: 03/16/19  9:45 PM  Result Value Ref Range   WBC 9.8 4.0 - 10.5 K/uL   RBC 4.74 3.87 - 5.11 MIL/uL   Hemoglobin 14.3 12.0 - 15.0 g/dL   HCT 95.6 21.3 - 08.6 %   MCV  84.2 80.0 - 100.0 fL   MCH 34.8 (H) 26.0 - 34.0 pg   MCHC 35.8 30.0 - 36.0 g/dL    Comment: CORRECTED FOR LIPEMIA   RDW 13.3 11.5 - 15.5 %   Platelets 368 150 - 400 K/uL   nRBC 0.0 0.0 - 0.2 %    Comment: Performed at [facility], [address] [place], [place] [address]  Urinalysis, Complete w Microscopic     Status: Abnormal   Collection Time: 03/16/19  9:45 PM  Result Value Ref Range   Color, Urine YELLOW (A) YELLOW   APPearance CLEAR (A) CLEAR   Specific Gravity, Urine 1.029 1.005 - 1.030   pH 5.0 5.0 - 8.0   Glucose, UA >=500 (A) NEGATIVE mg/dL   Hgb urine dipstick NEGATIVE NEGATIVE   Bilirubin Urine NEGATIVE NEGATIVE   Ketones, ur 5 (A) NEGATIVE mg/dL   Protein, ur 962 (A) NEGATIVE mg/dL   Nitrite NEGATIVE NEGATIVE   Leukocytes,Ua NEGATIVE NEGATIVE   RBC / HPF 0-5 0 - 5 RBC/hpf   WBC, UA 6-10 0 - 5 WBC/hpf   Bacteria, UA FEW (A) NONE SEEN   Squamous Epithelial / LPF 0-5 0 - 5   Mucus PRESENT     Comment: Performed at [facility], [address], [place], [place] [address]  Pregnancy, urine POC     Status: None  Collection Time: 03/16/19  9:53 PM  Result Value Ref Range   Preg Test, Ur NEGATIVE NEGATIVE    Comment:        THE SENSITIVITY OF THIS METHODOLOGY IS >24 mIU/mL   Comprehensive metabolic panel     Status: Abnormal   Collection Time: 03/16/19 11:40 PM  Result Value Ref Range   Sodium 137 135 - 145 mmol/L   Potassium 3.9 3.5 - 5.1 mmol/L   Chloride 106 98 - 111 mmol/L   CO2 18 (L) 22 - 32 mmol/L   Glucose, Bld 199 (H) 70 - 99 mg/dL   BUN 14 6 - 20 mg/dL   Creatinine, Ser 1.610.38 (L) 0.44 - 1.00 mg/dL   Calcium 8.5 (L) 8.9 - 10.3 mg/dL   Total Protein 6.8 6.5 - 8.1 g/dL   Albumin 3.4 (L) 3.5 - 5.0 g/dL   AST 19 15 - 41 U/L   ALT 14 0 - 44 U/L   Alkaline Phosphatase 57 38 - 126 U/L   Total Bilirubin 0.5 0.3 - 1.2 mg/dL   GFR calc non Af Amer >60 >60 mL/min   GFR calc Af Amer >60 >60 mL/min   Anion gap 13 5 - 15    Comment:  Performed at [facility], [address] [place], [place] [address]  Lipase, blood     Status: None   Collection Time: 03/16/19 11:40 PM  Result Value Ref Range   Lipase 19 11 - 51 U/L    Comment: Performed at [facility], [address] [place], [place] [address]  Lipid panel     Status: Abnormal   Collection Time: 03/16/19 11:40 PM  Result Value Ref Range   Cholesterol 688 (H) 0 - 200 mg/dL    Comment: RESULT CONFIRMED BY MANUAL DILUTION RDW   Triglycerides >5,000 (H) <150 mg/dL    Comment: CORRECTED ON 04/19 AT [id number]: PREVIOUSLY REPORTED AS 895 RESULT CONFIRMED BY MANUAL DILUTION RDW   HDL 26 (L) >40 mg/dL   Total CHOL/HDL Ratio NOT REPORTED DUE TO HIGH TRIGLYCERIDES RATIO    Comment: CORRECTED ON 04/19 AT [id number]: PREVIOUSLY REPORTED AS 26.5   VLDL UNABLE TO CALCULATE IF TRIGLYCERIDE OVER 400 mg/dL 0 - 40 mg/dL   LDL Cholesterol NOT CALCULATED 0 - 99 mg/dL    Comment: Performed at [facility], [address] [place], [place] [address]  TSH     Status: Abnormal   Collection Time: 03/16/19 11:40 PM  Result Value Ref Range   TSH 4.648 (H) 0.350 - 4.500 uIU/mL    Comment: Performed by a 3rd Generation assay with a functional sensitivity of <=0.01 uIU/mL. Performed at [facility], [address] [place], [place] [address]   Glucose, capillary     Status: Abnormal   Collection Time: 03/17/19  3:07 AM  Result Value Ref Range   Glucose-Capillary 143 (H) 70 - 99 mg/dL  Glucose, capillary     Status: Abnormal   Collection Time: 03/17/19  4:03 AM  Result Value Ref Range   Glucose-Capillary 172 (H) 70 - 99 mg/dL   No results found.  Review of Systems  Constitutional: Negative for chills and fever.  HENT: Negative for sore throat and tinnitus.   Eyes: Negative for blurred vision and redness.  Respiratory: Negative for cough and shortness of breath.   Cardiovascular: Negative for chest pain, palpitations, orthopnea and PND.   Gastrointestinal: Positive for abdominal pain, nausea and vomiting. Negative for diarrhea.  Genitourinary: Negative  for dysuria, frequency and urgency.  Musculoskeletal: Negative for joint pain and myalgias.  Skin: Negative for rash.       No lesions  Neurological: Negative for speech change, focal weakness and weakness.  Endo/Heme/Allergies: Does not bruise/bleed easily.       No temperature intolerance  Psychiatric/Behavioral: Negative for depression and suicidal ideas.    Blood pressure (!) 147/86, pulse 99, temperature 98.5 F (36.9 C), temperature source Oral, resp. rate 17, height  (1.626 m), weight 95.3 kg, last menstrual period 03/05/2019, SpO2 95 %. Physical Exam  Vitals reviewed. Constitutional: She is oriented to person, place, and time. She appears well-developed and well-nourished. No distress.  HENT:  Head: Normocephalic and atraumatic.  Mouth/Throat: Oropharynx is clear and moist.  Eyes: Pupils are equal, round, and reactive to light. Conjunctivae and EOM are normal. No scleral icterus.  Neck: Normal range of motion. Neck supple. No JVD present. No tracheal deviation present. No thyromegaly present.  Cardiovascular: Normal rate, regular rhythm and normal heart sounds. Exam reveals no gallop and no friction rub.  No murmur heard. Respiratory: Effort normal and breath sounds normal.  GI: Soft. Bowel sounds are normal. She exhibits no distension and no mass. There is abdominal tenderness. There is no rebound and no guarding.  Genitourinary:    Genitourinary Comments: Deferred   Musculoskeletal: Normal range of motion.        General: No edema.  Lymphadenopathy:    She has no cervical adenopathy.  Neurological: She is alert and oriented to person, place, and time. No cranial nerve deficit. She exhibits normal muscle tone.  Skin: Skin is warm and dry. No rash noted. No erythema.  Psychiatric: She has a normal mood and affect. Her behavior is normal. Judgment and  thought content normal.     Assessment/Plan This is a 38 year old female admitted for pancreatitis. 1.  Pancreatitis: Clear liquid diet; morphine as needed for severe pain.  The patient has persistent tachycardia but no indication of sepsis.  BUN within normal limits.  Calcium is mildly low.  Consider imaging to rule out abscess or pseudocyst. 2.  Hypertriglyceridemia: Start insulin drip.  Monitor in ICU setting 3.  Diabetes mellitus type 2: Once patient is off insulin drip adjust diabetic regimen and restart sliding scale 4.  Obesity: BMI is 36; encourage healthy diet and exercise 5.  DVT prophylaxis: Lovenox 6.  GI prophylaxis: PPI as needed The patient is a full code.  I personally spent 45 minutes in critical care time with this patient.  Arnaldo Natal, MD 03/17/2019, 4:15 AM

## 2019-03-17 NOTE — ED Notes (Signed)
Patient c/o upper abdominal pain radiating to back. Patient reports hx of pancreatitis - states this feels the same.   Patient reports she is noncompliant on all medications with the exception of her lantus and novolog due to lack of insurance.

## 2019-03-17 NOTE — Consult Note (Signed)
PULMONARY / CRITICAL CARE MEDICINE  Name: [name] CadetBrandy Knoche MRN: [id number] DOB: 02/07/1981    LOS: 0  Referring Provider:  Dr Sheryle Hailiamond Reason for Referral:  Acute pancreatitis due to elevated triglycerides  HPI:  38 y/o female with a medical history as indicated below who presented to the ED with abdominal pain and back pain for a couple of weeks. Her ED work up reveal a triglyceride level >5000. She is being admitted to the ICU for an insulin gtte This is a recurrent problem. Patient is non-adherent to prescribed treatment  Past Medical History:  Diagnosis Date  . Diabetes mellitus   . Hypercholesteremia   . Hypertension   . Pancreatitis    Past Surgical History:  Procedure Laterality Date  . KNEE SURGERY     Prior to Admission medications   Medication Sig Start Date End Date Taking? Authorizing Provider  amLODipine (NORVASC) 5 MG tablet Take 5 mg by mouth daily.   Yes [provider]  clopidogrel (PLAVIX) 75 MG tablet Take 75 mg by mouth daily.   Yes [provider]  donepezil (ARICEPT) 5 MG tablet Take 1 tablet (5 mg total) by mouth at bedtime. 07/23/18 09/01/18 Yes Sowles, Danna HeftyKrichna, MD  empagliflozin (JARDIANCE) 25 MG TABS tablet Take 25 mg by mouth daily.   Yes [provider]  glycopyrrolate (ROBINUL) 1 MG tablet Take 1 mg by mouth 2 (two) times daily.   Yes [provider]  insulin aspart (NOVOLOG FLEXPEN) 100 UNIT/ML FlexPen Inject 12 Units into the skin 2 (two) times daily.   Yes [provider]  insulin aspart (NOVOLOG) 100 UNIT/ML FlexPen Inject 18 Units into the skin daily. At 1700   Yes [provider]  Insulin Degludec-Liraglutide (XULTOPHY) 100-3.6 UNIT-MG/ML SOPN Inject 50 Units into the skin daily.   Yes [provider]  levETIRAcetam (KEPPRA) 500 MG tablet Take 500 mg by mouth 2 (two) times daily.   Yes [provider]  lipase/protease/amylase (CREON) 12000 units CPEP capsule Take 6,000 Units by  mouth 3 (three) times daily before meals.   Yes [provider]  lipase/protease/amylase (CREON) 12000 units CPEP capsule Take 3,000 Units by mouth at bedtime. With snack   Yes [provider]  lisinopril (PRINIVIL,ZESTRIL) 5 MG tablet Take 5 mg by mouth daily.   Yes [provider]  metoprolol succinate (TOPROL-XL) 25 MG 24 hr tablet Take 1 tablet (25 mg total) by mouth daily. 07/23/18  Yes Sowles, Danna HeftyKrichna, MD  rosuvastatin (CRESTOR) 40 MG tablet Take 1 tablet (40 mg total) by mouth daily. 07/23/18 09/01/18 Yes Alba CorySowles, Krichna, MD  aspirin EC 81 MG tablet Take 81 mg by mouth daily.    [provider]  famotidine (PEPCID) 20 MG tablet Take 1 tablet (20 mg total) by mouth 2 (two) times daily. 07/23/18 08/22/18  Alba CorySowles, Krichna, MD  gabapentin (NEURONTIN) 300 MG capsule Take 1 capsule (300 mg total) by mouth 2 (two) times daily. 07/23/18 08/22/18  Alba CorySowles, Krichna, MD  insulin glargine (LANTUS) 100 UNIT/ML injection Inject 0.1 mLs (10 Units total) into the skin daily. 07/23/18 08/22/18  Alba CorySowles, Krichna, MD  lacosamide 100 MG TABS Take 1 tablet (100 mg total) by mouth 2 (two) times daily. Patient not taking: Reported on 09/01/2018 12/08/17   Enedina FinnerPatel, Sona, MD  promethazine (PHENERGAN) 12.5 MG tablet Take 1 tablet (12.5 mg total) by mouth every 6 (six) hours as needed for nausea or vomiting. Patient not taking: Reported on 09/01/2018 09/26/17   Almond LintByerly, Faera, MD  sertraline (ZOLOFT) 25 MG tablet Take 1 tablet (25 mg total) by mouth daily. Patient not taking: Reported on 09/01/2018 07/23/18   Alba Cory, MD   Allergies No Known Allergies  Family History Family History  Problem Relation Age of Onset  . Cancer Other   . Diabetes Other   . Hypertension Other   . Cancer Paternal Grandfather    Social History  reports that she has quit smoking. She has never used smokeless tobacco. She reports that she does not drink alcohol or use drugs.  Review Of Systems:    Constitutional: Negative for fever and chills.  HENT: Negative for congestion and rhinorrhea.  Eyes: Negative for redness and visual disturbance.  Respiratory: Negative for shortness of breath and wheezing.  Cardiovascular: Negative for chest pain and palpitations.  Gastrointestinal: Positive  for nausea , vomiting and abdominal pain but negative  loose stools Genitourinary: Negative for dysuria and urgency.  Endocrine: Denies polyuria, polyphagia and heat intolerance Musculoskeletal: positive for back pain Skin: Positive for rash Neurological: Negative for dizziness and headaches   VITAL SIGNS: BP (!) 147/86   Pulse 99   Temp 98.5 F (36.9 C) (Oral)   Resp 17   Ht 5\' 4"  (1.626 m)   Wt 95.3 kg   LMP 03/05/2019 (Approximate)   SpO2 95%   BMI 36.05 kg/m   HEMODYNAMICS:    VENTILATOR SETTINGS:    INTAKE / OUTPUT: No intake/output data recorded.  PHYSICAL EXAMINATION: General: NAD HEENT:  PERRLA Neuro:  AAO X4, moves all extremities Cardiovascular:  RRR, S1/S2, no edema Lungs:  CTAB Abdomen: Obese, RUQ tenderness, +BS X 4 Musculoskeletal:  +ROM, no deformities Skin: warm and dry  LABS:  BMET Recent Labs  Lab 03/16/19 2340  NA 137  K 3.9  CL 106  CO2 18*  BUN 14  CREATININE 0.38*  GLUCOSE 199*    Electrolytes Recent Labs  Lab 03/16/19 2340  CALCIUM 8.5*    CBC Recent Labs  Lab 03/16/19 2145  WBC 9.8  HGB 14.3  HCT 40.0  PLT 368    Coag's No results for input(s): APTT, INR in the last 168 hours.  Sepsis Markers No results for input(s): LATICACIDVEN, PROCALCITON, O2SATVEN in the last 168 hours.  ABG No results for input(s): PHART, PCO2ART, PO2ART in the last 168 hours.  Liver Enzymes Recent Labs  Lab 03/16/19 2340  AST 19  ALT 14  ALKPHOS 57  BILITOT 0.5  ALBUMIN 3.4*    Cardiac Enzymes No results for input(s): TROPONINI, PROBNP in the last 168 hours.  Glucose Recent Labs  Lab 03/17/19 0307 03/17/19 0403  03/17/19 0453  GLUCAP 143* 172* 135*    Imaging No results found.   STUDIES:  none  CULTURES: none  ANTIBIOTICS: none  SIGNIFICANT EVENTS: 03/17/2019: admitted  LINES/TUBES: PIVs  ASSESSMENT  Acute pancreatitis Severe hypertriglyceridemia Uncontrolled hypertension Type 2 DM  PLAN Insulin gtt per protocol Clear liquid diet HgA1C with am labs Antiemetics IV fluids Pain management with morphine  Best Practice: Code Status: full code Diet: CLEAR LIQUIDS GI prophylaxis: not indicated VTE prophylaxis:  SCDs and SQ heparin  FAMILY  - Updates: No family at bedside per COVID prevention protocol. Family updated by admitting team    S. Tukov-Yual ANP-BC Pulmonary and Critical Care Medicine [facility] Pager [phone] or [phone]  NB: This document was prepared using Dragon voice recognition software and may include unintentional dictation errors.    03/17/2019, 5:21 AM

## 2019-03-17 NOTE — Progress Notes (Signed)
Patient admitted this morning by Dr. Sheryle Hail.  Agree with current plan. Other management by intensivist.

## 2019-03-17 NOTE — ED Notes (Signed)
ED TO INPATIENT HANDOFF REPORT  ED Nurse Name and Phone #: Woody Seller Name/Age/Gender [name] 38 y.o. female Room/Bed: ED17A/ED17A  Code Status   Code Status: Prior  Home/SNF/Other Home Patient oriented to: self, place, time and situation Is this baseline? Yes   Triage Complete: Triage complete  Chief Complaint Abd  Back Pain  Triage Note Patient reports having abdominal pain and back pain for couple weeks.  Reports history of pancreatitis and feels like this is the same.   Allergies No Known Allergies  Level of Care/Admitting Diagnosis ED Disposition    ED Disposition Condition Comment   Admit  Hospital Area: [facility] [[id number]]  Level of Care: Stepdown [14]  Covid Evaluation: N/A  Diagnosis: Pancreatitis [202663]  Admitting Physician: Arnaldo Natal [[id number]]  Attending Physician: Arnaldo Natal [[id number]]  Estimated length of stay: past midnight tomorrow  Certification:: I certify this patient will need inpatient services for at least 2 midnights  PT Class (Do Not Modify): Inpatient [101]  PT Acc Code (Do Not Modify): Private [1]       B Medical/Surgery History Past Medical History:  Diagnosis Date  . Diabetes mellitus   . Hypercholesteremia   . Hypertension   . Pancreatitis    Past Surgical History:  Procedure Laterality Date  . KNEE SURGERY       A IV Location/Drains/Wounds Patient Lines/Drains/Airways Status   Active Line/Drains/Airways    Name:   Placement date:   Placement time:   Site:   Days:   Peripheral IV 03/16/19 Left Hand   03/16/19    2325    Hand   1          Intake/Output Last 24 hours No intake or output data in the 24 hours ending 03/17/19 0318  Labs/Imaging Results for orders placed or performed during the hospital encounter of 03/16/19 (from the past 48 hour(s))  CBC     Status: Abnormal   Collection Time: 03/16/19  9:45 PM  Result Value Ref Range   WBC 9.8 4.0 - 10.5 K/uL   RBC 4.74  3.87 - 5.11 MIL/uL   Hemoglobin 14.3 12.0 - 15.0 g/dL   HCT 19.1 47.8 - 29.5 %   MCV 84.2 80.0 - 100.0 fL   MCH 34.8 (H) 26.0 - 34.0 pg   MCHC 35.8 30.0 - 36.0 g/dL    Comment: CORRECTED FOR LIPEMIA   RDW 13.3 11.5 - 15.5 %   Platelets 368 150 - 400 K/uL   nRBC 0.0 0.0 - 0.2 %    Comment: Performed at [facility], [address] [place], [place] [address]  Urinalysis, Complete w Microscopic     Status: Abnormal   Collection Time: 03/16/19  9:45 PM  Result Value Ref Range   Color, Urine YELLOW (A) YELLOW   APPearance CLEAR (A) CLEAR   Specific Gravity, Urine 1.029 1.005 - 1.030   pH 5.0 5.0 - 8.0   Glucose, UA >=500 (A) NEGATIVE mg/dL   Hgb urine dipstick NEGATIVE NEGATIVE   Bilirubin Urine NEGATIVE NEGATIVE   Ketones, ur 5 (A) NEGATIVE mg/dL   Protein, ur 865 (A) NEGATIVE mg/dL   Nitrite NEGATIVE NEGATIVE   Leukocytes,Ua NEGATIVE NEGATIVE   RBC / HPF 0-5 0 - 5 RBC/hpf   WBC, UA 6-10 0 - 5 WBC/hpf   Bacteria, UA FEW (A) NONE SEEN   Squamous Epithelial / LPF 0-5 0 - 5   Mucus PRESENT     Comment:  Performed at [facility], [address] [place], [place] [address]  Pregnancy, urine POC     Status: None   Collection Time: 03/16/19  9:53 PM  Result Value Ref Range   Preg Test, Ur NEGATIVE NEGATIVE    Comment:        THE SENSITIVITY OF THIS METHODOLOGY IS >24 mIU/mL   Comprehensive metabolic panel     Status: Abnormal   Collection Time: 03/16/19 11:40 PM  Result Value Ref Range   Sodium 137 135 - 145 mmol/L   Potassium 3.9 3.5 - 5.1 mmol/L   Chloride 106 98 - 111 mmol/L   CO2 18 (L) 22 - 32 mmol/L   Glucose, Bld 199 (H) 70 - 99 mg/dL   BUN 14 6 - 20 mg/dL   Creatinine, Ser 2.29 (L) 0.44 - 1.00 mg/dL   Calcium 8.5 (L) 8.9 - 10.3 mg/dL   Total Protein 6.8 6.5 - 8.1 g/dL   Albumin 3.4 (L) 3.5 - 5.0 g/dL   AST 19 15 - 41 U/L   ALT 14 0 - 44 U/L   Alkaline Phosphatase 57 38 - 126 U/L   Total Bilirubin 0.5 0.3 - 1.2 mg/dL   GFR calc non Af Amer >60  >60 mL/min   GFR calc Af Amer >60 >60 mL/min   Anion gap 13 5 - 15    Comment: Performed at [facility], [address] [place], [place] [address]  Lipase, blood     Status: None   Collection Time: 03/16/19 11:40 PM  Result Value Ref Range   Lipase 19 11 - 51 U/L    Comment: Performed at [facility], [address] [place], [place] [address]  Lipid panel     Status: Abnormal   Collection Time: 03/16/19 11:40 PM  Result Value Ref Range   Cholesterol 688 (H) 0 - 200 mg/dL    Comment: RESULT CONFIRMED BY MANUAL DILUTION RDW   Triglycerides >5,000 (H) <150 mg/dL    Comment: CORRECTED ON 04/19 AT 0311: PREVIOUSLY REPORTED AS 895 RESULT CONFIRMED BY MANUAL DILUTION RDW   HDL 26 (L) >40 mg/dL   Total CHOL/HDL Ratio NOT REPORTED DUE TO HIGH TRIGLYCERIDES RATIO    Comment: CORRECTED ON 04/19 AT 7408: PREVIOUSLY REPORTED AS 26.5   VLDL UNABLE TO CALCULATE IF TRIGLYCERIDE OVER 400 mg/dL 0 - 40 mg/dL   LDL Cholesterol NOT CALCULATED 0 - 99 mg/dL    Comment: Performed at [facility], [address] [place], [place] [address]  Glucose, capillary     Status: Abnormal   Collection Time: 03/17/19  3:07 AM  Result Value Ref Range   Glucose-Capillary 143 (H) 70 - 99 mg/dL   No results found.  Pending Labs Unresulted Labs (From admission, onward)    Start     Ordered   03/17/19 0317  MRSA PCR Screening  Once,   STAT     03/17/19 0316   Signed and Held  TSH  Add-on,   R     Signed and Held          Vitals/Pain Today's Vitals   03/17/19 0130 03/17/19 0200 03/17/19 0205 03/17/19 0230  BP: (!) 160/96 139/85  (!) 159/116  Pulse: (!) 102 (!) 102  (!) 105  Resp:  16    Temp:      TempSrc:      SpO2: 97% 95%  95%  Weight:      Height:  PainSc:   2      Isolation Precautions No active isolations  Medications Medications  metoprolol tartrate (LOPRESSOR) injection 2.5 mg (2.5 mg Intravenous Not Given 03/17/19 0310)  sodium chloride 0.9 % bolus  1,000 mL (0 mLs Intravenous Stopped 03/17/19 0146)  ondansetron (ZOFRAN) injection 4 mg (4 mg Intravenous Given 03/16/19 2339)  morphine 4 MG/ML injection 4 mg (4 mg Intravenous Given 03/16/19 2339)    Mobility walks Low fall risk   Focused Assessments Gastrointestinal   R Recommendations: See Admitting Provider Note  Report given to:   Additional Notes:

## 2019-03-18 LAB — BASIC METABOLIC PANEL
Anion gap: 13 (ref 5–15)
BUN: 6 mg/dL (ref 6–20)
CO2: 21 mmol/L — ABNORMAL LOW (ref 22–32)
Calcium: 8.7 mg/dL — ABNORMAL LOW (ref 8.9–10.3)
Chloride: 104 mmol/L (ref 98–111)
Creatinine, Ser: 0.36 mg/dL — ABNORMAL LOW (ref 0.44–1.00)
GFR calc Af Amer: >60 mL/min (ref >60)
GFR calc non Af Amer: >60 mL/min (ref >60)
Glucose, Bld: 211 mg/dL — ABNORMAL HIGH (ref 70–99)
Potassium: 4 mmol/L (ref 3.5–5.1)
Sodium: 138 mmol/L (ref 135–145)

## 2019-03-18 LAB — GLUCOSE, CAPILLARY
Glucose-Capillary: 229 mg/dL — ABNORMAL HIGH (ref 70–99)
Glucose-Capillary: 74 mg/dL (ref 70–99)
Glucose-Capillary: 82 mg/dL (ref 70–99)
Glucose-Capillary: 92 mg/dL (ref 70–99)
Glucose-Capillary: 100 mg/dL — ABNORMAL HIGH (ref 70–99)
Glucose-Capillary: 100 mg/dL — ABNORMAL HIGH (ref 70–99)
Glucose-Capillary: 91 mg/dL (ref 70–99)
Glucose-Capillary: 90 mg/dL (ref 70–99)
Glucose-Capillary: 156 mg/dL — ABNORMAL HIGH (ref 70–99)
Glucose-Capillary: 180 mg/dL — ABNORMAL HIGH (ref 70–99)
Glucose-Capillary: 161 mg/dL — ABNORMAL HIGH (ref 70–99)
Glucose-Capillary: 171 mg/dL — ABNORMAL HIGH (ref 70–99)
Glucose-Capillary: 211 mg/dL — ABNORMAL HIGH (ref 70–99)
Glucose-Capillary: 207 mg/dL — ABNORMAL HIGH (ref 70–99)

## 2019-03-18 LAB — COMPREHENSIVE METABOLIC PANEL
ALT: 15 U/L (ref 0–44)
AST: 22 U/L (ref 15–41)
Albumin: 3.1 g/dL — ABNORMAL LOW (ref 3.5–5.0)
Alkaline Phosphatase: 36 U/L — ABNORMAL LOW (ref 38–126)
Anion gap: 13 (ref 5–15)
BUN: <5 mg/dL — ABNORMAL LOW (ref 6–20)
CO2: 22 mmol/L (ref 22–32)
Calcium: 8.1 mg/dL — ABNORMAL LOW (ref 8.9–10.3)
Chloride: 103 mmol/L (ref 98–111)
Creatinine, Ser: <0.3 mg/dL — ABNORMAL LOW (ref 0.44–1.00)
Glucose, Bld: 89 mg/dL (ref 70–99)
Potassium: 3.2 mmol/L — ABNORMAL LOW (ref 3.5–5.1)
Sodium: 138 mmol/L (ref 135–145)
Total Bilirubin: 0.1 mg/dL — ABNORMAL LOW (ref 0.3–1.2)
Total Protein: 6.4 g/dL — ABNORMAL LOW (ref 6.5–8.1)

## 2019-03-18 LAB — PHOSPHORUS: Phosphorus: 3.2 mg/dL (ref 2.5–4.6)

## 2019-03-18 LAB — TRIGLYCERIDES: Triglycerides: 3911 mg/dL — ABNORMAL HIGH (ref <150)

## 2019-03-18 LAB — MAGNESIUM: Magnesium: 2.2 mg/dL (ref 1.7–2.4)

## 2019-03-18 MED ORDER — FAMOTIDINE 20 MG PO TABS
20.0000 mg | ORAL_TABLET | Freq: Two times a day (BID) | ORAL | Status: DC
  Administered 2019-03-18 – 2019-03-21 (×7): 20 mg via ORAL
  Filled 2019-03-18 (×7): qty 1

## 2019-03-18 MED ORDER — POTASSIUM CHLORIDE 2 MEQ/ML IV SOLN
INTRAVENOUS | Status: DC
  Administered 2019-03-18: 08:00:00 via INTRAVENOUS
  Filled 2019-03-18 (×3): qty 1000

## 2019-03-18 MED ORDER — POTASSIUM CHLORIDE 10 MEQ/100ML IV SOLN
10.0000 meq | INTRAVENOUS | Status: AC
  Administered 2019-03-18 (×2): 10 meq via INTRAVENOUS
  Filled 2019-03-18 (×2): qty 100

## 2019-03-18 MED ORDER — ENOXAPARIN SODIUM 40 MG/0.4ML ~~LOC~~ SOLN
40.0000 mg | SUBCUTANEOUS | Status: DC
  Administered 2019-03-18 – 2019-03-20 (×2): 40 mg via SUBCUTANEOUS
  Filled 2019-03-18 (×2): qty 0.4

## 2019-03-18 MED ORDER — POTASSIUM CHLORIDE 2 MEQ/ML IV SOLN
INTRAVENOUS | Status: DC
  Administered 2019-03-18 – 2019-03-19 (×2): via INTRAVENOUS
  Filled 2019-03-18 (×4): qty 1000

## 2019-03-18 MED ORDER — DEXTROSE 10 % IV SOLN
INTRAVENOUS | Status: DC
  Administered 2019-03-18: 06:00:00 via INTRAVENOUS

## 2019-03-18 NOTE — Progress Notes (Signed)
Pharmacy Electrolyte Monitoring Consult:  Pharmacy consulted to assist in monitoring and replacing electrolytes in this 38 y.o. female admitted on 03/16/2019 with hypertriglyceridemia requiring insulin infusion. Insulin currently infusing at 9.5 units/hr.   Labs:  Sodium (mmol/L)  Date Value  03/18/2019 138  02/20/2015 138   Potassium (mmol/L)  Date Value  03/18/2019 3.2 (L)  02/20/2015 3.7   Magnesium (mg/dL)  Date Value  29/79/8921 2.2  09/14/2013 1.8   Phosphorus (mg/dL)  Date Value  19/41/7408 3.2   Calcium (mg/dL)  Date Value  14/48/1856 8.1 (L)   Calcium, Total (mg/dL)  Date Value  31/49/7026 8.3 (L)   Albumin (g/dL)  Date Value  37/85/8850 3.1 (L)  10/11/2014 2.2 (L)    Assessment/Plan: Will order potassium IV Q1hr x 2 doses. Will transition D10 to D10/40K at 7mL/hr.   Will recheck BMP at 1500.   Will replace for goal potassium ~ 4 while on insulin infusion.   Pharmacy will continue to monitor and adjust per consult.   Christifer Chapdelaine L 03/18/2019 1:42 PM

## 2019-03-18 NOTE — Progress Notes (Signed)
[facility] - [place] [facility]   PATIENT NAME: Anges Carrigan    MR#:  [id number]  DATE OF BIRTH:  05-Dec-1980  SUBJECTIVE:  CHIEF COMPLAINT: Patient is resting comfortably.  Denies any abdominal pain  REVIEW OF SYSTEMS:  CONSTITUTIONAL: No fever, fatigue or weakness.  EYES: No blurred or double vision.  EARS, NOSE, AND THROAT: No tinnitus or ear pain.  RESPIRATORY: No cough, shortness of breath, wheezing or hemoptysis.  CARDIOVASCULAR: No chest pain, orthopnea, edema.  GASTROINTESTINAL: No nausea, vomiting, diarrhea or abdominal pain.  GENITOURINARY: No dysuria, hematuria.  ENDOCRINE: No polyuria, nocturia,  HEMATOLOGY: No anemia, easy bruising or bleeding SKIN: No rash or lesion. MUSCULOSKELETAL: No joint pain or arthritis.   NEUROLOGIC: No tingling, numbness, weakness.  PSYCHIATRY: No anxiety or depression.   DRUG ALLERGIES:  No Known Allergies  VITALS:  Blood pressure (!) 107/53, pulse (!) 105, temperature 98.4 F (36.9 C), temperature source Oral, resp. rate 19, height 5\' 4"  (1.626 m), weight 96.3 kg, last menstrual period 03/05/2019, SpO2 97 %.  PHYSICAL EXAMINATION:  GENERAL:  38 y.o.-year-old patient lying in the bed with no acute distress.  EYES: Pupils equal, round, reactive to light and accommodation. No scleral icterus. Extraocular muscles intact.  HEENT: Head atraumatic, normocephalic. Oropharynx and nasopharynx clear.  NECK:  Supple, no jugular venous distention. No thyroid enlargement, no tenderness.  LUNGS: Normal breath sounds bilaterally, no wheezing, rales,rhonchi or crepitation. No use of accessory muscles of respiration.  CARDIOVASCULAR: S1, S2 normal. No murmurs, rubs, or gallops.  ABDOMEN: Soft, nontender, nondistended. Bowel sounds present.  EXTREMITIES: No pedal edema, cyanosis, or clubbing.  NEUROLOGIC: Awake, alert and oriented x3. Sensation intact. Gait not checked.  PSYCHIATRIC: The patient is alert and oriented x 3.   SKIN: No obvious rash, lesion, or ulcer.    LABORATORY PANEL:   CBC Recent Labs  Lab 03/16/19 2145  WBC 9.8  HGB 14.3  HCT 40.0  PLT 368   ------------------------------------------------------------------------------------------------------------------  Chemistries  Recent Labs  Lab 03/18/19 0458  NA 138  K 3.2*  CL 103  CO2 22  GLUCOSE 89  BUN <5*  CREATININE <0.30*  CALCIUM 8.1*  MG 2.2  AST 22  ALT 15  ALKPHOS 36*  BILITOT 0.1*   ------------------------------------------------------------------------------------------------------------------  Cardiac Enzymes No results for input(s): TROPONINI in the last 168 hours. ------------------------------------------------------------------------------------------------------------------  RADIOLOGY:  No results found.  EKG:  No orders found for this or any previous visit.  ASSESSMENT AND PLAN:    This is a 38 year old female admitted for pancreatitis. #Acute Pancreatitis: From hypertriglyceridemia  clear liquid diet; morphine as needed for severe pain.   The patient has persistent tachycardia but no indication of sepsis.    # Hypertriglyceridemia: Triglycerides are 3900 today Continue insulin drip   #.  Diabetes mellitus type 2: Once patient is off insulin drip adjust diabetic regimen and restart sliding scale  4.  Hypokalemia replete and recheck #.  Obesity: BMI is 36; encourage healthy diet and exercise    DVT prophylaxis: Lovenox  GI prophylaxis: PPI as needed    All the records are reviewed and case discussed with Care Management/Social Workerr. Management plans discussed with the patient, she is in agreement.  CODE STATUS: fc   TOTAL TIME TAKING CARE OF THIS PATIENT: 35  minutes.   POSSIBLE D/C IN 2-3  DAYS, DEPENDING ON CLINICAL CONDITION.  Note: This dictation was prepared with Dragon dictation along with smaller phrase technology. Any transcriptional errors that result from  this process  are unintentional.   Ramonita LabAruna Donne Robillard M.D on 03/18/2019 at 12:57 PM  Between 7am to 6pm - Pager - [phone] After 6pm go to [email or web address] - password [email or web address]  [facility]  Office  [phone]  CC: Primary care physician; Patient, No Pcp Per

## 2019-03-18 NOTE — Progress Notes (Signed)
Pharmacy Electrolyte Monitoring Consult:  Pharmacy consulted to assist in monitoring and replacing electrolytes in this 38 y.o. female admitted on 03/16/2019 with hypertriglyceridemia requiring insulin infusion. Insulin currently infusing at 9.5 units/hr.   Labs:  Sodium (mmol/L)  Date Value  03/18/2019 138  02/20/2015 138   Potassium (mmol/L)  Date Value  03/18/2019 4.0  02/20/2015 3.7   Magnesium (mg/dL)  Date Value  92/92/4462 2.2  09/14/2013 1.8   Phosphorus (mg/dL)  Date Value  86/38/1771 3.2   Calcium (mg/dL)  Date Value  16/57/9038 8.7 (L)   Calcium, Total (mg/dL)  Date Value  33/38/3291 8.3 (L)   Albumin (g/dL)  Date Value  91/66/0600 3.1 (L)  10/11/2014 2.2 (L)    Assessment/Plan: Patient received potassium IV Q1hr x 2 doses. Will transition D10/40K to D10/20K at 36mL/hr.   Will recheck electrolytes with am labs.   Will replace for goal potassium ~ 4 while on insulin infusion.   Pharmacy will continue to monitor and adjust per consult.   Eshika Reckart L 03/18/2019 4:40 PM

## 2019-03-19 LAB — CBC WITH DIFFERENTIAL/PLATELET
Abs Immature Granulocytes: 0.05 10*3/uL (ref 0.00–0.07)
Basophils Absolute: 0 10*3/uL (ref 0.0–0.1)
Basophils Relative: 1 %
Eosinophils Absolute: 0.2 10*3/uL (ref 0.0–0.5)
Eosinophils Relative: 3 %
HCT: 38.1 % (ref 36.0–46.0)
Hemoglobin: 13.7 g/dL (ref 12.0–15.0)
Immature Granulocytes: 1 %
Lymphocytes Relative: 43 %
Lymphs Abs: 3 10*3/uL (ref 0.7–4.0)
MCH: 31.1 pg (ref 26.0–34.0)
MCHC: 36 g/dL (ref 30.0–36.0)
MCV: 86.4 fL (ref 80.0–100.0)
Monocytes Absolute: 0.6 10*3/uL (ref 0.1–1.0)
Monocytes Relative: 9 %
Neutro Abs: 2.9 10*3/uL (ref 1.7–7.7)
Neutrophils Relative %: 43 %
Platelets: 256 10*3/uL (ref 150–400)
RBC: 4.41 MIL/uL (ref 3.87–5.11)
RDW: 13.5 % (ref 11.5–15.5)
WBC: 6.7 10*3/uL (ref 4.0–10.5)
nRBC: 0 % (ref 0.0–0.2)

## 2019-03-19 LAB — BASIC METABOLIC PANEL
Anion gap: 17 — ABNORMAL HIGH (ref 5–15)
BUN: 10 mg/dL (ref 6–20)
CO2: 17 mmol/L — ABNORMAL LOW (ref 22–32)
Calcium: 9.1 mg/dL (ref 8.9–10.3)
Chloride: 101 mmol/L (ref 98–111)
Creatinine, Ser: 0.55 mg/dL (ref 0.44–1.00)
GFR calc Af Amer: >60 mL/min (ref >60)
GFR calc non Af Amer: >60 mL/min (ref >60)
Glucose, Bld: 228 mg/dL — ABNORMAL HIGH (ref 70–99)
Potassium: 4.4 mmol/L (ref 3.5–5.1)
Sodium: 135 mmol/L (ref 135–145)

## 2019-03-19 LAB — GLUCOSE, CAPILLARY
Glucose-Capillary: 110 mg/dL — ABNORMAL HIGH (ref 70–99)
Glucose-Capillary: 113 mg/dL — ABNORMAL HIGH (ref 70–99)
Glucose-Capillary: 115 mg/dL — ABNORMAL HIGH (ref 70–99)
Glucose-Capillary: 125 mg/dL — ABNORMAL HIGH (ref 70–99)
Glucose-Capillary: 132 mg/dL — ABNORMAL HIGH (ref 70–99)
Glucose-Capillary: 155 mg/dL — ABNORMAL HIGH (ref 70–99)
Glucose-Capillary: 166 mg/dL — ABNORMAL HIGH (ref 70–99)
Glucose-Capillary: 168 mg/dL — ABNORMAL HIGH (ref 70–99)
Glucose-Capillary: 170 mg/dL — ABNORMAL HIGH (ref 70–99)
Glucose-Capillary: 183 mg/dL — ABNORMAL HIGH (ref 70–99)
Glucose-Capillary: 198 mg/dL — ABNORMAL HIGH (ref 70–99)
Glucose-Capillary: 200 mg/dL — ABNORMAL HIGH (ref 70–99)
Glucose-Capillary: 202 mg/dL — ABNORMAL HIGH (ref 70–99)
Glucose-Capillary: 205 mg/dL — ABNORMAL HIGH (ref 70–99)
Glucose-Capillary: 206 mg/dL — ABNORMAL HIGH (ref 70–99)
Glucose-Capillary: 220 mg/dL — ABNORMAL HIGH (ref 70–99)
Glucose-Capillary: 222 mg/dL — ABNORMAL HIGH (ref 70–99)

## 2019-03-19 LAB — COMPREHENSIVE METABOLIC PANEL
ALT: 12 U/L (ref 0–44)
AST: 13 U/L — ABNORMAL LOW (ref 15–41)
Albumin: 3.4 g/dL — ABNORMAL LOW (ref 3.5–5.0)
Alkaline Phosphatase: 41 U/L (ref 38–126)
Anion gap: 17 — ABNORMAL HIGH (ref 5–15)
BUN: 9 mg/dL (ref 6–20)
CO2: 16 mmol/L — ABNORMAL LOW (ref 22–32)
Calcium: 8.8 mg/dL — ABNORMAL LOW (ref 8.9–10.3)
Chloride: 103 mmol/L (ref 98–111)
Creatinine, Ser: 0.35 mg/dL — ABNORMAL LOW (ref 0.44–1.00)
GFR calc Af Amer: >60 mL/min (ref >60)
GFR calc non Af Amer: >60 mL/min (ref >60)
Glucose, Bld: 131 mg/dL — ABNORMAL HIGH (ref 70–99)
Potassium: 3.8 mmol/L (ref 3.5–5.1)
Sodium: 136 mmol/L (ref 135–145)
Total Bilirubin: 0.2 mg/dL — ABNORMAL LOW (ref 0.3–1.2)
Total Protein: 6.8 g/dL (ref 6.5–8.1)

## 2019-03-19 LAB — TRIGLYCERIDES: Triglycerides: 3734 mg/dL — ABNORMAL HIGH (ref <150)

## 2019-03-19 LAB — LIPASE, BLOOD: Lipase: 21 U/L (ref 11–51)

## 2019-03-19 LAB — MAGNESIUM: Magnesium: 1.8 mg/dL (ref 1.7–2.4)

## 2019-03-19 LAB — PHOSPHORUS: Phosphorus: 4.2 mg/dL (ref 2.5–4.6)

## 2019-03-19 MED ORDER — OMEGA-3-ACID ETHYL ESTERS 1 G PO CAPS
1.0000 g | ORAL_CAPSULE | Freq: Two times a day (BID) | ORAL | Status: DC
  Administered 2019-03-19 – 2019-03-21 (×5): 1 g via ORAL
  Filled 2019-03-19 (×6): qty 1

## 2019-03-19 MED ORDER — DEXTROSE 10 % IV SOLN
INTRAVENOUS | Status: DC
  Administered 2019-03-19 – 2019-03-20 (×2): via INTRAVENOUS

## 2019-03-19 MED ORDER — MAGNESIUM SULFATE 2 GM/50ML IV SOLN
2.0000 g | Freq: Once | INTRAVENOUS | Status: AC
  Administered 2019-03-19: 09:00:00 2 g via INTRAVENOUS
  Filled 2019-03-19: qty 50

## 2019-03-19 MED ORDER — INSULIN (MYXREDLIN) INFUSION FOR HYPERTRIGLYCERIDEMIA
1.0000 [IU]/h | INTRAVENOUS | Status: DC
  Administered 2019-03-19: 17:00:00 14 [IU]/h via INTRAVENOUS
  Administered 2019-03-19: 11:00:00 11 [IU]/h via INTRAVENOUS
  Administered 2019-03-20 (×2): 10 [IU]/h via INTRAVENOUS
  Administered 2019-03-20: 14 [IU]/h via INTRAVENOUS
  Administered 2019-03-21: 11 [IU]/h via INTRAVENOUS
  Filled 2019-03-19 (×6): qty 100

## 2019-03-19 MED ORDER — POTASSIUM CHLORIDE 10 MEQ/100ML IV SOLN
10.0000 meq | INTRAVENOUS | Status: AC
  Administered 2019-03-19 (×3): 10 meq via INTRAVENOUS
  Filled 2019-03-19 (×3): qty 100

## 2019-03-19 NOTE — Progress Notes (Signed)
Pharmacy Electrolyte Monitoring Consult:  Pharmacy consulted to assist in monitoring and replacing electrolytes in this 39 y.o. female admitted on 03/16/2019 with hypertriglyceridemia requiring insulin infusion. Insulin currently infusing at 9.5 units/hr.   Labs:  Sodium (mmol/L)  Date Value  03/19/2019 135  02/20/2015 138   Potassium (mmol/L)  Date Value  03/19/2019 4.4  02/20/2015 3.7   Magnesium (mg/dL)  Date Value  29/12/1113 1.8  09/14/2013 1.8   Phosphorus (mg/dL)  Date Value  52/06/222 4.2   Calcium (mg/dL)  Date Value  36/10/2448 9.1   Calcium, Total (mg/dL)  Date Value  75/30/0511 8.3 (L)   Albumin (g/dL)  Date Value  01/08/1734 3.4 (L)  10/11/2014 2.2 (L)    Assessment/Plan: Patient remains on insulin infusion. Continue D10 at 134mL/hr.   Will recheck electrolytes with am labs.   Will replace for goal potassium ~ 4 while on insulin infusion.   Pharmacy will continue to monitor and adjust per consult.   Lotta Frankenfield L 03/19/2019 5:29 PM

## 2019-03-19 NOTE — Progress Notes (Signed)
Spoke with pt regarding wanting to leave AMA.   Explained insurance will not pay for hospital stay if pt leaves AMA.   Explained next triglyceride level to be drawn around 0500. Pt stated will stay another night and wait for triglceride level in the morning.

## 2019-03-19 NOTE — Progress Notes (Signed)
Informed Pharmacist CBG 170.  Per Pharmacist, keep insulin gtt and IV fluids at same rate.

## 2019-03-19 NOTE — Progress Notes (Signed)
Informed pharmacist CBG 220.  Per pharmacist, increased insulin gtt to 13 ml/hr.

## 2019-03-19 NOTE — Progress Notes (Signed)
Informed pharmacist CBG 202.  Per Pharmacist, Insulin gtt to remain at 14 ml./hr.  Pharmacist to put in parameters.

## 2019-03-19 NOTE — Progress Notes (Signed)
Informed pharmacist CBG 198.  Per pharmacist, increased insulin gtt to 14 ml/hr.

## 2019-03-19 NOTE — Progress Notes (Signed)
[facility] [place] [facility]   PATIENT NAME: [name] CadetBrandy Nguyen    MR#:  [id number]  DATE OF BIRTH:  06/19/1981  SUBJECTIVE:  CHIEF COMPLAINT: Patient is resting comfortably.  Denies any abdominal pain Denies any nausea vomiting tolerating soft diet.   REVIEW OF SYSTEMS:  CONSTITUTIONAL: No fever, fatigue or weakness.  EYES: No blurred or double vision.  EARS, NOSE, AND THROAT: No tinnitus or ear pain.  RESPIRATORY: No cough, shortness of breath, wheezing or hemoptysis.  CARDIOVASCULAR: No chest pain, orthopnea, edema.  GASTROINTESTINAL: No nausea, vomiting, diarrhea or abdominal pain.  GENITOURINARY: No dysuria, hematuria.  ENDOCRINE: No polyuria, nocturia,  HEMATOLOGY: No anemia, easy bruising or bleeding SKIN: No rash or lesion. MUSCULOSKELETAL: No joint pain or arthritis.   NEUROLOGIC: No tingling, numbness, weakness.  PSYCHIATRY: No anxiety or depression.   DRUG ALLERGIES:  No Known Allergies  VITALS:  Blood pressure (!) 125/94, pulse 99, temperature 99.2 F (37.3 C), temperature source Oral, resp. rate 20, height 5\' 4"  (1.626 m), weight 96.3 kg, last menstrual period 03/05/2019, SpO2 95 %.  PHYSICAL EXAMINATION:  GENERAL:  38 y.o.-year-old patient lying in the bed with no acute distress.  EYES: Pupils equal, round, reactive to light and accommodation. No scleral icterus. Extraocular muscles intact.  HEENT: Head atraumatic, normocephalic. Oropharynx and nasopharynx clear.  NECK:  Supple, no jugular venous distention. No thyroid enlargement, no tenderness.  LUNGS: Normal breath sounds bilaterally, no wheezing, rales,rhonchi or crepitation. No use of accessory muscles of respiration.  CARDIOVASCULAR: S1, S2 normal. No murmurs, rubs, or gallops.  ABDOMEN: Soft, nontender, nondistended. Bowel sounds present.  EXTREMITIES: No pedal edema, cyanosis, or clubbing.  NEUROLOGIC: Awake, alert and oriented x3. Sensation intact. Gait not checked.   PSYCHIATRIC: The patient is alert and oriented x 3.  SKIN: No obvious rash, lesion, or ulcer.    LABORATORY PANEL:   CBC Recent Labs  Lab 03/19/19 0408  WBC 6.7  HGB 13.7  HCT 38.1  PLT 256   ------------------------------------------------------------------------------------------------------------------  Chemistries  Recent Labs  Lab 03/19/19 0408  NA 136  K 3.8  CL 103  CO2 16*  GLUCOSE 131*  BUN 9  CREATININE 0.35*  CALCIUM 8.8*  MG 1.8  AST 13*  ALT 12  ALKPHOS 41  BILITOT 0.2*   ------------------------------------------------------------------------------------------------------------------  Cardiac Enzymes No results for input(s): TROPONINI in the last 168 hours. ------------------------------------------------------------------------------------------------------------------  RADIOLOGY:  No results found.  EKG:  No orders found for this or any previous visit.  ASSESSMENT AND PLAN:    This is a 38 year old female admitted for pancreatitis. #Acute Pancreatitis: From hypertriglyceridemia  Tolerating soft diet; morphine as needed for severe pain.   The patient has persistent tachycardia but no indication of sepsis.    # Hypertriglyceridemia: Triglycerides are 3900 --3700 today Continue insulin drip with a D5  #.  Diabetes mellitus type 2: Once patient is off insulin drip adjust diabetic regimen and restart sliding scale  4.  Hypokalemia repleted and recheck potassium 3.8  #.  Obesity: BMI is 36; encourage healthy diet and exercise    DVT prophylaxis: Lovenox  GI prophylaxis: PPI as needed    All the records are reviewed and case discussed with Care Management/Social Workerr. Management plans discussed with the patient, she is in agreement.  CODE STATUS: fc   TOTAL TIME TAKING CARE OF THIS PATIENT: 33  minutes.   POSSIBLE D/C IN 2-  DAYS, DEPENDING ON CLINICAL CONDITION.  Note: This dictation was prepared with  Dragon dictation along  with smaller Lobbyist. Any transcriptional errors that result from this process are unintentional.   Ramonita Lab M.D on 03/19/2019 at 1:12 PM  Between 7am to 6pm - Pager - [phone] After 6pm go to [email or web address] - password [email or web address]  [facility]  Office  [phone]  CC: Primary care physician; Patient, No Pcp Per

## 2019-03-19 NOTE — Progress Notes (Signed)
PATIENT WANTS TO GO HOME!  SHE WAS ADVISED THAT IF IS SHE WANTS TO GO HOME,  IT WILL BE AGAINST MEDICAL ADVISE.  WILL GIVE PATIENT AMA PAPERS IF SHE WANTS THEM.

## 2019-03-19 NOTE — Progress Notes (Signed)
Pt agreed to q1 hr CBG check to decrease triglyceride level more quickly.  Pharmacist adjusting D10 and insulin drip rates per CBG.

## 2019-03-20 LAB — BASIC METABOLIC PANEL
Anion gap: 13 (ref 5–15)
Anion gap: 11 (ref 5–15)
BUN: 11 mg/dL (ref 6–20)
BUN: 11 mg/dL (ref 6–20)
CO2: 20 mmol/L — ABNORMAL LOW (ref 22–32)
CO2: 21 mmol/L — ABNORMAL LOW (ref 22–32)
Calcium: 9.1 mg/dL (ref 8.9–10.3)
Calcium: 9.5 mg/dL (ref 8.9–10.3)
Chloride: 102 mmol/L (ref 98–111)
Chloride: 100 mmol/L (ref 98–111)
Creatinine, Ser: 0.33 mg/dL — ABNORMAL LOW (ref 0.44–1.00)
Creatinine, Ser: 0.79 mg/dL (ref 0.44–1.00)
GFR calc Af Amer: >60 mL/min (ref >60)
GFR calc Af Amer: >60 mL/min (ref >60)
GFR calc non Af Amer: >60 mL/min (ref >60)
GFR calc non Af Amer: >60 mL/min (ref >60)
Glucose, Bld: 143 mg/dL — ABNORMAL HIGH (ref 70–99)
Glucose, Bld: 214 mg/dL — ABNORMAL HIGH (ref 70–99)
Potassium: 3.5 mmol/L (ref 3.5–5.1)
Potassium: 4.4 mmol/L (ref 3.5–5.1)
Sodium: 135 mmol/L (ref 135–145)
Sodium: 132 mmol/L — ABNORMAL LOW (ref 135–145)

## 2019-03-20 LAB — GLUCOSE, CAPILLARY
Glucose-Capillary: 126 mg/dL — ABNORMAL HIGH (ref 70–99)
Glucose-Capillary: 128 mg/dL — ABNORMAL HIGH (ref 70–99)
Glucose-Capillary: 130 mg/dL — ABNORMAL HIGH (ref 70–99)
Glucose-Capillary: 132 mg/dL — ABNORMAL HIGH (ref 70–99)
Glucose-Capillary: 172 mg/dL — ABNORMAL HIGH (ref 70–99)
Glucose-Capillary: 187 mg/dL — ABNORMAL HIGH (ref 70–99)
Glucose-Capillary: 206 mg/dL — ABNORMAL HIGH (ref 70–99)
Glucose-Capillary: 215 mg/dL — ABNORMAL HIGH (ref 70–99)
Glucose-Capillary: 219 mg/dL — ABNORMAL HIGH (ref 70–99)
Glucose-Capillary: 241 mg/dL — ABNORMAL HIGH (ref 70–99)
Glucose-Capillary: 241 mg/dL — ABNORMAL HIGH (ref 70–99)
Glucose-Capillary: 273 mg/dL — ABNORMAL HIGH (ref 70–99)

## 2019-03-20 LAB — TRIGLYCERIDES: Triglycerides: 2382 mg/dL — ABNORMAL HIGH (ref <150)

## 2019-03-20 LAB — MAGNESIUM: Magnesium: 1.8 mg/dL (ref 1.7–2.4)

## 2019-03-20 MED ORDER — MAGNESIUM SULFATE 2 GM/50ML IV SOLN
2.0000 g | Freq: Once | INTRAVENOUS | Status: AC
  Administered 2019-03-20: 09:00:00 2 g via INTRAVENOUS
  Filled 2019-03-20: qty 50

## 2019-03-20 MED ORDER — POTASSIUM CHLORIDE CRYS ER 20 MEQ PO TBCR
40.0000 meq | EXTENDED_RELEASE_TABLET | Freq: Once | ORAL | Status: AC
  Administered 2019-03-20: 09:00:00 40 meq via ORAL
  Filled 2019-03-20: qty 2

## 2019-03-20 MED ORDER — SODIUM CHLORIDE 4 MEQ/ML IV SOLN
INTRAVENOUS | Status: DC
  Administered 2019-03-20 – 2019-03-21 (×3): via INTRAVENOUS
  Filled 2019-03-20 (×5): qty 1000

## 2019-03-20 NOTE — Progress Notes (Signed)
Pharmacy Electrolyte Monitoring Consult:  Pharmacy consulted to assist in monitoring and replacing electrolytes in this 38 y.o. female admitted on 03/16/2019 with hypertriglyceridemia requiring insulin infusion. Insulin currently infusing at 9.5 units/hr.   Labs:  Sodium (mmol/L)  Date Value  03/20/2019 132 (L)  02/20/2015 138   Potassium (mmol/L)  Date Value  03/20/2019 4.4  02/20/2015 3.7   Magnesium (mg/dL)  Date Value  92/09/9416 1.8  09/14/2013 1.8   Phosphorus (mg/dL)  Date Value  40/81/4481 4.2   Calcium (mg/dL)  Date Value  85/63/1497 9.5   Calcium, Total (mg/dL)  Date Value  02/63/7858 8.3 (L)   Albumin (g/dL)  Date Value  85/12/7739 3.4 (L)  10/11/2014 2.2 (L)    Assessment/Plan: Patient remains on insulin infusion.   Potassium PO x 1. Continue D10/NS at 128mL/hr.   Will recheck electrolytes with am labs.   Will replace for goal potassium ~ 4 while on insulin infusion.   Pharmacy will continue to monitor and adjust per consult.   Simpson,Michael L 03/20/2019 3:48 PM

## 2019-03-20 NOTE — Progress Notes (Signed)
[facility] [place] at [facility]   PATIENT NAME: Jacqulynn CadetBrandy Estock    MR#:  [id number]  DATE OF BIRTH:  11/06/1981  SUBJECTIVE:  CHIEF COMPLAINT: Patient is resting comfortably.  No overnight incidents denies any abdominal pain Denies any nausea vomiting tolerating soft diet.   REVIEW OF SYSTEMS:  CONSTITUTIONAL: No fever, fatigue or weakness.  EYES: No blurred or double vision.  EARS, NOSE, AND THROAT: No tinnitus or ear pain.  RESPIRATORY: No cough, shortness of breath, wheezing or hemoptysis.  CARDIOVASCULAR: No chest pain, orthopnea, edema.  GASTROINTESTINAL: No nausea, vomiting, diarrhea or abdominal pain.  GENITOURINARY: No dysuria, hematuria.  ENDOCRINE: No polyuria, nocturia,  HEMATOLOGY: No anemia, easy bruising or bleeding SKIN: No rash or lesion. MUSCULOSKELETAL: No joint pain or arthritis.   NEUROLOGIC: No tingling, numbness, weakness.  PSYCHIATRY: No anxiety or depression.   DRUG ALLERGIES:  No Known Allergies  VITALS:  Blood pressure 135/84, pulse (!) 102, temperature 97.9 F (36.6 C), temperature source Oral, resp. rate 20, height 5\' 4"  (1.626 m), weight 95.9 kg, last menstrual period 03/05/2019, SpO2 96 %.  PHYSICAL EXAMINATION:  GENERAL:  38 y.o.-year-old patient lying in the bed with no acute distress.  EYES: Pupils equal, round, reactive to light and accommodation. No scleral icterus. Extraocular muscles intact.  HEENT: Head atraumatic, normocephalic. Oropharynx and nasopharynx clear.  NECK:  Supple, no jugular venous distention. No thyroid enlargement, no tenderness.  LUNGS: Normal breath sounds bilaterally, no wheezing, rales,rhonchi or crepitation. No use of accessory muscles of respiration.  CARDIOVASCULAR: S1, S2 normal. No murmurs, rubs, or gallops.  ABDOMEN: Soft, nontender, nondistended. Bowel sounds present.  EXTREMITIES: No pedal edema, cyanosis, or clubbing.  NEUROLOGIC: Awake, alert and oriented x3. Sensation intact. Gait  not checked.  PSYCHIATRIC: The patient is alert and oriented x 3.  SKIN: No obvious rash, lesion, or ulcer.    LABORATORY PANEL:   CBC Recent Labs  Lab 03/19/19 0408  WBC 6.7  HGB 13.7  HCT 38.1  PLT 256   ------------------------------------------------------------------------------------------------------------------  Chemistries  Recent Labs  Lab 03/19/19 0408  03/20/19 0256  NA 136   < > 135  K 3.8   < > 3.5  CL 103   < > 102  CO2 16*   < > 20*  GLUCOSE 131*   < > 143*  BUN 9   < > 11  CREATININE 0.35*   < > 0.33*  CALCIUM 8.8*   < > 9.1  MG 1.8  --  1.8  AST 13*  --   --   ALT 12  --   --   ALKPHOS 41  --   --   BILITOT 0.2*  --   --    < > = values in this interval not displayed.   ------------------------------------------------------------------------------------------------------------------  Cardiac Enzymes No results for input(s): TROPONINI in the last 168 hours. ------------------------------------------------------------------------------------------------------------------  RADIOLOGY:  No results found.  EKG:  No orders found for this or any previous visit.  ASSESSMENT AND PLAN:    This is a 38 year old female admitted for pancreatitis. #Acute Pancreatitis: From hypertriglyceridemia  Tolerating soft diet; morphine as needed for severe pain.   The patient has persistent tachycardia but no indication of sepsis.    # Hypertriglyceridemia: Triglycerides are 3900 --3700--2300 today Continue insulin drip with a D5 Lopid p.o.  #.  Diabetes mellitus type 2: Once patient is off insulin drip adjust diabetic regimen and restart sliding scale  4.  Hypokalemia repleted and  recheck potassium 3.8  #.  Obesity: BMI is 36; encourage healthy diet and exercise    DVT prophylaxis: Lovenox  GI prophylaxis: PPI as needed    All the records are reviewed and case discussed with Care Management/Social Workerr. Management plans discussed with the patient,  she is in agreement.  Plan of care discussed with intensivist  CODE STATUS: fc   TOTAL TIME TAKING CARE OF THIS PATIENT: 33  minutes.   POSSIBLE D/C IN 2-  DAYS, DEPENDING ON CLINICAL CONDITION.  Note: This dictation was prepared with Dragon dictation along with smaller phrase technology. Any transcriptional errors that result from this process are unintentional.   Ramonita Lab M.D on 03/20/2019 at 11:58 AM  Between 7am to 6pm - Pager - [phone] After 6pm go to [email or web address] - password [email or web address]  [facility]  Office  [phone]  CC: Primary care physician; Patient, No Pcp Per

## 2019-03-21 LAB — COMPREHENSIVE METABOLIC PANEL
ALT: 14 U/L (ref 0–44)
AST: 14 U/L — ABNORMAL LOW (ref 15–41)
Albumin: 3.4 g/dL — ABNORMAL LOW (ref 3.5–5.0)
Alkaline Phosphatase: 42 U/L (ref 38–126)
Anion gap: 12 (ref 5–15)
BUN: 13 mg/dL (ref 6–20)
CO2: 21 mmol/L — ABNORMAL LOW (ref 22–32)
Calcium: 8.6 mg/dL — ABNORMAL LOW (ref 8.9–10.3)
Chloride: 104 mmol/L (ref 98–111)
Creatinine, Ser: 0.34 mg/dL — ABNORMAL LOW (ref 0.44–1.00)
GFR calc Af Amer: >60 mL/min (ref >60)
GFR calc non Af Amer: >60 mL/min (ref >60)
Glucose, Bld: 160 mg/dL — ABNORMAL HIGH (ref 70–99)
Potassium: 3.6 mmol/L (ref 3.5–5.1)
Sodium: 137 mmol/L (ref 135–145)
Total Bilirubin: 0.4 mg/dL (ref 0.3–1.2)
Total Protein: 6.7 g/dL (ref 6.5–8.1)

## 2019-03-21 LAB — CBC
HCT: 39.6 % (ref 36.0–46.0)
Hemoglobin: 13.6 g/dL (ref 12.0–15.0)
MCH: 29.7 pg (ref 26.0–34.0)
MCHC: 34.3 g/dL (ref 30.0–36.0)
MCV: 86.5 fL (ref 80.0–100.0)
Platelets: 256 10*3/uL (ref 150–400)
RBC: 4.58 MIL/uL (ref 3.87–5.11)
RDW: 13.2 % (ref 11.5–15.5)
WBC: 7.4 10*3/uL (ref 4.0–10.5)
nRBC: 0 % (ref 0.0–0.2)

## 2019-03-21 LAB — GLUCOSE, CAPILLARY
Glucose-Capillary: 174 mg/dL — ABNORMAL HIGH (ref 70–99)
Glucose-Capillary: 151 mg/dL — ABNORMAL HIGH (ref 70–99)
Glucose-Capillary: 143 mg/dL — ABNORMAL HIGH (ref 70–99)
Glucose-Capillary: 121 mg/dL — ABNORMAL HIGH (ref 70–99)
Glucose-Capillary: 219 mg/dL — ABNORMAL HIGH (ref 70–99)
Glucose-Capillary: 256 mg/dL — ABNORMAL HIGH (ref 70–99)

## 2019-03-21 LAB — TRIGLYCERIDES: Triglycerides: 2407 mg/dL — ABNORMAL HIGH (ref <150)

## 2019-03-21 MED ORDER — POTASSIUM CHLORIDE CRYS ER 20 MEQ PO TBCR
40.0000 meq | EXTENDED_RELEASE_TABLET | Freq: Once | ORAL | Status: DC
  Filled 2019-03-21: qty 2

## 2019-03-21 NOTE — Progress Notes (Signed)
Dr Belia Heman aware this am patient refused potassium and advised importance of potassium replacement. After lunch patient stated "Im not staying her another minute, I want AMA papers". Dr. Belia Heman made aware. Orders to give patient AMA papers. Explained it is against medical advise. Per patient she has already spoken with MD and that is her decision. Patient signed AMA paper work and ride is picking her up.

## 2019-03-21 NOTE — Progress Notes (Signed)
PATIENT WANTS TO GO HOME!  SHE WAS ADVISED THAT IF IS SHE WANTS TO GO HOME,  IT WILL BE AGAINST MEDICAL ADVISE.  SHE IS AT HIGH RISK FOR PROGRESSIVE SEPSIS AND CARDIAC ARREST AND DEATH.  THIS HAS BEEN RELAYED TO PATIENT MULTIPLE TIMES.  PATIENT WILL SIGN AMA PAPERS    Lucie Leather, M.D.  [facility]  Medical Director [facility] Medical Director [facility] Cardio-Pulmonary Department

## 2019-03-21 NOTE — Discharge Summary (Signed)
Physician Discharge Summary  Patient ID: [name] MRN: [id number] DOB/AGE: [birth year] 38 y.o.  Admit date: 03/16/2019 Discharge date: 03/21/2019  Admission Diagnoses:SEVERE PANCREATITS  Discharge Diagnoses:  Active Problems:   Pancreatitis   Discharged Condition:UNSTABLE  PATIENT WANTS TO GO HOME!  SHE WAS ADVISED THAT IF IS SHE WANTS TO GO HOME,  IT WILL BE AGAINST MEDICAL ADVISE.  SHE IS AT HIGH RISK FOR PROGRESSIVE SEPSIS AND CARDIAC ARREST AND DEATH.  THIS HAS BEEN RELAYED TO PATIENT MULTIPLE TIMES.  PATIENT WILL SIGN AMA PAPERS   Hospital Course:INSULIN INFUSION FOR VERY HIGH TG LEVELS     Discharge Exam: Blood pressure (!) 108/52, pulse 95, temperature 98.8 F (37.1 C), temperature source Oral, resp. rate (!) 21, height 5\' 4"  (1.626 m), weight 93.9 kg, last menstrual period 03/05/2019, SpO2 96 %.   Disposition:    PATIENT WANTS TO GO HOME!  SHE WAS ADVISED THAT IF IS SHE WANTS TO GO HOME,  IT WILL BE AGAINST MEDICAL ADVISE.  SHE IS AT HIGH RISK FOR PROGRESSIVE SEPSIS AND CARDIAC ARREST AND DEATH.  THIS HAS BEEN RELAYED TO PATIENT MULTIPLE TIMES.  PATIENT WILL SIGN AMA PAPERS     Signed: Erin Fulling 03/21/2019, 12:31 PM

## 2019-03-21 NOTE — TOC Transition Note (Signed)
Transition of Care [facility]) - CM/SW Discharge Note   Patient Details  Name: [name] MRN: [id number] Date of Birth: 12/16/1980  [facility] CM/SW Contact:  Eber Hong, RN Phone Number: 03/21/2019, 4:34 PM   Clinical Narrative:      Patient signed out AMA with tryglycerides at 2407, still requiring insulin drip       Patient Goals and CMS Choice        Discharge Placement                       Discharge Plan and Services                                     Social Determinants of Health (SDOH) Interventions     Readmission Risk Interventions No flowsheet data found.

## 2019-03-21 NOTE — Progress Notes (Signed)
[facility] - [place] [facility]   PATIENT NAME: [name]    MR#:  [id number]  DATE OF BIRTH:  14-Sep-1981  SUBJECTIVE:  CHIEF COMPLAINT: Patient is resting comfortably.  No overnight incidents denies any abdominal pain.  Triglycerides 2400 Not cooperating to frequent CBGs as endorsed by medical staff Desperately wanted to go home  REVIEW OF SYSTEMS:  CONSTITUTIONAL: No fever, fatigue or weakness.  EYES: No blurred or double vision.  EARS, NOSE, AND THROAT: No tinnitus or ear pain.  RESPIRATORY: No cough, shortness of breath, wheezing or hemoptysis.  CARDIOVASCULAR: No chest pain, orthopnea, edema.  GASTROINTESTINAL: No nausea, vomiting, diarrhea or abdominal pain.  GENITOURINARY: No dysuria, hematuria.  ENDOCRINE: No polyuria, nocturia,  HEMATOLOGY: No anemia, easy bruising or bleeding SKIN: No rash or lesion. MUSCULOSKELETAL: No joint pain or arthritis.   NEUROLOGIC: No tingling, numbness, weakness.  PSYCHIATRY: No anxiety or depression.   DRUG ALLERGIES:  No Known Allergies  VITALS:  Blood pressure (!) 108/52, pulse 95, temperature 98.8 F (37.1 C), temperature source Oral, resp. rate (!) 21, height 5\' 4"  (1.626 m), weight 93.9 kg, last menstrual period 03/05/2019, SpO2 96 %.  PHYSICAL EXAMINATION:  GENERAL:  38 y.o.-year-old patient lying in the bed with no acute distress.  EYES: Pupils equal, round, reactive to light and accommodation. No scleral icterus. Extraocular muscles intact.  HEENT: Head atraumatic, normocephalic. Oropharynx and nasopharynx clear.  NECK:  Supple, no jugular venous distention. No thyroid enlargement, no tenderness.  LUNGS: Normal breath sounds bilaterally, no wheezing, rales,rhonchi or crepitation. No use of accessory muscles of respiration.  CARDIOVASCULAR: S1, S2 normal. No murmurs, rubs, or gallops.  ABDOMEN: Soft, nontender, nondistended. Bowel sounds present.  EXTREMITIES: No pedal edema, cyanosis, or clubbing.   NEUROLOGIC: Awake, alert and oriented x3. Sensation intact. Gait not checked.  PSYCHIATRIC: The patient is alert and oriented x 3.  SKIN: No obvious rash, lesion, or ulcer.    LABORATORY PANEL:   CBC Recent Labs  Lab 03/21/19 0428  WBC 7.4  HGB 13.6  HCT 39.6  PLT 256   ------------------------------------------------------------------------------------------------------------------  Chemistries  Recent Labs  Lab 03/20/19 0256  03/21/19 0428  NA 135   < > 137  K 3.5   < > 3.6  CL 102   < > 104  CO2 20*   < > 21*  GLUCOSE 143*   < > 160*  BUN 11   < > 13  CREATININE 0.33*   < > 0.34*  CALCIUM 9.1   < > 8.6*  MG 1.8  --   --   AST  --   --  14*  ALT  --   --  14  ALKPHOS  --   --  42  BILITOT  --   --  0.4   < > = values in this interval not displayed.   ------------------------------------------------------------------------------------------------------------------  Cardiac Enzymes No results for input(s): TROPONINI in the last 168 hours. ------------------------------------------------------------------------------------------------------------------  RADIOLOGY:  No results found.  EKG:  No orders found for this or any previous visit.  ASSESSMENT AND PLAN:    This is a 38 year old female admitted for pancreatitis.  #Acute Pancreatitis: From hypertriglyceridemia  Patient is asymptomatic and hemodynamically stable Tolerating soft diet; morphine as needed for severe pain.   The patient has persistent tachycardia but no indication of sepsis.    # Hypertriglyceridemia: Triglycerides are 3900 --3700--2300-2400 today Continue insulin drip with a D5 Reinforced the importance of checking CBGs frequently when  patient is on insulin drip.  Patient verbalized understanding Lopid p.o.  #.  Diabetes mellitus type 2: Once patient is off insulin drip adjust diabetic regimen and restart sliding scale  4.  Hypokalemia repleted and recheck potassium 3.6  #.   Obesity: BMI is 36; encourage healthy diet and exercise    DVT prophylaxis: Lovenox  GI prophylaxis: PPI as needed    All the records are reviewed and case discussed with Care Management/Social Workerr. Management plans discussed with the patient, she is in agreement.  Plan of care discussed with intensivist  CODE STATUS: fc   TOTAL TIME TAKING CARE OF THIS PATIENT: 33  minutes.   POSSIBLE D/C IN 2-  DAYS, DEPENDING ON CLINICAL CONDITION.  Note: This dictation was prepared with Dragon dictation along with smaller phrase technology. Any transcriptional errors that result from this process are unintentional.   Ramonita LabAruna Bradey Luzier M.D on 03/21/2019 at 11:50 AM  Between 7am to 6pm - Pager - [phone] After 6pm go to [email or web address] - password [email or web address]  [facility]  Office  [phone]  CC: Primary care physician; Patient, No Pcp Per

## 2019-03-21 NOTE — Progress Notes (Addendum)
Patient left AMA 12:56 03/21/19.

## 2019-03-25 ENCOUNTER — Ambulatory Visit (INDEPENDENT_AMBULATORY_CARE_PROVIDER_SITE_OTHER): Payer: BLUE CROSS/BLUE SHIELD | Admitting: Primary Care

## 2019-03-25 ENCOUNTER — Other Ambulatory Visit: Payer: Self-pay

## 2019-03-25 ENCOUNTER — Encounter: Payer: Self-pay | Admitting: Primary Care

## 2019-03-25 ENCOUNTER — Telehealth: Payer: Self-pay | Admitting: Primary Care

## 2019-03-25 DIAGNOSIS — E781 Pure hyperglyceridemia: Secondary | ICD-10-CM | POA: Diagnosis not present

## 2019-03-25 DIAGNOSIS — K858 Other acute pancreatitis without necrosis or infection: Secondary | ICD-10-CM | POA: Diagnosis not present

## 2019-03-25 DIAGNOSIS — I10 Essential (primary) hypertension: Secondary | ICD-10-CM

## 2019-03-25 DIAGNOSIS — Z794 Long term (current) use of insulin: Secondary | ICD-10-CM

## 2019-03-25 DIAGNOSIS — E119 Type 2 diabetes mellitus without complications: Secondary | ICD-10-CM

## 2019-03-25 NOTE — Progress Notes (Signed)
Subjective:    Patient ID: [name], female    DOB: 1981-03-19, 38 y.o.   MRN: [id number]  HPI  Virtual Visit via Video Note  I connected with [name] on 03/25/19 at  3:40 PM EDT by a video enabled telemedicine application and verified that I am speaking with the correct person using two identifiers.   I discussed the limitations of evaluation and management by telemedicine and the availability of in person appointments. The patient expressed understanding and agreed to proceed. She is at home, I am in the office.  History of Present Illness:  [name] is a 38 year old female who presents today to establish care and discuss the problems mentioned below. Will obtain/review records.  1) Hospital Follow Up:  Patient presented to [facility] ED with a chief complaint of several week history of epigastric abdominal pain with radiation through her back. She has a prior history of pancreatitis and these symptoms felt similar. Her initial triglyceride level was >5000 so she was admitted for further treatment.  During her hospital stay she was treated with a clear liquid diet, morphine for pain, D5 IV fluids. She was noted to be tachycardic without signs of sepsis. Trigs gradually reduced from >5000 down to 2400 prior to discharge. Her diabetes was treated with insulin via pump. She was initiated on Lopid orally during her stay. She initially had hypokalemia which improved with potassium replacement. She ended up leaving AMA on 03/21/19.  Today she endorses four episodes of pancreatitis within 6 years, her last flare was 3 years ago. She's been managed on Gemfibrozil and pravastatin at one point, but has not had medications in years. She does not take Fish Oil.  Diet currently consists of:  Breakfast: Skips Lunch: Fast food, restaurants  Dinner: Meat, vegetable, starch, pasta Snacks: Popcorn, nuts Desserts: 3 days weekly  Beverages: Water, diet soda  Exercise: She is not exercising   Overalls she's feeling much better. She was not sent home with any discharge instructions or prescriptions as she left AMA.   2) Hypertriglyceridemia: Recent hospital admission for acute pancreatitis with Trigs of 3900, 3700, 2300, and 2400. She was once managed on gemfibrozil and pravastatin for 1-2 years, not sure if this was helpful. She does have a history of medication non compliance and has been off of medications for years.   3) Type 2 Diabetes: Diagnosed 15 years ago. She is currently managed on Lantus 60 units HS and Novolin R sliding scale three times daily before meals (15-20 units at time).   She was checking her glucose 1-2 times daily and was getting readings ranging 140's-low 200's. She's not checked her glucose in several months as she needs to order more test strips. She will often get discouraged about her diabetes and will refuse to check her glucose levels. She has a stock pile of Lantus from free samples over the years. She thinks she was on lisinopril at one point but has not taken in years.     Observations/Objective:  Alert and oriented. Appears well. No distress.  Assessment and Plan:  See problem based charting.  Follow Up Instructions:  We will contact you for a lab appointment, make sure to be fasting four hours prior.   I will be in touch with results and we will get you on treatment for your cholesterol and diabetes.   You MUST check your blood sugars at least three times daily before meals when injecting the Novolin R insulin. Write down  your readings so I can take a look.  It was a pleasure to meet you today! Please don't hesitate to call or message me with any questions. Welcome to [facility]!     I discussed the assessment and treatment plan with the patient. The patient was provided an opportunity to ask questions and all were answered. The patient agreed with the plan and demonstrated an understanding of the instructions.   The patient was advised  to call back or seek an in-person evaluation if the symptoms worsen or if the condition fails to improve as anticipated.     Doreene NestKatherine K Cinnamon Morency, NP    Review of Systems  Constitutional: Negative for unexpected weight change.  Eyes: Negative for visual disturbance.  Respiratory: Negative for shortness of breath.   Cardiovascular: Negative for chest pain.  Gastrointestinal: Negative for abdominal pain and vomiting.  Endocrine: Negative for polyuria.  Genitourinary: Negative for menstrual problem.  Skin: Negative for color change.  Neurological: Negative for dizziness.  Hematological: Negative for adenopathy.       Past Medical History:  Diagnosis Date  . Diabetes mellitus   . Hypercholesteremia   . Hypertension   . Pancreatitis      Social History   Socioeconomic History  . Marital status: Single    Spouse name: Not on file  . Number of children: Not on file  . Years of education: Not on file  . Highest education level: Not on file  Occupational History  . Not on file  Social Needs  . Financial resource strain: Not on file  . Food insecurity:    Worry: Not on file    Inability: Not on file  . Transportation needs:    Medical: Not on file    Non-medical: Not on file  Tobacco Use  . Smoking status: Former Games developermoker  . Smokeless tobacco: Never Used  Substance and Sexual Activity  . Alcohol use: No  . Drug use: No  . Sexual activity: Never  Lifestyle  . Physical activity:    Days per week: Not on file    Minutes per session: Not on file  . Stress: Not on file  Relationships  . Social connections:    Talks on phone: Not on file    Gets together: Not on file    Attends religious service: Not on file    Active member of club or organization: Not on file    Attends meetings of clubs or organizations: Not on file    Relationship status: Not on file  . Intimate partner violence:    Fear of current or ex partner: Not on file    Emotionally abused: Not on file     Physically abused: Not on file    Forced sexual activity: Not on file  Other Topics Concern  . Not on file  Social History Narrative  . Not on file    Past Surgical History:  Procedure Laterality Date  . KNEE SURGERY      Family History  Problem Relation Age of Onset  . Cancer Other   . Diabetes Other   . Hypertension Other   . Cancer Paternal Grandfather     No Known Allergies  Current Outpatient Medications on File Prior to Visit  Medication Sig Dispense Refill  . insulin glargine (LANTUS) 100 UNIT/ML injection Inject 0.4 mLs (40 Units total) into the skin daily. (Patient taking differently: Inject 60 Units into the skin at bedtime. ) 10 mL 11  . insulin  regular (NOVOLIN R) 100 units/mL injection Inject 0.08 mLs (8 Units total) into the skin 3 (three) times daily before meals. (Patient taking differently: Inject into the skin 3 (three) times daily before meals. per sliding scale) 10 mL 11   No current facility-administered medications on file prior to visit.     LMP 03/05/2019 (Approximate)    Objective:   Physical Exam  Constitutional: She is oriented to person, place, and time. She appears well-nourished.  HENT:  Head: Normocephalic.  Neck: Neck supple.  Respiratory: Effort normal.  Musculoskeletal: Normal range of motion.  Neurological: She is alert and oriented to person, place, and time.  Skin: Skin is dry.  Psychiatric: She has a normal mood and affect.           Assessment & Plan:

## 2019-03-25 NOTE — Telephone Encounter (Signed)
[name], this is a new patient that I saw on 03/25/19. She needs a fasting lab only appointment for this week, the sooner the better. She will also need a full set of vital signs, will cc this to [place] as well. Not sure if this is a nurse visit. She needs access to My Chart, not sure how to go about this. Can you help?  Will you please send this back to me confirming that she has this all scheduled?  Thanks!

## 2019-03-25 NOTE — Assessment & Plan Note (Addendum)
Chronic and recent admission for same. Patient left AMA with last trig reading of 2400. It appears that gemfibrozil was intended to be prescribed but this didn't occur.  Check lipids again. Consider Lopid and statin if needed. Await results.   All hospital labs, imaging, notes reviewed

## 2019-03-25 NOTE — Assessment & Plan Note (Signed)
Chronic. No A1C checked during recent hospital stay. She is compliant to Lantus 60 units HS and Novolin R sliding scale.   From chart review it looks like she was on Victoza, Metformin, Saxagliptin at one point. Consider re-initiation of Metformin at minimum.   Discussed that it is imperative that she check glucose readings when on insulin, especially Novolin R. She will start checking three times daily before meals and start recording readings.   Follow up to be determined from lab results. A1c and urine microalbumin pending.

## 2019-03-25 NOTE — Assessment & Plan Note (Signed)
No recorded vitals today as visit was conducted virtually. We will have all vitals checked during upcoming lab appointment.   She was once on lisinopril 10 mg according to chart review. Consider re-initiation once vitals have been taken. BP during hospital stay seemed stable.

## 2019-03-25 NOTE — Patient Instructions (Signed)
We will contact you for a lab appointment, make sure to be fasting four hours prior.   I will be in touch with results and we will get you on treatment for your cholesterol and diabetes.   You MUST check your blood sugars at least three times daily before meals when injecting the Novolin R insulin. Write down your readings so I can take a look.  It was a pleasure to meet you today! Please don't hesitate to call or message me with any questions. Welcome to [facility]!

## 2019-03-25 NOTE — Assessment & Plan Note (Signed)
Recent admission for acute pancreatitis secondary to hypertriglyceridemia. Repeat lipids pending.  Consider adding Gemfibrozil, maybe statin given history of diabetes  Await labs.

## 2019-03-26 NOTE — Telephone Encounter (Signed)
Noted  

## 2019-03-26 NOTE — Telephone Encounter (Signed)
Scheduled patient for labs and a NV to check vitals tomorrow at 8:15.

## 2019-03-26 NOTE — Telephone Encounter (Signed)
Noted, labs ordered.

## 2019-03-26 NOTE — Addendum Note (Signed)
Addended by: Doreene Nest on: 03/26/2019 10:13 AM   Modules accepted: Orders

## 2019-03-27 ENCOUNTER — Encounter: Payer: Self-pay | Admitting: *Deleted

## 2019-03-27 ENCOUNTER — Other Ambulatory Visit: Payer: Self-pay | Admitting: Primary Care

## 2019-03-27 ENCOUNTER — Other Ambulatory Visit (INDEPENDENT_AMBULATORY_CARE_PROVIDER_SITE_OTHER): Payer: BLUE CROSS/BLUE SHIELD

## 2019-03-27 ENCOUNTER — Other Ambulatory Visit: Payer: Self-pay

## 2019-03-27 ENCOUNTER — Ambulatory Visit: Payer: BLUE CROSS/BLUE SHIELD | Admitting: *Deleted

## 2019-03-27 VITALS — BP 132/84 | HR 113 | Temp 98.7°F | Ht 64.0 in | Wt 209.4 lb

## 2019-03-27 DIAGNOSIS — E781 Pure hyperglyceridemia: Secondary | ICD-10-CM

## 2019-03-27 DIAGNOSIS — E119 Type 2 diabetes mellitus without complications: Secondary | ICD-10-CM | POA: Diagnosis not present

## 2019-03-27 DIAGNOSIS — I10 Essential (primary) hypertension: Secondary | ICD-10-CM

## 2019-03-27 DIAGNOSIS — Z794 Long term (current) use of insulin: Secondary | ICD-10-CM

## 2019-03-27 LAB — COMPREHENSIVE METABOLIC PANEL
ALT: 8 U/L (ref 0–35)
AST: 22 U/L (ref 0–37)
Albumin: 3.9 g/dL (ref 3.5–5.2)
Alkaline Phosphatase: 46 U/L (ref 39–117)
BUN: 17 mg/dL (ref 6–23)
CO2: 22 mEq/L (ref 19–32)
Calcium: 8.2 mg/dL — ABNORMAL LOW (ref 8.4–10.5)
Chloride: 99 mEq/L (ref 96–112)
Creatinine, Ser: 0.54 mg/dL (ref 0.40–1.20)
GFR: 125.53 mL/min (ref >60.00)
Glucose, Bld: 254 mg/dL — ABNORMAL HIGH (ref 70–99)
Potassium: 4 mEq/L (ref 3.5–5.1)
Sodium: 131 mEq/L — ABNORMAL LOW (ref 135–145)
Total Bilirubin: 0.2 mg/dL (ref 0.2–1.2)
Total Protein: 6.6 g/dL (ref 6.0–8.3)

## 2019-03-27 LAB — MICROALBUMIN / CREATININE URINE RATIO
Creatinine,U: 53.6 mg/dL
Microalb Creat Ratio: 83.7 mg/g — ABNORMAL HIGH (ref 0.0–30.0)
Microalb, Ur: 44.8 mg/dL — ABNORMAL HIGH (ref 0.0–1.9)

## 2019-03-27 LAB — HEMOGLOBIN A1C: Hgb A1c MFr Bld: 10.6 % — ABNORMAL HIGH (ref 4.6–6.5)

## 2019-03-27 LAB — LIPID PANEL
Cholesterol: 256 mg/dL — ABNORMAL HIGH (ref 0–200)
HDL: 26.1 mg/dL — ABNORMAL LOW (ref >39.00)
Total CHOL/HDL Ratio: 10
Triglycerides: 2801 mg/dL — ABNORMAL HIGH (ref 0.0–149.0)

## 2019-03-27 LAB — TSH: TSH: 3.59 u[IU]/mL (ref 0.35–4.50)

## 2019-03-27 LAB — LDL CHOLESTEROL, DIRECT: Direct LDL: 44 mg/dL

## 2019-03-27 MED ORDER — METFORMIN HCL 1000 MG PO TABS: 1000.0000 mg | ORAL_TABLET | Freq: Two times a day (BID) | ORAL | 1 refills | Status: DC

## 2019-03-27 MED ORDER — GEMFIBROZIL 600 MG PO TABS: ORAL_TABLET | ORAL | 1 refills | Status: DC

## 2019-03-27 MED ORDER — LISINOPRIL 5 MG PO TABS: 5.0000 mg | ORAL_TABLET | Freq: Every day | ORAL | 1 refills | Status: DC

## 2019-03-27 NOTE — Addendum Note (Signed)
Addended by: Alvina Chou on: 03/27/2019 11:04 AM   Modules accepted: Orders

## 2019-03-27 NOTE — Addendum Note (Signed)
Addended by: Tawnya Crook on: 03/27/2019 03:31 PM   Modules accepted: Level of Service

## 2019-04-25 ENCOUNTER — Encounter: Payer: Self-pay | Admitting: Primary Care

## 2019-04-25 ENCOUNTER — Ambulatory Visit (INDEPENDENT_AMBULATORY_CARE_PROVIDER_SITE_OTHER): Payer: BLUE CROSS/BLUE SHIELD | Admitting: Primary Care

## 2019-04-25 ENCOUNTER — Other Ambulatory Visit: Payer: BLUE CROSS/BLUE SHIELD

## 2019-04-25 ENCOUNTER — Other Ambulatory Visit: Payer: Self-pay

## 2019-04-25 VITALS — BP 122/80 | HR 100 | Temp 97.9°F | Ht 64.0 in | Wt 211.5 lb

## 2019-04-25 DIAGNOSIS — Z794 Long term (current) use of insulin: Secondary | ICD-10-CM

## 2019-04-25 DIAGNOSIS — I10 Essential (primary) hypertension: Secondary | ICD-10-CM

## 2019-04-25 DIAGNOSIS — E781 Pure hyperglyceridemia: Secondary | ICD-10-CM

## 2019-04-25 DIAGNOSIS — Z23 Encounter for immunization: Secondary | ICD-10-CM | POA: Diagnosis not present

## 2019-04-25 DIAGNOSIS — E119 Type 2 diabetes mellitus without complications: Secondary | ICD-10-CM | POA: Diagnosis not present

## 2019-04-25 DIAGNOSIS — G629 Polyneuropathy, unspecified: Secondary | ICD-10-CM | POA: Diagnosis not present

## 2019-04-25 LAB — LIPID PANEL
Cholesterol: 1052 mg/dL — ABNORMAL HIGH (ref 0–200)
HDL: 8.1 mg/dL — ABNORMAL LOW (ref >39.00)
Total CHOL/HDL Ratio: 130
Triglycerides: 6077 mg/dL — ABNORMAL HIGH (ref 0.0–149.0)

## 2019-04-25 LAB — LDL CHOLESTEROL, DIRECT: Direct LDL: 134 mg/dL

## 2019-04-25 MED ORDER — GABAPENTIN 300 MG PO CAPS: 300.0000 mg | ORAL_CAPSULE | Freq: Every day | ORAL | 0 refills | Status: DC

## 2019-04-25 MED ORDER — INSULIN GLARGINE 100 UNIT/ML ~~LOC~~ SOLN: 65.0000 [IU] | Freq: Every day | SUBCUTANEOUS | 11 refills | Status: DC

## 2019-04-25 MED ORDER — INSULIN REGULAR HUMAN 100 UNIT/ML IJ SOLN: 20.0000 [IU] | Freq: Three times a day (TID) | INTRAMUSCULAR | 11 refills | Status: DC

## 2019-04-25 NOTE — Patient Instructions (Addendum)
We've increased the dose of your Lantus to 65 units at bedtime.  We've increased your Novolin to 20 units three times daily before meals.   Stop by the lab prior to leaving today. I will notify you of your results once received.   It is important that you improve your diet. Please limit carbohydrates in the form of white bread, rice, pasta, sweets, fast food, fried food, sugary drinks, etc. Increase your consumption of fresh fruits and vegetables, whole grains, lean protein.  Ensure you are consuming 64 ounces of water daily.  You may try the gabapentin 300 mg capsules at bedtime for neuropathy.   Please schedule a follow up appointment in 1 month for diabetes check.  It was a pleasure to see you today!   Diabetes Mellitus and Nutrition, Adult When you have diabetes (diabetes mellitus), it is very important to have healthy eating habits because your blood sugar (glucose) levels are greatly affected by what you eat and drink. Eating healthy foods in the appropriate amounts, at about the same times every day, can help you:  Control your blood glucose.  Lower your risk of heart disease.  Improve your blood pressure.  Reach or maintain a healthy weight. Every person with diabetes is different, and each person has different needs for a meal plan. Your health care provider may recommend that you work with a diet and nutrition specialist (dietitian) to make a meal plan that is best for you. Your meal plan may vary depending on factors such as:  The calories you need.  The medicines you take.  Your weight.  Your blood glucose, blood pressure, and cholesterol levels.  Your activity level.  Other health conditions you have, such as heart or kidney disease. How do carbohydrates affect me? Carbohydrates, also called carbs, affect your blood glucose level more than any other type of food. Eating carbs naturally raises the amount of glucose in your blood. Carb counting is a method for  keeping track of how many carbs you eat. Counting carbs is important to keep your blood glucose at a healthy level, especially if you use insulin or take certain oral diabetes medicines. It is important to know how many carbs you can safely have in each meal. This is different for every person. Your dietitian can help you calculate how many carbs you should have at each meal and for each snack. Foods that contain carbs include:  Bread, cereal, rice, pasta, and crackers.  Potatoes and corn.  Peas, beans, and lentils.  Milk and yogurt.  Fruit and juice.  Desserts, such as cakes, cookies, ice cream, and candy. How does alcohol affect me? Alcohol can cause a sudden decrease in blood glucose (hypoglycemia), especially if you use insulin or take certain oral diabetes medicines. Hypoglycemia can be a life-threatening condition. Symptoms of hypoglycemia (sleepiness, dizziness, and confusion) are similar to symptoms of having too much alcohol. If your health care provider says that alcohol is safe for you, follow these guidelines:  Limit alcohol intake to no more than 1 drink per day for nonpregnant women and 2 drinks per day for men. One drink equals 12 oz of beer, 5 oz of wine, or 1 oz of hard liquor.  Do not drink on an empty stomach.  Keep yourself hydrated with water, diet soda, or unsweetened iced tea.  Keep in mind that regular soda, juice, and other mixers may contain a lot of sugar and must be counted as carbs. What are tips for following this  plan?  Reading food labels  Start by checking the serving size on the "Nutrition Facts" label of packaged foods and drinks. The amount of calories, carbs, fats, and other nutrients listed on the label is based on one serving of the item. Many items contain more than one serving per package.  Check the total grams (g) of carbs in one serving. You can calculate the number of servings of carbs in one serving by dividing the total carbs by 15. For  example, if a food has 30 g of total carbs, it would be equal to 2 servings of carbs.  Check the number of grams (g) of saturated and trans fats in one serving. Choose foods that have low or no amount of these fats.  Check the number of milligrams (mg) of salt (sodium) in one serving. Most people should limit total sodium intake to less than 2,300 mg per day.  Always check the nutrition information of foods labeled as "low-fat" or "nonfat". These foods may be higher in added sugar or refined carbs and should be avoided.  Talk to your dietitian to identify your daily goals for nutrients listed on the label. Shopping  Avoid buying canned, premade, or processed foods. These foods tend to be high in fat, sodium, and added sugar.  Shop around the outside edge of the grocery store. This includes fresh fruits and vegetables, bulk grains, fresh meats, and fresh dairy. Cooking  Use low-heat cooking methods, such as baking, instead of high-heat cooking methods like deep frying.  Cook using healthy oils, such as olive, canola, or sunflower oil.  Avoid cooking with butter, cream, or high-fat meats. Meal planning  Eat meals and snacks regularly, preferably at the same times every day. Avoid going long periods of time without eating.  Eat foods high in fiber, such as fresh fruits, vegetables, beans, and whole grains. Talk to your dietitian about how many servings of carbs you can eat at each meal.  Eat 4-6 ounces (oz) of lean protein each day, such as lean meat, chicken, fish, eggs, or tofu. One oz of lean protein is equal to: ? 1 oz of meat, chicken, or fish. ? 1 egg. ?  cup of tofu.  Eat some foods each day that contain healthy fats, such as avocado, nuts, seeds, and fish. Lifestyle  Check your blood glucose regularly.  Exercise regularly as told by your health care provider. This may include: ? 150 minutes of moderate-intensity or vigorous-intensity exercise each week. This could be brisk  walking, biking, or water aerobics. ? Stretching and doing strength exercises, such as yoga or weightlifting, at least 2 times a week.  Take medicines as told by your health care provider.  Do not use any products that contain nicotine or tobacco, such as cigarettes and e-cigarettes. If you need help quitting, ask your health care provider.  Work with a Veterinary surgeoncounselor or diabetes educator to identify strategies to manage stress and any emotional and social challenges. Questions to ask a health care provider  Do I need to meet with a diabetes educator?  Do I need to meet with a dietitian?  What number can I call if I have questions?  When are the best times to check my blood glucose? Where to find more information:  American Diabetes Association: diabetes.org  Academy of Nutrition and Dietetics: www.eatright.[employer]org  National Institute of Diabetes and Digestive and Kidney Diseases (NIH): [employer].tnwww.niddk.nih.gov Summary  A healthy meal plan will help you control your blood glucose and maintain  a healthy lifestyle.  Working with a diet and nutrition specialist (dietitian) can help you make a meal plan that is best for you.  Keep in mind that carbohydrates (carbs) and alcohol have immediate effects on your blood glucose levels. It is important to count carbs and to use alcohol carefully. This information is not intended to replace advice given to you by your health care provider. Make sure you discuss any questions you have with your health care provider. Document Released: 08/11/2005 Document Revised: 06/14/2017 Document Reviewed: 12/19/2016 Elsevier Interactive Patient Education  2019 [employer].

## 2019-04-25 NOTE — Progress Notes (Signed)
Subjective:    Patient ID: [name], female    DOB: 04-17-1981, 38 y.o.   MRN: [id number]  HPI  [name] is a 38 year old female who presents today for follow up.  1) Type 2 Diabetes:  Current medications include: Metformin 1000 mg BID, Lantus 60 units HS and Novolin R sliding scale (15 TID on average).   She is checking her blood glucose 4 times daily and is getting readings of:  AM fasting: mid-high 200's 2 hours after breakfast: mid to high 300's Before lunch: mid 200's, 300's 2 hours after lunch: mid to high 300's  Last A1C: 10.6 in late April 2020 Last Eye Exam: No recent exam  Last Foot Exam: Due today Pneumonia Vaccination: Due ACE/ARB: Lisinopril  Statin: None.  Diet currently consists of:  Breakfast: Cereal, eggs,  Lunch: Sandwich, salad, take out, stir fry, pizza Dinner: Sandwich, take out, pizza, chicken nuggets, hot dogs Snacks: Nuts, beef jerky  Desserts: Sugar free jello Beverages: Water, milk, diet soda  Exercise: She is not exercising   She does endorse chronic bilateral plantar foot numbness/tinlging/pain. Was once on Lyrica in the past and took twice daily, has not had this for the last 1 year. She's never been on gabapentin. She always wears shoes.   2) Hypertriglyceridemia: Currently managed on gemfibrozil 600 mg BID. Admission for acute pancreatitis secondary to hypertriglyceridemia in April 2020. Trigs at 2801 on 03/27/19, gemfibrozil initiated at that point.   3) Essential Hypertension: Currently managed on lisinopril 5 mg, mostly for renal protection against diabetes. She denies chest pain, dizziness.   BP Readings from Last 3 Encounters:  04/25/19 122/80  03/27/19 132/84  03/21/19 122/73     Review of Systems  Eyes:       Intermittent blurred vision with hyperglycemia  Respiratory: Negative for shortness of breath.   Cardiovascular: Negative for chest pain.  Neurological: Positive for numbness. Negative for dizziness.        Past Medical History:  Diagnosis Date  . Diabetes mellitus   . Hypercholesteremia   . Hypertension   . Pancreatitis      Social History   Socioeconomic History  . Marital status: Single    Spouse name: Not on file  . Number of children: Not on file  . Years of education: Not on file  . Highest education level: Not on file  Occupational History  . Not on file  Social Needs  . Financial resource strain: Not on file  . Food insecurity:    Worry: Not on file    Inability: Not on file  . Transportation needs:    Medical: Not on file    Non-medical: Not on file  Tobacco Use  . Smoking status: Former Games developer  . Smokeless tobacco: Never Used  Substance and Sexual Activity  . Alcohol use: No  . Drug use: No  . Sexual activity: Never  Lifestyle  . Physical activity:    Days per week: Not on file    Minutes per session: Not on file  . Stress: Not on file  Relationships  . Social connections:    Talks on phone: Not on file    Gets together: Not on file    Attends religious service: Not on file    Active member of club or organization: Not on file    Attends meetings of clubs or organizations: Not on file    Relationship status: Not on file  . Intimate partner violence:  Fear of current or ex partner: Not on file    Emotionally abused: Not on file    Physically abused: Not on file    Forced sexual activity: Not on file  Other Topics Concern  . Not on file  Social History Narrative  . Not on file    Past Surgical History:  Procedure Laterality Date  . KNEE SURGERY      Family History  Problem Relation Age of Onset  . Cancer Other   . Diabetes Other   . Hypertension Other   . Cancer Paternal Grandfather   . Depression Mother   . Diabetes Father   . Hypertension Father   . Depression Brother     No Known Allergies  Current Outpatient Medications on File Prior to Visit  Medication Sig Dispense Refill  . gemfibrozil (LOPID) 600 MG tablet Take 1 tablet by  mouth 30 min prior to breakfast and 1 tablet 30 min prior to dinner. 180 tablet 1  . insulin glargine (LANTUS) 100 UNIT/ML injection Inject 0.4 mLs (40 Units total) into the skin daily. (Patient taking differently: Inject 60 Units into the skin at bedtime. ) 10 mL 11  . insulin regular (NOVOLIN R) 100 units/mL injection Inject 0.08 mLs (8 Units total) into the skin 3 (three) times daily before meals. (Patient taking differently: Inject into the skin 3 (three) times daily before meals. per sliding scale) 10 mL 11  . lisinopril (ZESTRIL) 5 MG tablet Take 1 tablet (5 mg total) by mouth daily. For blood pressure and kidney protection. 90 tablet 1  . metFORMIN (GLUCOPHAGE) 1000 MG tablet Take 1 tablet (1,000 mg total) by mouth 2 (two) times daily with a meal. 180 tablet 1   No current facility-administered medications on file prior to visit.     BP 122/80   Pulse 100   Temp 97.9 F (36.6 C) (Tympanic)   Ht 5\' 4"  (1.626 m)   Wt 211 lb 8 oz (95.9 kg)   LMP 04/25/2019   SpO2 97%   BMI 36.30 kg/m    Objective:   Physical Exam  Constitutional: She appears well-nourished.  Neck: Neck supple.  Cardiovascular: Normal rate and regular rhythm.  Respiratory: Effort normal and breath sounds normal.  Skin: Skin is warm and dry.  Psychiatric: She has a normal mood and affect.           Assessment & Plan:

## 2019-04-25 NOTE — Assessment & Plan Note (Signed)
Uncontrolled with hyperglycemia with all readings. Suspect this is a combination of her diet with insulin resistance.   Managed on ACE for renal protection. Consider adding statin. Pneumonia vaccination due today, provided.  Referral for eye exam placed.  Foot exam completed today.  Increase Lantus to 65 units HS, increase Novolin R to 20 units TID with meals. Cannot add GLP-1 agonist given history of pancreatitis. Offered diabetic nutritionist, she declines.   Rx for gabapentin provided to use HS for neuropathy. She will update.   Follow up in 1 month with glucose logs.

## 2019-04-25 NOTE — Assessment & Plan Note (Signed)
Compliant to gemfibrozil, repeat lipids pending. Strongly advised she work on her diet.

## 2019-04-25 NOTE — Assessment & Plan Note (Signed)
Chronic and from diabetes. Add in gabapentin HS, she will update.

## 2019-04-25 NOTE — Assessment & Plan Note (Signed)
At goal on lisinopril 5 mg, BMP pending. Continue same.

## 2019-04-25 NOTE — Addendum Note (Signed)
Addended by: Tawnya Crook on: 04/25/2019 09:19 AM   Modules accepted: Orders

## 2019-04-26 ENCOUNTER — Other Ambulatory Visit: Payer: Self-pay | Admitting: Primary Care

## 2019-04-26 DIAGNOSIS — E781 Pure hyperglyceridemia: Secondary | ICD-10-CM

## 2019-04-26 LAB — COMPREHENSIVE METABOLIC PANEL
AG Ratio: 1.2 (calc) (ref 1.0–2.5)
ALT: 8 U/L (ref 6–29)
AST: 11 U/L (ref 10–30)
Albumin: 4 g/dL (ref 3.6–5.1)
Alkaline phosphatase (APISO): 56 U/L (ref 31–125)
BUN: 16 mg/dL (ref 7–25)
CO2: 11 mmol/L — ABNORMAL LOW (ref 20–32)
Calcium: 9.4 mg/dL (ref 8.6–10.2)
Chloride: 98 mmol/L (ref 98–110)
Creat: 0.62 mg/dL (ref 0.50–1.10)
Globulin: 3.3 g/dL (calc) (ref 1.9–3.7)
Glucose, Bld: 273 mg/dL — ABNORMAL HIGH (ref 65–99)
Potassium: 4.6 mmol/L (ref 3.5–5.3)
Sodium: 134 mmol/L — ABNORMAL LOW (ref 135–146)
Total Bilirubin: 0.2 mg/dL (ref 0.2–1.2)
Total Protein: 7.3 g/dL (ref 6.1–8.1)

## 2019-04-26 MED ORDER — ATORVASTATIN CALCIUM 20 MG PO TABS: 20.0000 mg | ORAL_TABLET | Freq: Every day | ORAL | 0 refills | Status: DC

## 2019-04-26 MED ORDER — FENOFIBRATE 145 MG PO TABS: 145.0000 mg | ORAL_TABLET | Freq: Every day | ORAL | 0 refills | Status: DC

## 2019-05-27 ENCOUNTER — Ambulatory Visit (INDEPENDENT_AMBULATORY_CARE_PROVIDER_SITE_OTHER): Payer: BC Managed Care – PPO | Admitting: Primary Care

## 2019-05-27 ENCOUNTER — Encounter: Payer: Self-pay | Admitting: Primary Care

## 2019-05-27 ENCOUNTER — Other Ambulatory Visit: Payer: Self-pay

## 2019-05-27 VITALS — BP 134/86 | HR 71 | Temp 98.2°F | Ht 64.0 in | Wt 209.5 lb

## 2019-05-27 DIAGNOSIS — G629 Polyneuropathy, unspecified: Secondary | ICD-10-CM

## 2019-05-27 DIAGNOSIS — E119 Type 2 diabetes mellitus without complications: Secondary | ICD-10-CM

## 2019-05-27 DIAGNOSIS — Z794 Long term (current) use of insulin: Secondary | ICD-10-CM

## 2019-05-27 DIAGNOSIS — E781 Pure hyperglyceridemia: Secondary | ICD-10-CM

## 2019-05-27 LAB — HEPATIC FUNCTION PANEL
ALT: 11 U/L (ref 0–35)
AST: 8 U/L (ref 0–37)
Albumin: 4.3 g/dL (ref 3.5–5.2)
Alkaline Phosphatase: 55 U/L (ref 39–117)
Bilirubin, Direct: 0 mg/dL (ref 0.0–0.3)
Total Bilirubin: 0.4 mg/dL (ref 0.2–1.2)
Total Protein: 7.2 g/dL (ref 6.0–8.3)

## 2019-05-27 LAB — LIPID PANEL
Cholesterol: 208 mg/dL — ABNORMAL HIGH (ref 0–200)
HDL: 38.6 mg/dL — ABNORMAL LOW (ref >39.00)
Total CHOL/HDL Ratio: 5
Triglycerides: 1038 mg/dL — ABNORMAL HIGH (ref 0.0–149.0)

## 2019-05-27 LAB — LDL CHOLESTEROL, DIRECT: Direct LDL: 64 mg/dL

## 2019-05-27 MED ORDER — INSULIN GLARGINE 100 UNIT/ML SOLOSTAR PEN: 60.0000 [IU] | PEN_INJECTOR | Freq: Every day | SUBCUTANEOUS | 5 refills | Status: DC

## 2019-05-27 NOTE — Patient Instructions (Signed)
Continue Lantus 60 units at bedtime.  Increase Novolin R to 22 units before meals.  Stop by the lab prior to leaving today. I will notify you of your results once received.   Please schedule a follow up appointment in 1 month.  It was a pleasure to see you today!

## 2019-05-27 NOTE — Progress Notes (Signed)
Subjective:    Patient ID: [name], female    DOB: 07/09/1981, 38 y.o.   MRN: [id number]  HPI  [name] is a 38 year old female who presents today for follow up of medical conditions.  1) Type 2 Diabetes:  Current medications include: Lantus 60 units HS, Novlin R 20 units TID, Metformin 1000 mg BID. She is also doing much better on gabapentin for her neuropathy.   She is checking her blood glucose 2 times daily and is getting readings of:  AM fasting: mid 100's-mid 200's Before Dinner: mid 200's  Highest reading: 311 Lowest reading: 137  Last A1C: 10.6 in late April 2020 Last Eye Exam: Scheduled for July 2020 Last Foot Exam: UTD Pneumonia Vaccination: Completed in 2020 ACE/ARB: ACE Statin:  Atorvastatin   2) Hypertriglyceridemia: Currently managed on atorvastatin 20 mg and fenofibrate 145 mg. Previously managed on gemfibrozil but this was discontinued due to uncontrolled lipids.   She thinks she's doing much better on this regimen as the "bumps" on her arms have improved. She is compliant to her regimen, denies myalgias.   BP Readings from Last 3 Encounters:  05/27/19 134/86  04/25/19 122/80  03/27/19 132/84    Review of Systems  Respiratory: Negative for shortness of breath.   Cardiovascular: Negative for chest pain.  Musculoskeletal: Negative for arthralgias and myalgias.  Neurological:       Numbness/pain to feet improving with gabapentin.        Past Medical History:  Diagnosis Date  . Diabetes mellitus   . Hypercholesteremia   . Hypertension   . Pancreatitis      Social History   Socioeconomic History  . Marital status: Single    Spouse name: Not on file  . Number of children: Not on file  . Years of education: Not on file  . Highest education level: Not on file  Occupational History  . Not on file  Social Needs  . Financial resource strain: Not on file  . Food insecurity    Worry: Not on file    Inability: Not on file  .  Transportation needs    Medical: Not on file    Non-medical: Not on file  Tobacco Use  . Smoking status: Former Games developermoker  . Smokeless tobacco: Never Used  Substance and Sexual Activity  . Alcohol use: No  . Drug use: No  . Sexual activity: Never  Lifestyle  . Physical activity    Days per week: Not on file    Minutes per session: Not on file  . Stress: Not on file  Relationships  . Social Musicianconnections    Talks on phone: Not on file    Gets together: Not on file    Attends religious service: Not on file    Active member of club or organization: Not on file    Attends meetings of clubs or organizations: Not on file    Relationship status: Not on file  . Intimate partner violence    Fear of current or ex partner: Not on file    Emotionally abused: Not on file    Physically abused: Not on file    Forced sexual activity: Not on file  Other Topics Concern  . Not on file  Social History Narrative  . Not on file    Past Surgical History:  Procedure Laterality Date  . KNEE SURGERY      Family History  Problem Relation Age of Onset  . Cancer Other   .  Diabetes Other   . Hypertension Other   . Cancer Paternal Grandfather   . Depression Mother   . Diabetes Father   . Hypertension Father   . Depression Brother     No Known Allergies  Current Outpatient Medications on File Prior to Visit  Medication Sig Dispense Refill  . atorvastatin (LIPITOR) 20 MG tablet Take 1 tablet (20 mg total) by mouth daily. For cholesterol. 90 tablet 0  . fenofibrate (TRICOR) 145 MG tablet Take 1 tablet (145 mg total) by mouth daily. For triglycerides. 90 tablet 0  . gabapentin (NEURONTIN) 300 MG capsule Take 1 capsule (300 mg total) by mouth at bedtime. For neuropathy. 90 capsule 0  . insulin regular (NOVOLIN R) 100 units/mL injection Inject 0.2 mLs (20 Units total) into the skin 3 (three) times daily before meals. 10 mL 11  . lisinopril (ZESTRIL) 5 MG tablet Take 1 tablet (5 mg total) by mouth  daily. For blood pressure and kidney protection. 90 tablet 1  . metFORMIN (GLUCOPHAGE) 1000 MG tablet Take 1 tablet (1,000 mg total) by mouth 2 (two) times daily with a meal. 180 tablet 1   No current facility-administered medications on file prior to visit.     BP 134/86   Pulse 71   Temp 98.2 F (36.8 C) (Oral)   Ht 5\' 4"  (1.626 m)   Wt 209 lb 8 oz (95 kg)   LMP 05/26/2019   SpO2 98%   BMI 35.96 kg/m    Objective:   Physical Exam  Constitutional: She appears well-nourished.  Neck: Neck supple.  Cardiovascular: Normal rate and regular rhythm.  Respiratory: Effort normal and breath sounds normal.  Skin: Skin is warm and dry.  Psychiatric: She has a normal mood and affect.           Assessment & Plan:

## 2019-05-27 NOTE — Assessment & Plan Note (Signed)
Glucose readings have improved and are headed in the right direction. Continue Lantus at 60 units, increase Novolin R to 22 units BID-TID with meals.   Eye exam scheduled. Foot exam UTD. Managed on statin and ACE. Repeat A1C due in late July 2020. Follow up in 1 month.

## 2019-05-27 NOTE — Assessment & Plan Note (Signed)
Much improved on course of gabapentin, continue same.

## 2019-05-27 NOTE — Assessment & Plan Note (Signed)
Repeat lipids and LFT's pending. Continue atorvastatin and fenofibrate.

## 2019-06-28 ENCOUNTER — Ambulatory Visit: Payer: BC Managed Care – PPO | Admitting: Primary Care

## 2019-07-11 ENCOUNTER — Other Ambulatory Visit: Payer: BC Managed Care – PPO

## 2019-07-11 ENCOUNTER — Encounter: Payer: Self-pay | Admitting: Primary Care

## 2019-07-11 ENCOUNTER — Ambulatory Visit (INDEPENDENT_AMBULATORY_CARE_PROVIDER_SITE_OTHER): Payer: BC Managed Care – PPO | Admitting: Primary Care

## 2019-07-11 ENCOUNTER — Other Ambulatory Visit: Payer: Self-pay

## 2019-07-11 VITALS — BP 130/80 | HR 75 | Temp 97.8°F | Ht 64.0 in | Wt 210.5 lb

## 2019-07-11 DIAGNOSIS — Z794 Long term (current) use of insulin: Secondary | ICD-10-CM | POA: Diagnosis not present

## 2019-07-11 DIAGNOSIS — I10 Essential (primary) hypertension: Secondary | ICD-10-CM

## 2019-07-11 DIAGNOSIS — E119 Type 2 diabetes mellitus without complications: Secondary | ICD-10-CM | POA: Diagnosis not present

## 2019-07-11 DIAGNOSIS — Z8719 Personal history of other diseases of the digestive system: Secondary | ICD-10-CM | POA: Diagnosis not present

## 2019-07-11 DIAGNOSIS — R5383 Other fatigue: Secondary | ICD-10-CM | POA: Diagnosis not present

## 2019-07-11 DIAGNOSIS — E781 Pure hyperglyceridemia: Secondary | ICD-10-CM | POA: Diagnosis not present

## 2019-07-11 NOTE — Assessment & Plan Note (Signed)
Likely secondary to uncontrolled glucose readings. Check CBC, BMP, and lipase given history.

## 2019-07-11 NOTE — Patient Instructions (Signed)
Stop by the lab prior to leaving today. I will notify you of your results once received.   It is important that you improve your diet. Please limit carbohydrates in the form of white bread, rice, pasta, sweets, fast food, fried food, sugary drinks, etc. Increase your consumption of fresh fruits and vegetables, whole grains, lean protein.  Ensure you are consuming 64 ounces of water daily.  Start exercising. You should be getting 150 minutes of moderate intensity exercise weekly.  Please schedule a follow up appointment in 1 month for diabetes check.  It was a pleasure to see you today!   Diabetes Mellitus and Nutrition, Adult When you have diabetes (diabetes mellitus), it is very important to have healthy eating habits because your blood sugar (glucose) levels are greatly affected by what you eat and drink. Eating healthy foods in the appropriate amounts, at about the same times every day, can help you:  Control your blood glucose.  Lower your risk of heart disease.  Improve your blood pressure.  Reach or maintain a healthy weight. Every person with diabetes is different, and each person has different needs for a meal plan. Your health care provider may recommend that you work with a diet and nutrition specialist (dietitian) to make a meal plan that is best for you. Your meal plan may vary depending on factors such as:  The calories you need.  The medicines you take.  Your weight.  Your blood glucose, blood pressure, and cholesterol levels.  Your activity level.  Other health conditions you have, such as heart or kidney disease. How do carbohydrates affect me? Carbohydrates, also called carbs, affect your blood glucose level more than any other type of food. Eating carbs naturally raises the amount of glucose in your blood. Carb counting is a method for keeping track of how many carbs you eat. Counting carbs is important to keep your blood glucose at a healthy level, especially  if you use insulin or take certain oral diabetes medicines. It is important to know how many carbs you can safely have in each meal. This is different for every person. Your dietitian can help you calculate how many carbs you should have at each meal and for each snack. Foods that contain carbs include:  Bread, cereal, rice, pasta, and crackers.  Potatoes and corn.  Peas, beans, and lentils.  Milk and yogurt.  Fruit and juice.  Desserts, such as cakes, cookies, ice cream, and candy. How does alcohol affect me? Alcohol can cause a sudden decrease in blood glucose (hypoglycemia), especially if you use insulin or take certain oral diabetes medicines. Hypoglycemia can be a life-threatening condition. Symptoms of hypoglycemia (sleepiness, dizziness, and confusion) are similar to symptoms of having too much alcohol. If your health care provider says that alcohol is safe for you, follow these guidelines:  Limit alcohol intake to no more than 1 drink per day for nonpregnant women and 2 drinks per day for men. One drink equals 12 oz of beer, 5 oz of wine, or 1 oz of hard liquor.  Do not drink on an empty stomach.  Keep yourself hydrated with water, diet soda, or unsweetened iced tea.  Keep in mind that regular soda, juice, and other mixers may contain a lot of sugar and must be counted as carbs. What are tips for following this plan?  Reading food labels  Start by checking the serving size on the "Nutrition Facts" label of packaged foods and drinks. The amount of calories, carbs,  fats, and other nutrients listed on the label is based on one serving of the item. Many items contain more than one serving per package.  Check the total grams (g) of carbs in one serving. You can calculate the number of servings of carbs in one serving by dividing the total carbs by 15. For example, if a food has 30 g of total carbs, it would be equal to 2 servings of carbs.  Check the number of grams (g) of  saturated and trans fats in one serving. Choose foods that have low or no amount of these fats.  Check the number of milligrams (mg) of salt (sodium) in one serving. Most people should limit total sodium intake to less than 2,300 mg per day.  Always check the nutrition information of foods labeled as "low-fat" or "nonfat". These foods may be higher in added sugar or refined carbs and should be avoided.  Talk to your dietitian to identify your daily goals for nutrients listed on the label. Shopping  Avoid buying canned, premade, or processed foods. These foods tend to be high in fat, sodium, and added sugar.  Shop around the outside edge of the grocery store. This includes fresh fruits and vegetables, bulk grains, fresh meats, and fresh dairy. Cooking  Use low-heat cooking methods, such as baking, instead of high-heat cooking methods like deep frying.  Cook using healthy oils, such as olive, canola, or sunflower oil.  Avoid cooking with butter, cream, or high-fat meats. Meal planning  Eat meals and snacks regularly, preferably at the same times every day. Avoid going long periods of time without eating.  Eat foods high in fiber, such as fresh fruits, vegetables, beans, and whole grains. Talk to your dietitian about how many servings of carbs you can eat at each meal.  Eat 4-6 ounces (oz) of lean protein each day, such as lean meat, chicken, fish, eggs, or tofu. One oz of lean protein is equal to: ? 1 oz of meat, chicken, or fish. ? 1 egg. ?  cup of tofu.  Eat some foods each day that contain healthy fats, such as avocado, nuts, seeds, and fish. Lifestyle  Check your blood glucose regularly.  Exercise regularly as told by your health care provider. This may include: ? 150 minutes of moderate-intensity or vigorous-intensity exercise each week. This could be brisk walking, biking, or water aerobics. ? Stretching and doing strength exercises, such as yoga or weightlifting, at least 2  times a week.  Take medicines as told by your health care provider.  Do not use any products that contain nicotine or tobacco, such as cigarettes and e-cigarettes. If you need help quitting, ask your health care provider.  Work with a Veterinary surgeoncounselor or diabetes educator to identify strategies to manage stress and any emotional and social challenges. Questions to ask a health care provider  Do I need to meet with a diabetes educator?  Do I need to meet with a dietitian?  What number can I call if I have questions?  When are the best times to check my blood glucose? Where to find more information:  American Diabetes Association: diabetes.org  Academy of Nutrition and Dietetics: www.eatright.[employer]org  National Institute of Diabetes and Digestive and Kidney Diseases (NIH): Car[employer].tnwww.niddk.nih.gov Summary  A healthy meal plan will help you control your blood glucose and maintain a healthy lifestyle.  Working with a diet and nutrition specialist (dietitian) can help you make a meal plan that is best for you.  Keep in  mind that carbohydrates (carbs) and alcohol have immediate effects on your blood glucose levels. It is important to count carbs and to use alcohol carefully. This information is not intended to replace advice given to you by your health care provider. Make sure you discuss any questions you have with your health care provider. Document Released: 08/11/2005 Document Revised: 10/27/2017 Document Reviewed: 12/19/2016 Elsevier Patient Education  2020 [employer].

## 2019-07-11 NOTE — Assessment & Plan Note (Signed)
Suspect glucose readings are uncontrolled given her poor diet and increased stress levels. She is not checking glucose levels.  We had a very frank conversation regarding the absolute need to check glucose levels when on insulin. Glucose log papers provided for her to use.   Repeat A1C pending. Continue Lantus 60 units daily, will likely continue 28 units of Novolog with meals for now.  Await labs.  Follow up in 1 month.

## 2019-07-11 NOTE — Progress Notes (Signed)
Subjective:    Patient ID: [name] CadetBrandy Nguyen, female    DOB: 07/11/1981, 38 y.o.   MRN: [id number]  HPI  [name] is a 38 year old female who presents today for follow up of diabetes.  Current medications include: Lantus 60 units, Novolin R 22 units with meals.  Recently she's been injecting 28 units of Novolin due to her poor diet.  She's been under a tremendous amount of stress with work, increased demands/hours, hardly has any time off. Feeling more fatigued/tired, headaches, nausea/GI upset. She denies chest pain, dizziness, feeling jittery.  She is checking her blood glucose 0 times daily.   Last A1C: 10.6 in late April 2020,  Last Eye Exam: No recent exam, plans on scheduling next year. Last Foot Exam: Due in May 2021 Pneumonia Vaccination: Completed in 2020 ACE/ARB: lisinopril Statin: atorvastatin   Diet currently consists of:  Breakfast: Skips Lunch: Left overs from dinner (pasta, potatoes, burgers, fast food, take out food). Dinner: Soup mostly, also see above. Snacks: None Desserts: Infrequently  Beverages: Mostly water, occasional diet soda  Exercise: She is not exercising, active at work  BP Readings from Last 3 Encounters:  07/11/19 130/80  05/27/19 134/86  04/25/19 122/80     Review of Systems  Constitutional: Positive for fatigue.  Gastrointestinal: Positive for nausea.  Neurological: Positive for headaches. Negative for dizziness.       Past Medical History:  Diagnosis Date  . Diabetes mellitus   . Hypercholesteremia   . Hypertension   . Pancreatitis      Social History   Socioeconomic History  . Marital status: Single    Spouse name: Not on file  . Number of children: Not on file  . Years of education: Not on file  . Highest education level: Not on file  Occupational History  . Not on file  Social Needs  . Financial resource strain: Not on file  . Food insecurity    Worry: Not on file    Inability: Not on file  . Transportation  needs    Medical: Not on file    Non-medical: Not on file  Tobacco Use  . Smoking status: Former Games developermoker  . Smokeless tobacco: Never Used  Substance and Sexual Activity  . Alcohol use: No  . Drug use: No  . Sexual activity: Never  Lifestyle  . Physical activity    Days per week: Not on file    Minutes per session: Not on file  . Stress: Not on file  Relationships  . Social Musicianconnections    Talks on phone: Not on file    Gets together: Not on file    Attends religious service: Not on file    Active member of club or organization: Not on file    Attends meetings of clubs or organizations: Not on file    Relationship status: Not on file  . Intimate partner violence    Fear of current or ex partner: Not on file    Emotionally abused: Not on file    Physically abused: Not on file    Forced sexual activity: Not on file  Other Topics Concern  . Not on file  Social History Narrative  . Not on file    Past Surgical History:  Procedure Laterality Date  . KNEE SURGERY      Family History  Problem Relation Age of Onset  . Cancer Other   . Diabetes Other   . Hypertension Other   . Cancer  Paternal Grandfather   . Depression Mother   . Diabetes Father   . Hypertension Father   . Depression Brother     No Known Allergies  Current Outpatient Medications on File Prior to Visit  Medication Sig Dispense Refill  . atorvastatin (LIPITOR) 20 MG tablet Take 1 tablet (20 mg total) by mouth daily. For cholesterol. 90 tablet 0  . fenofibrate (TRICOR) 145 MG tablet Take 1 tablet (145 mg total) by mouth daily. For triglycerides. 90 tablet 0  . gabapentin (NEURONTIN) 300 MG capsule Take 1 capsule (300 mg total) by mouth at bedtime. For neuropathy. 90 capsule 0  . Insulin Glargine (LANTUS) 100 UNIT/ML Solostar Pen Inject 60 Units into the skin at bedtime. 15 mL 5  . insulin regular (NOVOLIN R) 100 units/mL injection Inject 0.2 mLs (20 Units total) into the skin 3 (three) times daily before  meals. 10 mL 11  . lisinopril (ZESTRIL) 5 MG tablet Take 1 tablet (5 mg total) by mouth daily. For blood pressure and kidney protection. 90 tablet 1  . metFORMIN (GLUCOPHAGE) 1000 MG tablet Take 1 tablet (1,000 mg total) by mouth 2 (two) times daily with a meal. 180 tablet 1   No current facility-administered medications on file prior to visit.     BP 130/80   Pulse 75   Temp 97.8 F (36.6 C) (Temporal)   Ht 5\' 4"  (1.626 m)   Wt 210 lb 8 oz (95.5 kg)   LMP 07/11/2019   SpO2 95%   BMI 36.13 kg/m    Objective:   Physical Exam  Constitutional: She appears well-nourished.  Neck: Neck supple.  Cardiovascular: Normal rate and regular rhythm.  Respiratory: Effort normal and breath sounds normal.  Skin: Skin is warm and dry.  Psychiatric: She has a normal mood and affect.  Appears stress from work           Assessment & Plan:

## 2019-07-13 LAB — LIPASE: Lipase: 35 U/L (ref 7–60)

## 2019-07-13 LAB — CBC WITH DIFFERENTIAL/PLATELET
Absolute Monocytes: 252 cells/uL (ref 200–950)
Basophils Absolute: 41 cells/uL (ref 0–200)
Basophils Relative: 0.6 %
Eosinophils Absolute: 88 cells/uL (ref 15–500)
Eosinophils Relative: 1.3 %
HCT: 40 % (ref 35.0–45.0)
Hemoglobin: 13.9 g/dL (ref 11.7–15.5)
Lymphs Abs: 2115 cells/uL (ref 850–3900)
MCH: 29.9 pg (ref 27.0–33.0)
MCHC: 34.8 g/dL (ref 32.0–36.0)
MCV: 86 fL (ref 80.0–100.0)
MPV: 9.9 fL (ref 7.5–12.5)
Monocytes Relative: 3.7 %
Neutro Abs: 4304 cells/uL (ref 1500–7800)
Neutrophils Relative %: 63.3 %
Platelets: 437 10*3/uL — ABNORMAL HIGH (ref 140–400)
RBC: 4.65 10*6/uL (ref 3.80–5.10)
RDW: 13.6 % (ref 11.0–15.0)
Total Lymphocyte: 31.1 %
WBC: 6.8 10*3/uL (ref 3.8–10.8)

## 2019-07-13 LAB — BASIC METABOLIC PANEL
BUN: 12 mg/dL (ref 7–25)
CO2: 13 mmol/L — ABNORMAL LOW (ref 20–32)
Calcium: 8.8 mg/dL (ref 8.6–10.2)
Chloride: 109 mmol/L (ref 98–110)
Creat: 0.54 mg/dL (ref 0.50–1.10)
Glucose, Bld: 244 mg/dL — ABNORMAL HIGH (ref 65–99)
Potassium: 4.5 mmol/L (ref 3.5–5.3)
Sodium: 145 mmol/L (ref 135–146)

## 2019-07-13 LAB — HEMOGLOBIN A1C
Hgb A1c MFr Bld: 7.6 % of total Hgb — ABNORMAL HIGH (ref <5.7)
Mean Plasma Glucose: 171 (calc)
eAG (mmol/L): 9.5 (calc)

## 2019-08-08 ENCOUNTER — Other Ambulatory Visit: Payer: Self-pay | Admitting: Primary Care

## 2019-08-08 DIAGNOSIS — I10 Essential (primary) hypertension: Secondary | ICD-10-CM

## 2019-08-08 DIAGNOSIS — E781 Pure hyperglyceridemia: Secondary | ICD-10-CM

## 2019-08-08 DIAGNOSIS — E119 Type 2 diabetes mellitus without complications: Secondary | ICD-10-CM

## 2019-08-08 DIAGNOSIS — Z794 Long term (current) use of insulin: Secondary | ICD-10-CM

## 2019-08-08 DIAGNOSIS — G629 Polyneuropathy, unspecified: Secondary | ICD-10-CM

## 2019-08-08 MED ORDER — LISINOPRIL 5 MG PO TABS: 5.0000 mg | ORAL_TABLET | Freq: Every day | ORAL | 1 refills | Status: DC

## 2019-08-08 MED ORDER — GABAPENTIN 300 MG PO CAPS: 300.0000 mg | ORAL_CAPSULE | Freq: Every day | ORAL | 0 refills | Status: DC

## 2019-08-08 MED ORDER — METFORMIN HCL 1000 MG PO TABS: 1000.0000 mg | ORAL_TABLET | Freq: Two times a day (BID) | ORAL | 1 refills | Status: DC

## 2019-08-08 MED ORDER — ATORVASTATIN CALCIUM 20 MG PO TABS: 20.0000 mg | ORAL_TABLET | Freq: Every day | ORAL | 1 refills | Status: DC

## 2019-08-13 ENCOUNTER — Other Ambulatory Visit: Payer: Self-pay

## 2019-08-13 ENCOUNTER — Ambulatory Visit (INDEPENDENT_AMBULATORY_CARE_PROVIDER_SITE_OTHER): Payer: BC Managed Care – PPO | Admitting: Primary Care

## 2019-08-13 ENCOUNTER — Encounter: Payer: Self-pay | Admitting: Primary Care

## 2019-08-13 VITALS — BP 136/86 | HR 85 | Temp 97.7°F | Ht 64.0 in | Wt 210.2 lb

## 2019-08-13 DIAGNOSIS — Z794 Long term (current) use of insulin: Secondary | ICD-10-CM | POA: Diagnosis not present

## 2019-08-13 DIAGNOSIS — F4323 Adjustment disorder with mixed anxiety and depressed mood: Secondary | ICD-10-CM | POA: Diagnosis not present

## 2019-08-13 DIAGNOSIS — E119 Type 2 diabetes mellitus without complications: Secondary | ICD-10-CM | POA: Diagnosis not present

## 2019-08-13 DIAGNOSIS — Z23 Encounter for immunization: Secondary | ICD-10-CM | POA: Diagnosis not present

## 2019-08-13 MED ORDER — FLUOXETINE HCL 20 MG PO TABS: 20.0000 mg | ORAL_TABLET | Freq: Every day | ORAL | 1 refills | Status: DC

## 2019-08-13 MED ORDER — INSULIN REGULAR HUMAN 100 UNIT/ML IJ SOLN: 28.0000 [IU] | Freq: Three times a day (TID) | INTRAMUSCULAR | 5 refills | Status: AC

## 2019-08-13 NOTE — Progress Notes (Signed)
Subjective:    Patient ID: [name], female    DOB: 07/22/1981, 38 y.o.   MRN: [id number]  HPI  [name] is a 38 year old female who presents today for follow up of diabetes and a chief complaint of depression/anxiety.  1) Type 2 Diabetes: Current medications include: Lantus 60 units, Novolin R 28 units with meals, metformin 1000 mg.   She is checking her blood glucose 2 times daily and is getting readings of:  Before eating at 12 am: high 100's to mid 200's 2 hours after dinner: mid 100's to high 200's  Last A1C: 7.6 in August 2020 reduced from 10.6 in April 2020. Last Eye Exam: No recent exam, will schedule for 2021 Last Foot Exam: Due in May 2021 Pneumonia Vaccination: Completed in 2020 ACE/ARB: Lisinopril  Statin: atorvastatin   Diet currently consists of:  Breakfast: Skips Lunch: Beef jerky, salad, cheese, snacks Dinner: Pasta, hamburgers, wings, soups, steaks, some take out Snacks: None Desserts: Several days weekly Beverages: Water mainly, diet soda  Exercise: She is not exercising  2) Depression/Anxiety: Symptoms of easily anxious/stressed and cannot handle stressful situations, feeling sad/down, tearfulness, irritability. Symptoms began about 3-4 months ago since going back to work. Work is very stressful and is working 12 days on and 2 days off. PHQ 9 score of 12 and GAD 7 score of 11 today. Denies SI/HI. She's never been treated for anxiety and depression as an adult, was treated as a teenager for about 6 months.   Review of Systems  Eyes: Negative for visual disturbance.  Respiratory: Negative for shortness of breath.   Cardiovascular: Negative for chest pain.  Neurological: Negative for dizziness.  Psychiatric/Behavioral: Negative for sleep disturbance. The patient is nervous/anxious.        See HPI       Past Medical History:  Diagnosis Date  . Diabetes mellitus   . Hypercholesteremia   . Hypertension   . Pancreatitis      Social History    Socioeconomic History  . Marital status: Single    Spouse name: Not on file  . Number of children: Not on file  . Years of education: Not on file  . Highest education level: Not on file  Occupational History  . Not on file  Social Needs  . Financial resource strain: Not on file  . Food insecurity    Worry: Not on file    Inability: Not on file  . Transportation needs    Medical: Not on file    Non-medical: Not on file  Tobacco Use  . Smoking status: Former Research scientist (life sciences)  . Smokeless tobacco: Never Used  Substance and Sexual Activity  . Alcohol use: No  . Drug use: No  . Sexual activity: Never  Lifestyle  . Physical activity    Days per week: Not on file    Minutes per session: Not on file  . Stress: Not on file  Relationships  . Social Herbalist on phone: Not on file    Gets together: Not on file    Attends religious service: Not on file    Active member of club or organization: Not on file    Attends meetings of clubs or organizations: Not on file    Relationship status: Not on file  . Intimate partner violence    Fear of current or ex partner: Not on file    Emotionally abused: Not on file    Physically abused: Not on  file    Forced sexual activity: Not on file  Other Topics Concern  . Not on file  Social History Narrative  . Not on file    Past Surgical History:  Procedure Laterality Date  . KNEE SURGERY      Family History  Problem Relation Age of Onset  . Cancer Other   . Diabetes Other   . Hypertension Other   . Cancer Paternal Grandfather   . Depression Mother   . Diabetes Father   . Hypertension Father   . Depression Brother     No Known Allergies  Current Outpatient Medications on File Prior to Visit  Medication Sig Dispense Refill  . atorvastatin (LIPITOR) 20 MG tablet Take 1 tablet (20 mg total) by mouth daily. For cholesterol. 90 tablet 1  . fenofibrate (TRICOR) 145 MG tablet Take 1 tablet (145 mg total) by mouth daily. For  triglycerides. 90 tablet 0  . gabapentin (NEURONTIN) 300 MG capsule Take 1 capsule (300 mg total) by mouth at bedtime. For neuropathy. 90 capsule 0  . Insulin Glargine (LANTUS) 100 UNIT/ML Solostar Pen Inject 60 Units into the skin at bedtime. 15 mL 5  . lisinopril (ZESTRIL) 5 MG tablet Take 1 tablet (5 mg total) by mouth daily. For blood pressure and kidney protection. 90 tablet 1  . metFORMIN (GLUCOPHAGE) 1000 MG tablet Take 1 tablet (1,000 mg total) by mouth 2 (two) times daily with a meal. 180 tablet 1   No current facility-administered medications on file prior to visit.     BP 136/86   Pulse 85   Temp 97.7 F (36.5 C) (Temporal)   Ht 5\' 4"  (1.626 m)   Wt 210 lb 4 oz (95.4 kg)   LMP 08/11/2019   SpO2 98%   BMI 36.09 kg/m    Objective:   Physical Exam  Constitutional: She appears well-nourished.  Neck: Neck supple.  Cardiovascular: Normal rate and regular rhythm.  Respiratory: Effort normal and breath sounds normal.  Skin: Skin is warm and dry.  Psychiatric: She has a normal mood and affect.           Assessment & Plan:

## 2019-08-13 NOTE — Assessment & Plan Note (Signed)
Chronic for 3-4 months, seems to be occupational related given long stretch of work days in a row with limited time off.  PHQ 9 score of 12 and GAD 7 score of 11 today, denies SI/HI. Discussed options for treatment, she opts for medication. Rx for fluoxetine 20 mg sent to pharmacy.   Patient is to take 1/2 tablet daily for 6 days, then advance to 1 full tablet thereafter. We discussed possible side effects of headache, GI upset, drowsiness, and SI/HI. If thoughts of SI/HI develop, we discussed to present to the emergency immediately. Patient verbalized understanding.   Follow up in 6 weeks for re-evaluation.

## 2019-08-13 NOTE — Patient Instructions (Signed)
Start fluoxetine 20 mg tablets for depression. Start by taking 1/2 tablet daily for 6 days, then increase to 1 full tablet thereafter.  You can increase your Novolin to 30 units before meals if you notice a spike in your blood sugar, otherwise continue 28 units.  Continue Lantus 60 units daily and metformin.  Continue to check your blood sugars, increase to 3 times daily before meals.   Schedule a follow up visit with me in 6 weeks for re-evaluation of depression/anxiety.   It was a pleasure to see you today!

## 2019-08-13 NOTE — Assessment & Plan Note (Signed)
She is now checking glucose levels, mostly at the end of her waking day which is 12 am and 2 am. Discussed to check glucose readings at least three times daily before meals.  Continue Lantus at 60 units, metformin, and Novolin 28 units TID with meals.  Follow up in 2-5 months.

## 2019-08-13 NOTE — Addendum Note (Signed)
Addended by: Jacqualin Combes on: 08/13/2019 09:56 AM   Modules accepted: Orders

## 2019-09-24 ENCOUNTER — Ambulatory Visit: Payer: BC Managed Care – PPO | Admitting: Primary Care

## 2020-01-12 ENCOUNTER — Other Ambulatory Visit: Payer: Self-pay

## 2020-01-12 ENCOUNTER — Encounter (HOSPITAL_COMMUNITY): Payer: Self-pay | Admitting: Emergency Medicine

## 2020-01-12 ENCOUNTER — Emergency Department (HOSPITAL_COMMUNITY)
Admission: EM | Admit: 2020-01-12 | Discharge: 2020-01-12 | Disposition: A | Discharge status: Home / Self Care | Payer: BC Managed Care – PPO | Attending: Emergency Medicine | Admitting: Emergency Medicine

## 2020-01-12 DIAGNOSIS — E119 Type 2 diabetes mellitus without complications: Secondary | ICD-10-CM | POA: Insufficient documentation

## 2020-01-12 DIAGNOSIS — M5432 Sciatica, left side: Secondary | ICD-10-CM | POA: Insufficient documentation

## 2020-01-12 DIAGNOSIS — Z79899 Other long term (current) drug therapy: Secondary | ICD-10-CM | POA: Insufficient documentation

## 2020-01-12 DIAGNOSIS — I10 Essential (primary) hypertension: Secondary | ICD-10-CM | POA: Insufficient documentation

## 2020-01-12 DIAGNOSIS — Z794 Long term (current) use of insulin: Secondary | ICD-10-CM | POA: Insufficient documentation

## 2020-01-12 DIAGNOSIS — Z87891 Personal history of nicotine dependence: Secondary | ICD-10-CM | POA: Insufficient documentation

## 2020-01-12 LAB — CBG MONITORING, ED: Glucose-Capillary: 325 mg/dL — ABNORMAL HIGH (ref 70–99)

## 2020-01-12 MED ORDER — HYDROCODONE-ACETAMINOPHEN 5-325 MG PO TABS: 1.0000 | ORAL_TABLET | Freq: Four times a day (QID) | ORAL | 0 refills | Status: DC | PRN

## 2020-01-12 MED ORDER — METHOCARBAMOL 500 MG PO TABS: 750.0000 mg | ORAL_TABLET | Freq: Three times a day (TID) | ORAL | 0 refills | Status: DC | PRN

## 2020-01-12 NOTE — Discharge Instructions (Signed)
1.  You have symptoms of sciatica.  This is often treated with steroids and or other anti-inflammatory such as ibuprofen or naproxen.  However, your blood sugars are not well controlled at this time.  Steroids can significantly elevate your blood sugar.  You will have to be seen by your doctor and have adequate monitoring and control of your blood sugars before you can safely start a steroid course.  Also, your blood pressures are elevated.  This may be due to pain but also can be due to poor baseline blood pressure control.  NSAID anti-inflammatory such as ibuprofen and naproxen can make this worse.  Be sure to take your lisinopril as prescribed.  Use a home monitor and keep a journal of your blood pressures 2-3 times daily for the next week.  Follow instructions in your discharge set for diet and other measures for control of blood pressure as well. 2.  You need to be seen by your doctor or get established with a low cost clinic as soon as possible.  A resource guide has been included to help you find low cost medical care.

## 2020-01-12 NOTE — ED Triage Notes (Signed)
Pt c/o left leg pain that started "around New Years". Worsening in the last 2 weeks, and pt reports her left arm developed a  Burning pain in the last 2 weeks as well. 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

## 2020-01-12 NOTE — ED Provider Notes (Signed)
[name] EMERGENCY DEPARTMENT Provider Note   CSN: [id number] Arrival date & time: 01/12/20  1513     History Chief Complaint  Patient presents with  . Leg Pain    [name] is a 39 y.o. female.  HPI Patient reports she has been getting pain in her left leg since New Year's.  The pain is burning.  She reports it is around her buttock and hip on the left and sometimes is down the back of her leg and then sometimes all the way down to the top of her foot.  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  She takes Neurontin for it but it does not seem to be helping with this pain.  She denies she is having any difficulty walking or weakness in the leg.  She reports she does sometimes hold onto things because she is favoring the leg.  No pain in the back.  No difficulty controlling her bladder.  No incontinence.  No difficulty urinating.  No pain burning urgency with urination.  No pain in the groin or the abdomen.  Patient reports that she lost her insurance due to losing her job in November.  She reports she still has all of her diabetes medications, her Neurontin and lisinopril.  She reports she has taken her morning rapid acting insulin.  She took her long-acting last night.  She reports she has not had a afternoon dose.  Patient reports she does not know what her blood sugars have been doing because she ran out of strips and had not purchased anymore yet.  She reports she is having financial stress and is trying to work with her doctor to make sure they can continue to provide care and medications.    Past Medical History:  Diagnosis Date  . Diabetes mellitus   . Hypercholesteremia   . Hypertension   . Pancreatitis     Patient Active Problem List   Diagnosis Date Noted  . Adjustment disorder with mixed anxiety and depressed mood 08/13/2019  . Fatigue 07/11/2019  . Neuropathy 04/25/2019  .  Pancreatitis 03/17/2019  . Type 2 diabetes mellitus (HCC) 10/21/2014  . History of pancreatitis 10/21/2014  . Essential hypertension 10/21/2014  . Hypertriglyceridemia 10/21/2014    Past Surgical History:  Procedure Laterality Date  . KNEE SURGERY       OB History   No obstetric history on file.     Family History  Problem Relation Age of Onset  . Cancer Other   . Diabetes Other   . Hypertension Other   . Cancer Paternal Grandfather   . Depression Mother   . Diabetes Father   . Hypertension Father   . Depression Brother     Social History   Tobacco Use  . Smoking status: Former Games developer  . Smokeless tobacco: Never Used  Substance Use Topics  . Alcohol use: No  . Drug use: No    Home Medications Prior to Admission medications   Medication Sig Start Date End Date Taking? Authorizing Provider  atorvastatin (LIPITOR) 20 MG tablet Take 1 tablet (20 mg total) by mouth daily. For cholesterol. 08/08/19   Doreene Nest, NP  fenofibrate (TRICOR) 145 MG tablet Take 1 tablet (145 mg total) by mouth daily. For triglycerides. 04/26/19 04/25/20  Doreene Nest, NP  FLUoxetine (PROZAC) 20 MG tablet Take 1 tablet (20 mg total) by mouth daily. For depression.  08/13/19   Pleas Koch, NP  gabapentin (NEURONTIN) 300 MG capsule Take 1 capsule (300 mg total) by mouth at bedtime. For neuropathy. 08/08/19   Pleas Koch, NP  HYDROcodone-acetaminophen (NORCO/VICODIN) 5-325 MG tablet Take 1-2 tablets by mouth every 6 (six) hours as needed for moderate pain or severe pain. 01/12/20   Charlesetta Shanks, MD  Insulin Glargine (LANTUS) 100 UNIT/ML Solostar Pen Inject 60 Units into the skin at bedtime. 05/27/19   Pleas Koch, NP  insulin regular (NOVOLIN R) 100 units/mL injection Inject 0.28 mLs (28 Units total) into the skin 3 (three) times daily before meals. 08/13/19   Pleas Koch, NP  lisinopril (ZESTRIL) 5 MG tablet Take 1 tablet (5 mg total) by mouth daily. For blood  pressure and kidney protection. 08/08/19 08/07/20  Pleas Koch, NP  metFORMIN (GLUCOPHAGE) 1000 MG tablet Take 1 tablet (1,000 mg total) by mouth 2 (two) times daily with a meal. 08/08/19   Pleas Koch, NP  methocarbamol (ROBAXIN) 500 MG tablet Take 1.5 tablets (750 mg total) by mouth every 8 (eight) hours as needed for muscle spasms. 01/12/20   Charlesetta Shanks, MD    Allergies    Patient has no known allergies.  Review of Systems   Review of Systems 10 Systems reviewed and are negative for acute change except as noted in the HPI. Physical Exam Updated Vital Signs BP (!) 153/70   Pulse (!) 114   Temp 98.3 F (36.8 C) (Oral)   Resp (!) 22   LMP 01/11/2020   SpO2 95%   Physical Exam Constitutional:      Comments: Patient is alert nontoxic.  Clinically well in appearance.  No distress.  No respiratory distress.  HENT:     Head: Normocephalic and atraumatic.  Eyes:     Extraocular Movements: Extraocular movements intact.     Conjunctiva/sclera: Conjunctivae normal.  Cardiovascular:     Pulses: Normal pulses.     Heart sounds: Normal heart sounds.     Comments: Borderline tachycardia.  No gross rub murmur gallop. Pulmonary:     Effort: Pulmonary effort is normal.     Breath sounds: Normal breath sounds.  Abdominal:     General: There is no distension.     Palpations: Abdomen is soft.     Tenderness: There is no abdominal tenderness. There is no guarding.     Comments: Patient denies any abdominal pain to palpation.  No mass or fullness within the groin or upper leg.  Lymphadenopathy.  Patient is completely nontender to palpation of the vascular bundle, upper medial thigh.  Musculoskeletal:     Comments: Patient has good functional range of motion.  She can stand up out of the stretcher and is easily able to remove her pants independently changing weight on each leg without difficulty.  She can then get back in the stretcher and follow commands for positioning.  No focal  reproducible pain in the back or SI region.  No mass or swelling in the buttock or hip.  Does endorse some tenderness over the lateral aspect of the left hip toward the gluteus and on the lateral thigh.  Soft tissue swelling or erythema.  Normal range of motion at the hip joint.  Pain is not significant reproduced by range of motion of the hip.  Knee without effusion.  Lower leg no peripheral edema.  Calf musculature well formed and symmetric left to right.  Salas pedis pulses are 2+ and symmetric.  Foot is warm and dry.  Sensation intact.  Skin condition good without wounds or cellulitis.  Neurological:     General: No focal deficit present.     Mental Status: She is oriented to person, place, and time.     Sensory: No sensory deficit.     Motor: No weakness.     Coordination: Coordination normal.     Gait: Gait normal.  Psychiatric:        Mood and Affect: Mood normal.     ED Results / Procedures / Treatments   Labs (all labs ordered are listed, but only abnormal results are displayed) Labs Reviewed  CBG MONITORING, ED - Abnormal; Notable for the following components:      Result Value   Glucose-Capillary 325 (*)    All other components within normal limits    EKG None  Radiology No results found.  Procedures Procedures (including critical care time)  Medications Ordered in ED Medications - No data to display  ED Course  I have reviewed the triage vital signs and the nursing notes.  Pertinent labs & imaging results that were available during my care of the patient were reviewed by me and considered in my medical decision making (see chart for details).    MDM Rules/Calculators/A&P                      Findings consistent with sciatica that has been present for about a month and a half.  Patient does not have motor weakness.  Patient ambulation is intact.  Pattern is consistent with pain coming from around the left hip and down the back of the leg and at times to the top  of the foot.  Patient has diabetes but has not been consistently monitoring blood sugar although she reports consistently using her insulin.  This time, with CBG at 325, I do not feel patient can safely start a Medrol Dosepak until blood sugars are controlled and being actively monitored by her PCP.  We will also avoid NSAIDs at this time due to some increased risk of AKI and worsening hypertension in a patient with comorbid diabetes on lisinopril.  Will opt for short course of hydrocodone and Robaxin.  Patient is counseled on the nature of sciatica.  She is working with her PCP to try to make sure she has continuity of medical care.  Resource guide however is also provided so patient can work on finding low cost continuing medical care.  Return precautions reviewed. Final Clinical Impression(s) / ED Diagnoses Final diagnoses:  Sciatica of left side    Rx / DC Orders ED Discharge Orders         Ordered    HYDROcodone-acetaminophen (NORCO/VICODIN) 5-325 MG tablet  Every 6 hours PRN     01/12/20 1632    methocarbamol (ROBAXIN) 500 MG tablet  Every 8 hours PRN     01/12/20 1632           Arby Barrette, MD 01/12/20 1652

## 2020-07-15 ENCOUNTER — Other Ambulatory Visit: Payer: Medicaid Other

## 2020-07-15 ENCOUNTER — Other Ambulatory Visit: Payer: Self-pay | Admitting: Critical Care Medicine

## 2020-07-15 DIAGNOSIS — Z20822 Contact with and (suspected) exposure to covid-19: Secondary | ICD-10-CM

## 2020-07-16 LAB — NOVEL CORONAVIRUS, NAA: SARS-CoV-2, NAA: NOT DETECTED

## 2020-07-16 LAB — SARS-COV-2, NAA 2 DAY TAT

## 2020-09-02 ENCOUNTER — Other Ambulatory Visit: Payer: Self-pay

## 2020-09-02 ENCOUNTER — Inpatient Hospital Stay (HOSPITAL_COMMUNITY)
Admission: EM | Admit: 2020-09-02 | Discharge: 2020-09-10 | DRG: 177 | Disposition: A | Discharge status: Home / Self Care | Payer: HRSA Program | Attending: Internal Medicine | Admitting: Internal Medicine

## 2020-09-02 ENCOUNTER — Emergency Department (HOSPITAL_COMMUNITY): Payer: HRSA Program

## 2020-09-02 ENCOUNTER — Encounter (HOSPITAL_COMMUNITY): Payer: Self-pay | Admitting: Emergency Medicine

## 2020-09-02 DIAGNOSIS — I10 Essential (primary) hypertension: Secondary | ICD-10-CM | POA: Diagnosis present

## 2020-09-02 DIAGNOSIS — J1282 Pneumonia due to coronavirus disease 2019: Secondary | ICD-10-CM | POA: Diagnosis present

## 2020-09-02 DIAGNOSIS — R438 Other disturbances of smell and taste: Secondary | ICD-10-CM | POA: Diagnosis present

## 2020-09-02 DIAGNOSIS — Z794 Long term (current) use of insulin: Secondary | ICD-10-CM | POA: Diagnosis not present

## 2020-09-02 DIAGNOSIS — Z833 Family history of diabetes mellitus: Secondary | ICD-10-CM | POA: Diagnosis not present

## 2020-09-02 DIAGNOSIS — Z818 Family history of other mental and behavioral disorders: Secondary | ICD-10-CM

## 2020-09-02 DIAGNOSIS — N764 Abscess of vulva: Secondary | ICD-10-CM | POA: Diagnosis present

## 2020-09-02 DIAGNOSIS — Z809 Family history of malignant neoplasm, unspecified: Secondary | ICD-10-CM | POA: Diagnosis not present

## 2020-09-02 DIAGNOSIS — R112 Nausea with vomiting, unspecified: Secondary | ICD-10-CM | POA: Diagnosis present

## 2020-09-02 DIAGNOSIS — E785 Hyperlipidemia, unspecified: Secondary | ICD-10-CM | POA: Diagnosis present

## 2020-09-02 DIAGNOSIS — U071 COVID-19: Secondary | ICD-10-CM | POA: Diagnosis not present

## 2020-09-02 DIAGNOSIS — N751 Abscess of Bartholin's gland: Secondary | ICD-10-CM | POA: Diagnosis not present

## 2020-09-02 DIAGNOSIS — R0602 Shortness of breath: Secondary | ICD-10-CM | POA: Diagnosis present

## 2020-09-02 DIAGNOSIS — A0839 Other viral enteritis: Secondary | ICD-10-CM | POA: Diagnosis present

## 2020-09-02 DIAGNOSIS — A419 Sepsis, unspecified organism: Secondary | ICD-10-CM | POA: Diagnosis present

## 2020-09-02 DIAGNOSIS — E1140 Type 2 diabetes mellitus with diabetic neuropathy, unspecified: Secondary | ICD-10-CM | POA: Diagnosis present

## 2020-09-02 DIAGNOSIS — E781 Pure hyperglyceridemia: Secondary | ICD-10-CM | POA: Diagnosis present

## 2020-09-02 DIAGNOSIS — Z23 Encounter for immunization: Secondary | ICD-10-CM | POA: Diagnosis not present

## 2020-09-02 DIAGNOSIS — E7800 Pure hypercholesterolemia, unspecified: Secondary | ICD-10-CM | POA: Diagnosis present

## 2020-09-02 DIAGNOSIS — Z8249 Family history of ischemic heart disease and other diseases of the circulatory system: Secondary | ICD-10-CM

## 2020-09-02 DIAGNOSIS — Z79899 Other long term (current) drug therapy: Secondary | ICD-10-CM

## 2020-09-02 DIAGNOSIS — Z87891 Personal history of nicotine dependence: Secondary | ICD-10-CM

## 2020-09-02 DIAGNOSIS — J9601 Acute respiratory failure with hypoxia: Secondary | ICD-10-CM | POA: Diagnosis present

## 2020-09-02 DIAGNOSIS — N750 Cyst of Bartholin's gland: Secondary | ICD-10-CM | POA: Diagnosis not present

## 2020-09-02 HISTORY — DX: COVID-19: U07.1

## 2020-09-02 LAB — URINALYSIS, ROUTINE W REFLEX MICROSCOPIC
Bilirubin Urine: NEGATIVE
Glucose, UA: 50 mg/dL — AB
Ketones, ur: 5 mg/dL — AB
Leukocytes,Ua: NEGATIVE
Nitrite: NEGATIVE
Protein, ur: 100 mg/dL — AB
Specific Gravity, Urine: 1.019 (ref 1.005–1.030)
pH: 5 (ref 5.0–8.0)

## 2020-09-02 LAB — HEMOGLOBIN A1C
Hgb A1c MFr Bld: 10.9 % — ABNORMAL HIGH (ref 4.8–5.6)
Mean Plasma Glucose: 266.13 mg/dL

## 2020-09-02 LAB — COMPREHENSIVE METABOLIC PANEL
ALT: 30 U/L (ref 0–44)
AST: 63 U/L — ABNORMAL HIGH (ref 15–41)
Albumin: 2.9 g/dL — ABNORMAL LOW (ref 3.5–5.0)
Alkaline Phosphatase: 40 U/L (ref 38–126)
Anion gap: 12 (ref 5–15)
BUN: 17 mg/dL (ref 6–20)
CO2: 22 mmol/L (ref 22–32)
Calcium: 7.9 mg/dL — ABNORMAL LOW (ref 8.9–10.3)
Chloride: 98 mmol/L (ref 98–111)
Creatinine, Ser: 0.62 mg/dL (ref 0.44–1.00)
GFR calc non Af Amer: >60 mL/min (ref >60)
Glucose, Bld: 228 mg/dL — ABNORMAL HIGH (ref 70–99)
Potassium: 3.7 mmol/L (ref 3.5–5.1)
Sodium: 132 mmol/L — ABNORMAL LOW (ref 135–145)
Total Bilirubin: 0.7 mg/dL (ref 0.3–1.2)
Total Protein: 7.1 g/dL (ref 6.5–8.1)

## 2020-09-02 LAB — HIV ANTIBODY (ROUTINE TESTING W REFLEX): HIV Screen 4th Generation wRfx: NONREACTIVE

## 2020-09-02 LAB — CBC
HCT: 37.6 % (ref 36.0–46.0)
Hemoglobin: 12.7 g/dL (ref 12.0–15.0)
MCH: 27.5 pg (ref 26.0–34.0)
MCHC: 33.8 g/dL (ref 30.0–36.0)
MCV: 81.4 fL (ref 80.0–100.0)
Platelets: 208 10*3/uL (ref 150–400)
RBC: 4.62 MIL/uL (ref 3.87–5.11)
RDW: 14.1 % (ref 11.5–15.5)
WBC: 5.9 10*3/uL (ref 4.0–10.5)
nRBC: 0 % (ref 0.0–0.2)

## 2020-09-02 LAB — LACTATE DEHYDROGENASE: LDH: 385 U/L — ABNORMAL HIGH (ref 98–192)

## 2020-09-02 LAB — GLUCOSE, CAPILLARY
Glucose-Capillary: 262 mg/dL — ABNORMAL HIGH (ref 70–99)
Glucose-Capillary: 313 mg/dL — ABNORMAL HIGH (ref 70–99)
Glucose-Capillary: 362 mg/dL — ABNORMAL HIGH (ref 70–99)
Glucose-Capillary: 377 mg/dL — ABNORMAL HIGH (ref 70–99)

## 2020-09-02 LAB — RESPIRATORY PANEL BY RT PCR (FLU A&B, COVID)
Influenza A by PCR: NEGATIVE
Influenza B by PCR: NEGATIVE
SARS Coronavirus 2 by RT PCR: POSITIVE — AB

## 2020-09-02 LAB — LIPASE, BLOOD: Lipase: 39 U/L (ref 11–51)

## 2020-09-02 LAB — C-REACTIVE PROTEIN: CRP: 11.6 mg/dL — ABNORMAL HIGH (ref <1.0)

## 2020-09-02 LAB — FERRITIN: Ferritin: 162 ng/mL (ref 11–307)

## 2020-09-02 LAB — I-STAT BETA HCG BLOOD, ED (MC, WL, AP ONLY): I-stat hCG, quantitative: <5 m[IU]/mL (ref <5)

## 2020-09-02 LAB — D-DIMER, QUANTITATIVE: D-Dimer, Quant: 0.92 ug/mL-FEU — ABNORMAL HIGH (ref 0.00–0.50)

## 2020-09-02 LAB — FIBRINOGEN: Fibrinogen: >800 mg/dL — ABNORMAL HIGH (ref 210–475)

## 2020-09-02 LAB — PROCALCITONIN: Procalcitonin: <0.1 ng/mL

## 2020-09-02 MED ORDER — ONDANSETRON HCL 4 MG/2ML IJ SOLN
4.0000 mg | Freq: Four times a day (QID) | INTRAMUSCULAR | Status: DC | PRN
  Administered 2020-09-02: 4 mg via INTRAVENOUS
  Filled 2020-09-02: qty 2

## 2020-09-02 MED ORDER — LINAGLIPTIN 5 MG PO TABS
5.0000 mg | ORAL_TABLET | Freq: Every day | ORAL | Status: DC
  Administered 2020-09-02 – 2020-09-10 (×9): 5 mg via ORAL
  Filled 2020-09-02 (×9): qty 1

## 2020-09-02 MED ORDER — HYDROCOD POLST-CPM POLST ER 10-8 MG/5ML PO SUER: 5.0000 mL | Freq: Two times a day (BID) | ORAL | Status: DC | PRN

## 2020-09-02 MED ORDER — ASCORBIC ACID 500 MG PO TABS
500.0000 mg | ORAL_TABLET | Freq: Every day | ORAL | Status: DC
  Administered 2020-09-02 – 2020-09-10 (×9): 500 mg via ORAL
  Filled 2020-09-02 (×9): qty 1

## 2020-09-02 MED ORDER — METHYLPREDNISOLONE SODIUM SUCC 125 MG IJ SOLR
0.5000 mg/kg | Freq: Two times a day (BID) | INTRAMUSCULAR | Status: AC
  Administered 2020-09-02 – 2020-09-04 (×6): 47.5 mg via INTRAVENOUS
  Filled 2020-09-02 (×6): qty 2

## 2020-09-02 MED ORDER — PREDNISONE 20 MG PO TABS
50.0000 mg | ORAL_TABLET | Freq: Every day | ORAL | Status: AC
  Administered 2020-09-05 – 2020-09-06 (×2): 50 mg via ORAL
  Filled 2020-09-02 (×2): qty 2

## 2020-09-02 MED ORDER — INSULIN GLARGINE 100 UNIT/ML SOLOSTAR PEN: 40.0000 [IU] | PEN_INJECTOR | Freq: Two times a day (BID) | SUBCUTANEOUS | Status: DC

## 2020-09-02 MED ORDER — ATORVASTATIN CALCIUM 10 MG PO TABS
20.0000 mg | ORAL_TABLET | Freq: Every day | ORAL | Status: DC
  Administered 2020-09-02: 20 mg via ORAL
  Filled 2020-09-02: qty 2

## 2020-09-02 MED ORDER — FENOFIBRATE 160 MG PO TABS
160.0000 mg | ORAL_TABLET | Freq: Every day | ORAL | Status: DC
  Administered 2020-09-02 – 2020-09-10 (×9): 160 mg via ORAL
  Filled 2020-09-02 (×9): qty 1

## 2020-09-02 MED ORDER — INSULIN ASPART 100 UNIT/ML ~~LOC~~ SOLN
0.0000 [IU] | Freq: Every day | SUBCUTANEOUS | Status: DC
  Administered 2020-09-02: 5 [IU] via SUBCUTANEOUS
  Administered 2020-09-03 – 2020-09-05 (×3): 4 [IU] via SUBCUTANEOUS
  Administered 2020-09-06: 3 [IU] via SUBCUTANEOUS
  Administered 2020-09-07 – 2020-09-08 (×2): 2 [IU] via SUBCUTANEOUS

## 2020-09-02 MED ORDER — SODIUM CHLORIDE 0.9 % IV SOLN
100.0000 mg | Freq: Every day | INTRAVENOUS | Status: AC
  Administered 2020-09-03 – 2020-09-06 (×4): 100 mg via INTRAVENOUS
  Filled 2020-09-02 (×4): qty 20

## 2020-09-02 MED ORDER — GUAIFENESIN-DM 100-10 MG/5ML PO SYRP
10.0000 mL | ORAL_SOLUTION | ORAL | Status: DC | PRN
  Administered 2020-09-02: 10 mL via ORAL
  Filled 2020-09-02: qty 10

## 2020-09-02 MED ORDER — ZINC SULFATE 220 (50 ZN) MG PO CAPS
220.0000 mg | ORAL_CAPSULE | Freq: Every day | ORAL | Status: DC
  Administered 2020-09-02 – 2020-09-10 (×9): 220 mg via ORAL
  Filled 2020-09-02 (×9): qty 1

## 2020-09-02 MED ORDER — ALBUTEROL SULFATE HFA 108 (90 BASE) MCG/ACT IN AERS: 2.0000 | INHALATION_SPRAY | RESPIRATORY_TRACT | Status: DC | PRN

## 2020-09-02 MED ORDER — VITAMIN D 25 MCG (1000 UNIT) PO TABS
1000.0000 [IU] | ORAL_TABLET | Freq: Every day | ORAL | Status: DC
  Administered 2020-09-02 – 2020-09-10 (×9): 1000 [IU] via ORAL
  Filled 2020-09-02 (×9): qty 1

## 2020-09-02 MED ORDER — GABAPENTIN 300 MG PO CAPS
300.0000 mg | ORAL_CAPSULE | Freq: Every day | ORAL | Status: DC
  Administered 2020-09-05 – 2020-09-08 (×2): 300 mg via ORAL
  Filled 2020-09-02 (×4): qty 1

## 2020-09-02 MED ORDER — INSULIN ASPART 100 UNIT/ML ~~LOC~~ SOLN
0.0000 [IU] | Freq: Three times a day (TID) | SUBCUTANEOUS | Status: DC
  Administered 2020-09-02 – 2020-09-03 (×3): 15 [IU] via SUBCUTANEOUS
  Administered 2020-09-04 (×2): 11 [IU] via SUBCUTANEOUS
  Administered 2020-09-04: 15 [IU] via SUBCUTANEOUS
  Administered 2020-09-05: 7 [IU] via SUBCUTANEOUS
  Administered 2020-09-05 – 2020-09-06 (×3): 11 [IU] via SUBCUTANEOUS
  Administered 2020-09-07: 7 [IU] via SUBCUTANEOUS
  Administered 2020-09-07: 3 [IU] via SUBCUTANEOUS
  Administered 2020-09-08: 11 [IU] via SUBCUTANEOUS
  Administered 2020-09-08: 3 [IU] via SUBCUTANEOUS
  Administered 2020-09-09: 4 [IU] via SUBCUTANEOUS
  Administered 2020-09-09: 0 [IU] via SUBCUTANEOUS

## 2020-09-02 MED ORDER — SODIUM CHLORIDE 0.9 % IV SOLN
200.0000 mg | Freq: Once | INTRAVENOUS | Status: AC
  Administered 2020-09-02: 200 mg via INTRAVENOUS
  Filled 2020-09-02: qty 40

## 2020-09-02 MED ORDER — PROMETHAZINE HCL 25 MG/ML IJ SOLN: 12.5000 mg | Freq: Four times a day (QID) | INTRAMUSCULAR | Status: DC | PRN

## 2020-09-02 MED ORDER — ACETAMINOPHEN 325 MG PO TABS
650.0000 mg | ORAL_TABLET | Freq: Four times a day (QID) | ORAL | Status: DC | PRN
  Administered 2020-09-03: 650 mg via ORAL
  Filled 2020-09-02 (×2): qty 2

## 2020-09-02 MED ORDER — INSULIN ASPART 100 UNIT/ML ~~LOC~~ SOLN
0.0000 [IU] | Freq: Three times a day (TID) | SUBCUTANEOUS | Status: DC
  Administered 2020-09-02 (×2): 8 [IU] via SUBCUTANEOUS

## 2020-09-02 MED ORDER — INSULIN GLARGINE 100 UNIT/ML ~~LOC~~ SOLN
40.0000 [IU] | Freq: Two times a day (BID) | SUBCUTANEOUS | Status: DC
  Administered 2020-09-02 – 2020-09-03 (×3): 40 [IU] via SUBCUTANEOUS
  Filled 2020-09-02 (×4): qty 0.4

## 2020-09-02 MED ORDER — ENOXAPARIN SODIUM 40 MG/0.4ML ~~LOC~~ SOLN
40.0000 mg | SUBCUTANEOUS | Status: DC
  Administered 2020-09-02 – 2020-09-09 (×8): 40 mg via SUBCUTANEOUS
  Filled 2020-09-02 (×9): qty 0.4

## 2020-09-02 NOTE — ED Triage Notes (Signed)
Pt presents to ED POV. Pt c/o SOB, CP w/ cough, nausea, vomiting. Pt says her symptoms began 2w ago. Pt is unvaccinated for covid. Pt O2 sat 82% on RA placed on 2L [place], >90%

## 2020-09-02 NOTE — ED Provider Notes (Signed)
[facility] EMERGENCY DEPARTMENT Provider Note   CSN: [id number] Arrival date & time: 09/02/20  0120     History Chief Complaint  Patient presents with  . Nausea  . hypoxia    [name] is a 39 y.o. female.  Patient reports that she has been experiencing cough with shortness of breath, nausea and vomiting for nearly 2 weeks.  Symptoms have progressively worsened.        Past Medical History:  Diagnosis Date  . Diabetes mellitus   . Hypercholesteremia   . Hypertension   . Pancreatitis     Patient Active Problem List   Diagnosis Date Noted  . COVID-19 virus infection 09/02/2020  . Adjustment disorder with mixed anxiety and depressed mood 08/13/2019  . Fatigue 07/11/2019  . Neuropathy 04/25/2019  . Pancreatitis 03/17/2019  . Type 2 diabetes mellitus (HCC) 10/21/2014  . History of pancreatitis 10/21/2014  . Essential hypertension 10/21/2014  . Hypertriglyceridemia 10/21/2014    Past Surgical History:  Procedure Laterality Date  . KNEE SURGERY       OB History   No obstetric history on file.     Family History  Problem Relation Age of Onset  . Cancer Other   . Diabetes Other   . Hypertension Other   . Cancer Paternal Grandfather   . Depression Mother   . Diabetes Father   . Hypertension Father   . Depression Brother     Social History   Tobacco Use  . Smoking status: Former Games developermoker  . Smokeless tobacco: Never Used  Substance Use Topics  . Alcohol use: No  . Drug use: No    Home Medications Prior to Admission medications   Medication Sig Start Date End Date Taking? Authorizing Provider  atorvastatin (LIPITOR) 20 MG tablet Take 1 tablet (20 mg total) by mouth daily. For cholesterol. 08/08/19  Yes Doreene Nestlark, Katherine K, NP  fenofibrate (TRICOR) 145 MG tablet Take 1 tablet (145 mg total) by mouth daily. For triglycerides. 04/26/19 09/02/20 Yes Doreene Nestlark, Katherine K, NP  gabapentin (NEURONTIN) 300 MG capsule Take 1 capsule (300 mg  total) by mouth at bedtime. For neuropathy. 08/08/19  Yes Doreene Nestlark, Katherine K, NP  Insulin Glargine (LANTUS) 100 UNIT/ML Solostar Pen Inject 60 Units into the skin at bedtime. Patient taking differently: Inject 40 Units into the skin 2 (two) times daily. Am, and pm 05/27/19  Yes Doreene Nestlark, Katherine K, NP  insulin regular (NOVOLIN R) 100 units/mL injection Inject 0.28 mLs (28 Units total) into the skin 3 (three) times daily before meals. 08/13/19  Yes Doreene Nestlark, Katherine K, NP  lisinopril (ZESTRIL) 5 MG tablet Take 1 tablet (5 mg total) by mouth daily. For blood pressure and kidney protection. 08/08/19 09/02/20 Yes Doreene Nestlark, Katherine K, NP  metFORMIN (GLUCOPHAGE) 1000 MG tablet Take 1 tablet (1,000 mg total) by mouth 2 (two) times daily with a meal. 08/08/19  Yes Doreene Nestlark, Katherine K, NP  FLUoxetine (PROZAC) 20 MG tablet Take 1 tablet (20 mg total) by mouth daily. For depression. Patient not taking: Reported on 09/02/2020 08/13/19   Doreene Nestlark, Katherine K, NP  HYDROcodone-acetaminophen (NORCO/VICODIN) 5-325 MG tablet Take 1-2 tablets by mouth every 6 (six) hours as needed for moderate pain or severe pain. Patient not taking: Reported on 09/02/2020 01/12/20   Arby BarrettePfeiffer, Marcy, MD  methocarbamol (ROBAXIN) 500 MG tablet Take 1.5 tablets (750 mg total) by mouth every 8 (eight) hours as needed for muscle spasms. Patient not taking: Reported on 09/02/2020 01/12/20   Pfeiffer,  Lebron Conners, MD    Allergies    Patient has no known allergies.  Review of Systems   Review of Systems  Respiratory: Positive for cough and shortness of breath.   Gastrointestinal: Positive for nausea and vomiting.  All other systems reviewed and are negative.   Physical Exam Updated Vital Signs BP 119/66   Pulse (!) 106   Temp 99.8 F (37.7 C)   Resp (!) 22   SpO2 96%   Physical Exam Vitals and nursing note reviewed.  Constitutional:      General: She is not in acute distress.    Appearance: Normal appearance. She is well-developed.  HENT:      Head: Normocephalic and atraumatic.     Right Ear: Hearing normal.     Left Ear: Hearing normal.     Nose: Nose normal.  Eyes:     Conjunctiva/sclera: Conjunctivae normal.     Pupils: Pupils are equal, round, and reactive to light.  Cardiovascular:     Rate and Rhythm: Regular rhythm.     Heart sounds: S1 normal and S2 normal. No murmur heard.  No friction rub. No gallop.   Pulmonary:     Effort: Pulmonary effort is normal. Tachypnea present. No respiratory distress.     Breath sounds: Normal breath sounds.  Chest:     Chest wall: No tenderness.  Abdominal:     General: Bowel sounds are normal.     Palpations: Abdomen is soft.     Tenderness: There is no abdominal tenderness. There is no guarding or rebound. Negative signs include Murphy's sign and McBurney's sign.     Hernia: No hernia is present.  Musculoskeletal:        General: Normal range of motion.     Cervical back: Normal range of motion and neck supple.  Skin:    General: Skin is warm and dry.     Findings: No rash.  Neurological:     Mental Status: She is alert and oriented to person, place, and time.     GCS: GCS eye subscore is 4. GCS verbal subscore is 5. GCS motor subscore is 6.     Cranial Nerves: No cranial nerve deficit.     Sensory: No sensory deficit.     Coordination: Coordination normal.  Psychiatric:        Speech: Speech normal.        Behavior: Behavior normal.        Thought Content: Thought content normal.     ED Results / Procedures / Treatments   Labs (all labs ordered are listed, but only abnormal results are displayed) Labs Reviewed  RESPIRATORY PANEL BY RT PCR (FLU A&B, COVID) - Abnormal; Notable for the following components:      Result Value   SARS Coronavirus 2 by RT PCR POSITIVE (*)    All other components within normal limits  COMPREHENSIVE METABOLIC PANEL - Abnormal; Notable for the following components:   Sodium 132 (*)    Glucose, Bld 228 (*)    Calcium 7.9 (*)    Albumin 2.9  (*)    AST 63 (*)    All other components within normal limits  URINALYSIS, ROUTINE W REFLEX MICROSCOPIC - Abnormal; Notable for the following components:   APPearance HAZY (*)    Glucose, UA 50 (*)    Hgb urine dipstick SMALL (*)    Ketones, ur 5 (*)    Protein, ur 100 (*)    Bacteria, UA MANY (*)  All other components within normal limits  CULTURE, BLOOD (ROUTINE X 2)  CULTURE, BLOOD (ROUTINE X 2)  LIPASE, BLOOD  CBC  HEMOGLOBIN A1C  HIV ANTIBODY (ROUTINE TESTING W REFLEX)  FERRITIN  D-DIMER, QUANTITATIVE (NOT AT [facility])  C-REACTIVE PROTEIN  LACTATE DEHYDROGENASE  PROCALCITONIN  FIBRINOGEN  I-STAT BETA HCG BLOOD, ED ([facility], [facility], [facility] ONLY)    EKG EKG Interpretation  Date/Time:  Wednesday September 02 2020 01:28:45 EDT Ventricular Rate:  113 PR Interval:  130 QRS Duration: 72 QT Interval:  312 QTC Calculation: 427 R Axis:   24 Text Interpretation: Sinus tachycardia Low voltage QRS Cannot rule out Anterior infarct , age undetermined Abnormal ECG Confirmed by Gilda Crease [phone]) on 09/02/2020 3:27:36 AM   Radiology DG Chest Portable 1 View  Result Date: 09/02/2020 CLINICAL DATA:  Shortness of breath.  Cough.  Nausea and vomiting. EXAM: PORTABLE CHEST 1 VIEW COMPARISON:  Apr 06, 2016 FINDINGS: There are hazy bilateral airspace opacities. No pneumothorax. No large pleural effusion. The heart size is mildly enlarged. There is no acute osseous abnormality. IMPRESSION: Hazy bilateral airspace opacities may represent developing atypical infectious process such as viral pneumonia. Electronically Signed   By: Katherine Mantle M.D.   On: 09/02/2020 01:58    Procedures Procedures (including critical care time)  Medications Ordered in ED Medications  atorvastatin (LIPITOR) tablet 20 mg (has no administration in time range)  fenofibrate tablet 160 mg (has no administration in time range)  insulin glargine (LANTUS) Solostar Pen 40 Units (has no administration in time range)    insulin aspart (novoLOG) injection 0-15 Units (has no administration in time range)  enoxaparin (LOVENOX) injection 40 mg (has no administration in time range)  remdesivir 200 mg in sodium chloride 0.9% 250 mL IVPB (has no administration in time range)    Followed by  remdesivir 100 mg in sodium chloride 0.9 % 100 mL IVPB (has no administration in time range)  albuterol (VENTOLIN HFA) 108 (90 Base) MCG/ACT inhaler 2 puff (has no administration in time range)  methylPREDNISolone sodium succinate (SOLU-MEDROL) injection 0.5 mg/kg (has no administration in time range)    Followed by  predniSONE (DELTASONE) tablet 50 mg (has no administration in time range)  guaiFENesin-dextromethorphan (ROBITUSSIN DM) 100-10 MG/5ML syrup 10 mL (has no administration in time range)  chlorpheniramine-HYDROcodone (TUSSIONEX) 10-8 MG/5ML suspension 5 mL (has no administration in time range)  ascorbic acid (VITAMIN C) tablet 500 mg (has no administration in time range)  zinc sulfate capsule 220 mg (has no administration in time range)  cholecalciferol (VITAMIN D3) tablet 1,000 Units (has no administration in time range)  acetaminophen (TYLENOL) tablet 650 mg (has no administration in time range)  ondansetron (ZOFRAN) injection 4 mg (has no administration in time range)    ED Course  I have reviewed the triage vital signs and the nursing notes.  Pertinent labs & imaging results that were available during my care of the patient were reviewed by me and considered in my medical decision making (see chart for details).    MDM Rules/Calculators/A&P                          Patient with 2 weeks of URI symptoms with progressively worsening shortness of breath, hypoxic upon arrival to the emergency department tonight.  Oxygen saturations improved with supplemental oxygen.  Chest x-ray with bilateral pneumonia.  This is consistent with Covid pneumonia.  With the patient's new oxygen requirement, will require  hospitalization for further management.  Final Clinical Impression(s) / ED Diagnoses Final diagnoses:  COVID-19    Rx / DC Orders ED Discharge Orders    None       Aniello Christopoulos, Canary Brim, MD 09/02/20 [phone]

## 2020-09-02 NOTE — H&P (Addendum)
History and Physical    [name] RCV:[id number] DOB: 10-15-1981 DOA: 09/02/2020  PCP: Pleas Koch, NP Patient coming from: Home  Chief Complaint: Shortness of breath  HPI: [name] is a 39 y.o. female with medical history significant of hypertension, hyperlipidemia, insulin-dependent type 2 diabetes presenting with a chief complaint of shortness of breath.  Patient reports feeling ill for the past 2 weeks.  She is endorsing fevers, cough, congestion, shortness of breath, nausea, vomiting, poor appetite, and loss of taste/smell.  Denies chest pain, abdominal pain, or diarrhea.  Reports having a small sore in her genital region for the past few days which is bothering her.  ED Course: Afebrile.  Tachycardic and tachypneic.  Oxygen saturation 82% on room air, improved with 2 L supplemental oxygen.  SARS-CoV-2 PCR test positive.  Influenza panel negative.  WBC 5.9, hemoglobin 12.7, hematocrit 37.6, platelet 208K.  Sodium 132, potassium 3.7, chloride 98, bicarb 22, BUN 17, creatinine 0.6, glucose 228.  AST 63, ALT 30, alk phos 40, T bili 0.7.  Lipase normal.  UA with negative nitrite, negative leukocytes, 0-5 WBCs, and many bacteria.  Beta hCG negative.  Chest x-ray showing hazy bilateral airspace opacities concerning for COVID-19 viral pneumonia.  Review of Systems:  All systems reviewed and apart from history of presenting illness, are negative.  Past Medical History:  Diagnosis Date  . Diabetes mellitus   . Hypercholesteremia   . Hypertension   . Pancreatitis     Past Surgical History:  Procedure Laterality Date  . KNEE SURGERY       reports that she has quit smoking. She has never used smokeless tobacco. She reports that she does not drink alcohol and does not use drugs.  No Known Allergies  Family History  Problem Relation Age of Onset  . Cancer Other   . Diabetes Other   . Hypertension Other   . Cancer Paternal Grandfather   . Depression Mother   . Diabetes  Father   . Hypertension Father   . Depression Brother     Prior to Admission medications   Medication Sig Start Date End Date Taking? Authorizing Provider  atorvastatin (LIPITOR) 20 MG tablet Take 1 tablet (20 mg total) by mouth daily. For cholesterol. 08/08/19  Yes Pleas Koch, NP  fenofibrate (TRICOR) 145 MG tablet Take 1 tablet (145 mg total) by mouth daily. For triglycerides. 04/26/19 09/02/20 Yes Pleas Koch, NP  gabapentin (NEURONTIN) 300 MG capsule Take 1 capsule (300 mg total) by mouth at bedtime. For neuropathy. 08/08/19  Yes Pleas Koch, NP  Insulin Glargine (LANTUS) 100 UNIT/ML Solostar Pen Inject 60 Units into the skin at bedtime. Patient taking differently: Inject 40 Units into the skin 2 (two) times daily. Am, and pm 05/27/19  Yes Pleas Koch, NP  insulin regular (NOVOLIN R) 100 units/mL injection Inject 0.28 mLs (28 Units total) into the skin 3 (three) times daily before meals. 08/13/19  Yes Pleas Koch, NP  lisinopril (ZESTRIL) 5 MG tablet Take 1 tablet (5 mg total) by mouth daily. For blood pressure and kidney protection. 08/08/19 09/02/20 Yes Pleas Koch, NP  metFORMIN (GLUCOPHAGE) 1000 MG tablet Take 1 tablet (1,000 mg total) by mouth 2 (two) times daily with a meal. 08/08/19  Yes Pleas Koch, NP  FLUoxetine (PROZAC) 20 MG tablet Take 1 tablet (20 mg total) by mouth daily. For depression. Patient not taking: Reported on 09/02/2020 08/13/19   Pleas Koch, NP  HYDROcodone-acetaminophen (NORCO/VICODIN) 5-325 MG tablet Take 1-2 tablets by mouth every 6 (six) hours as needed for moderate pain or severe pain. Patient not taking: Reported on 09/02/2020 01/12/20   Charlesetta Shanks, MD  methocarbamol (ROBAXIN) 500 MG tablet Take 1.5 tablets (750 mg total) by mouth every 8 (eight) hours as needed for muscle spasms. Patient not taking: Reported on 09/02/2020 01/12/20   Charlesetta Shanks, MD    Physical Exam: Vitals:   09/02/20 0134 09/02/20 0330  09/02/20 0445  BP: 139/72 117/62 119/66  Pulse: (!) 118 (!) 108 (!) 106  Resp: 18 (!) 22   Temp: 99.5 F (37.5 C) 99.8 F (37.7 C)   TempSrc: Oral    SpO2: (!) 82% 96% 96%    Physical Exam Constitutional:      General: She is not in acute distress. HENT:     Head: Normocephalic and atraumatic.  Eyes:     Extraocular Movements: Extraocular movements intact.     Conjunctiva/sclera: Conjunctivae normal.  Cardiovascular:     Rate and Rhythm: Normal rate and regular rhythm.     Pulses: Normal pulses.  Pulmonary:     Effort: Pulmonary effort is normal.     Breath sounds: Rales present. No wheezing.  Abdominal:     General: Bowel sounds are normal. There is no distension.     Palpations: Abdomen is soft.     Tenderness: There is no abdominal tenderness.  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  No tenderness, fluctuance, or erythema.  No active drainage. Musculoskeletal:        General: No swelling or tenderness.     Cervical back: Normal range of motion and neck supple.  Skin:    General: Skin is warm and dry.  Neurological:     General: No focal deficit present.     Mental Status: She is alert and oriented to person, place, and time.     Labs on Admission: I have personally reviewed following labs and imaging studies  CBC: Recent Labs  Lab 09/02/20 0143  WBC 5.9  HGB 12.7  HCT 37.6  MCV 81.4  PLT 188   Basic Metabolic Panel: Recent Labs  Lab 09/02/20 0143  NA 132*  K 3.7  CL 98  CO2 22  GLUCOSE 228*  BUN 17  CREATININE 0.62  CALCIUM 7.9*   GFR: CrCl cannot be calculated (Unknown ideal weight.). Liver Function Tests: Recent Labs  Lab 09/02/20 0143  AST 63*  ALT 30  ALKPHOS 40  BILITOT 0.7  PROT 7.1  ALBUMIN 2.9*   Recent Labs  Lab 09/02/20 0143  LIPASE 39   No results for input(s): AMMONIA in the last 168  hours. Coagulation Profile: No results for input(s): INR, PROTIME in the last 168 hours. Cardiac Enzymes: No results for input(s): CKTOTAL, CKMB, CKMBINDEX, TROPONINI in the last 168 hours. BNP (last 3 results) No results for input(s): PROBNP in the last 8760 hours. HbA1C: No results for input(s): HGBA1C in the last 72 hours. CBG: No results for input(s): GLUCAP in the last 168 hours. Lipid Profile: No results for input(s): CHOL, HDL, LDLCALC, TRIG, CHOLHDL, LDLDIRECT in the last 72 hours. Thyroid Function Tests: No results for input(s): TSH, T4TOTAL, FREET4, T3FREE, THYROIDAB in the last 72 hours. Anemia Panel: No results for input(s): VITAMINB12, FOLATE, FERRITIN, TIBC, IRON, RETICCTPCT in the last 72 hours. Urine analysis:  Component Value Date/Time   COLORURINE YELLOW 09/02/2020 0140   APPEARANCEUR HAZY (A) 09/02/2020 0140   APPEARANCEUR Hazy 10/09/2014 0846   LABSPEC 1.019 09/02/2020 0140   LABSPEC 1.036 10/09/2014 0846   PHURINE 5.0 09/02/2020 0140   GLUCOSEU 50 (A) 09/02/2020 0140   GLUCOSEU >=500 10/09/2014 0846   HGBUR SMALL (A) 09/02/2020 0140   BILIRUBINUR NEGATIVE 09/02/2020 0140   BILIRUBINUR Negative 10/09/2014 0846   KETONESUR 5 (A) 09/02/2020 0140   PROTEINUR 100 (A) 09/02/2020 0140   NITRITE NEGATIVE 09/02/2020 0140   LEUKOCYTESUR NEGATIVE 09/02/2020 0140   LEUKOCYTESUR 2+ 10/09/2014 0846    Radiological Exams on Admission: DG Chest Portable 1 View  Result Date: 09/02/2020 CLINICAL DATA:  Shortness of breath.  Cough.  Nausea and vomiting. EXAM: PORTABLE CHEST 1 VIEW COMPARISON:  Apr 06, 2016 FINDINGS: There are hazy bilateral airspace opacities. No pneumothorax. No large pleural effusion. The heart size is mildly enlarged. There is no acute osseous abnormality. IMPRESSION: Hazy bilateral airspace opacities may represent developing atypical infectious process such as viral pneumonia. Electronically Signed   By: Constance Holster M.D.   On: 09/02/2020 01:58     EKG: Independently reviewed.  Sinus tachycardia, no significant change since prior tracing.  Assessment/Plan Principal Problem:   Pneumonia due to COVID-19 virus Active Problems:   Essential hypertension   Sepsis ([place])   Acute hypoxemic respiratory failure ([facility])   Nausea and vomiting   Sepsis and acute hypoxemic respiratory failure secondary to COVID-19 viral pneumonia: SARS-CoV-2 PCR test positive.  Meets SIRS criteria (tachycardia, tachypnea) and has a source of infection. Chest x-ray showing hazy bilateral airspace opacities concerning for COVID-19 viral pneumonia.  Oxygen saturation 82% on room air, currently satting well on 2 L supplemental oxygen. -Remdesivir -IV Solu-Medrol 0.5 mg/kg every 12 hours -Vitamin C, zinc, vitamin D -Antitussives as needed -Tylenol as needed -Bronchodilator as needed -Check inflammatory markers including ferritin, fibrinogen, D-dimer, CRP, LDH -Check procalcitonin level -Daily CBC with differential, CMP, CRP, D-dimer, LDH -Airborne and contact precautions -Incentive spirometry, flutter valve -Encourage prone positioning -Continuous pulse ox -Supplemental oxygen as needed to keep oxygen saturation above 90% -Blood culture x2 ordered  Nausea and vomiting: Likely due to COVID-19 viral gastroenteritis.  Lipase normal and no significant elevation of LFTs.  Abdominal exam benign.  UA with bacteriuria but no pyuria.  Patient is not endorsing any UTI symptoms. Beta hCG negative.  No vomiting since she has been in the ED. -Symptomatic management: Antiemetic as needed  Furuncle of left labia majora: No tenderness, fluctuance, or erythema.  No active drainage.  No signs of abscess or cellulitis at this time.  No leukocytosis on labs. -Continue to monitor.  Warm compress 3-4 times per day.  Consider starting an antibiotic if it worsens.  Hypertension: Currently normotensive. -Hold ACE inhibitor given poor oral intake and risk of kidney  injury  Hyperlipidemia -Continue statin and fenofibrate  Insulin-dependent type 2 diabetes -Check A1c.  Sliding scale insulin moderate with meals.  Continue home basal insulin..  DVT prophylaxis: Lovenox Code Status: Full code Family Communication: No family at this time. Disposition Plan: Status is: Inpatient  Remains inpatient appropriate because:IV treatments appropriate due to intensity of illness or inability to take PO, Inpatient level of care appropriate due to severity of illness and New supplemental oxygen requirement   Dispo: The patient is from: Home              Anticipated d/c is to: Home  Anticipated d/c date is: > 3 days              Patient currently is not medically stable to d/c.  The medical decision making on this patient was of high complexity and the patient is at high risk for clinical deterioration, therefore this is a level 3 visit.  Shela Leff MD [facility]  If 7PM-7AM, please contact night-coverage [email or web address]  09/02/2020, 5:15 AM

## 2020-09-02 NOTE — Plan of Care (Signed)

## 2020-09-02 NOTE — Progress Notes (Signed)
[name]  DTO:[id number] DOB: January 04, 1981 DOA: 09/02/2020 PCP: Doreene Nest, NP    Brief Narrative:  39 year old with a history of HTN, HLD, and DM2 who presented with SOB after feeling poorly for 2 weeks.  Other complaints included fever, cough, congestion, nausea, poor appetite, and loss of taste.  On presentation to the ED she was found to have an oxygen saturation of 80% on room air and confirmed to be Covid positive.  CXR noted hazy bilateral airspace opacities consistent with Covid pneumonia.  Significant Events:  10/6 admit via ED  Date of Positive COVID Test:  10/6  Vaccination Status: Unvaccinated against Covid  COVID-19 specific Treatment: Remdesivir 10/5 > Steroid 10/5 >  Antimicrobials:  None  DVT prophylaxis: Lovenox  Subjective: Chart reviewed in f/u. Pt examined by [facility] MD earlier this AM.   Assessment & Plan:  COVID Pneumonia - acute hypoxic respiratory failure - sepsis ruled out Continue steroid and Remdesivir - low threshold to dose with IL-6 blocker if she takes a turn for the worse clinically -continue supplemental oxygen support as needed -follow inflammatory marker trend -no evidence of overlying bacterial infection  Recent Labs  Lab 09/02/20 0143 09/02/20 0540  DDIMER  --  0.92*  FERRITIN  --  162  CRP  --  11.6*  ALT 30  --   PROCALCITON  --  <0.10    Nausea and vomiting -Covid gastroenteritis No evidence of pancreatitis -pregnancy test negative  Furuncle of left labia majora Noted at time of exam with no active drainage and no evidence of actual abscess or cellulitis presently  HTN Blood pressure currently well controlled  HLD Hold usual medical therapy until remdesivir dosing completed and intake more consistent  DM 2 Poorly controlled with use of steroids and with active Covid infection -adjust treatment and follow  Code Status: FULL CODE Family Communication:  Status is: Inpatient  Remains inpatient appropriate  because:Inpatient level of care appropriate due to severity of illness   Dispo: The patient is from: Home              Anticipated d/c is to: Home              Anticipated d/c date is: 3 days              Patient currently is not medically stable to d/c.   Consultants:  none  Objective: Blood pressure (!) 101/54, pulse (!) 104, temperature 99.8 F (37.7 C), resp. rate 19, height 5\' 4"  (1.626 m), weight 95.3 kg, SpO2 93 %.  Intake/Output Summary (Last 24 hours) at 09/02/2020 0836 Last data filed at 09/02/2020 0721 Gross per 24 hour  Intake 250 ml  Output --  Net 250 ml   Filed Weights   09/02/20 0500  Weight: 95.3 kg    Examination: Follow up visit completed   CBC: Recent Labs  Lab 09/02/20 0143  WBC 5.9  HGB 12.7  HCT 37.6  MCV 81.4  PLT 208   Basic Metabolic Panel: Recent Labs  Lab 09/02/20 0143  NA 132*  K 3.7  CL 98  CO2 22  GLUCOSE 228*  BUN 17  CREATININE 0.62  CALCIUM 7.9*   GFR: Estimated Creatinine Clearance: 104.6 mL/min (by C-G formula based on SCr of 0.62 mg/dL).  Liver Function Tests: Recent Labs  Lab 09/02/20 0143  AST 63*  ALT 30  ALKPHOS 40  BILITOT 0.7  PROT 7.1  ALBUMIN 2.9*   Recent Labs  Lab  09/02/20 0143  LIPASE 39    HbA1C: Hemoglobin A1C  Date/Time Value Ref Range Status  10/10/2014 04:42 AM 10.4 (H) 4.2 - 6.3 % Final    Comment:    The American Diabetes Association recommends that a primary goal of therapy should be <7% and that physicians should reevaluate the treatment regimen in patients with HbA1c values consistently >8%.   09/12/2013 07:06 AM 9.4 (H) 4.2 - 6.3 % Final    Comment:    The American Diabetes Association recommends that a primary goal of therapy should be <7% and that physicians should reevaluate the treatment regimen in patients with HbA1c values consistently >8%.    Hgb A1c MFr Bld  Date/Time Value Ref Range Status  09/02/2020 05:40 AM 10.9 (H) 4.8 - 5.6 % Final    Comment:     (NOTE) Pre diabetes:          5.7%-6.4%  Diabetes:              >6.4%  Glycemic control for   <7.0% adults with diabetes   07/11/2019 02:52 PM 7.6 (H) <5.7 % of total Hgb Final    Comment:    For someone without known diabetes, a hemoglobin A1c value of 6.5% or greater indicates that they may have  diabetes and this should be confirmed with a follow-up  test. . For someone with known diabetes, a value <7% indicates  that their diabetes is well controlled and a value  greater than or equal to 7% indicates suboptimal  control. A1c targets should be individualized based on  duration of diabetes, age, comorbid conditions, and  other considerations. . Currently, no consensus exists regarding use of hemoglobin A1c for diagnosis of diabetes for children. .     CBG: Recent Labs  Lab 09/02/20 0816  GLUCAP 262*    Recent Results (from the past 240 hour(s))  Respiratory Panel by RT PCR (Flu A&B, Covid) - Nasopharyngeal Swab     Status: Abnormal   Collection Time: 09/02/20  3:31 AM   Specimen: Nasopharyngeal Swab  Result Value Ref Range Status   SARS Coronavirus 2 by RT PCR POSITIVE (A) NEGATIVE Final    Comment: RESULT CALLED TO, READ BACK BY AND VERIFIED WITH: Conception Chancy RN 09/02/20 0421 JDW (NOTE) SARS-CoV-2 target nucleic acids are DETECTED.  SARS-CoV-2 RNA is generally detectable in upper respiratory specimens  during the acute phase of infection. Positive results are indicative of the presence of the identified virus, but do not rule out bacterial infection or co-infection with other pathogens not detected by the test. Clinical correlation with patient history and other diagnostic information is necessary to determine patient infection status. The expected result is Negative.  Fact Sheet for Patients:  https://www.moore.com/  Fact Sheet for Healthcare Providers: https://www.young.biz/  This test is not yet approved or cleared  by the [place] FDA and  has been authorized for detection and/or diagnosis of SARS-CoV-2 by FDA under an Emergency Use Authorization (EUA).  This EUA will remain in effect (meaning this test can be use d) for the duration of  the COVID-19 declaration under Section 564(b)(1) of the Act, 21 U.S.C. section 360bbb-3(b)(1), unless the authorization is terminated or revoked sooner.      Influenza A by PCR NEGATIVE NEGATIVE Final   Influenza B by PCR NEGATIVE NEGATIVE Final    Comment: (NOTE) The Xpert Xpress SARS-CoV-2/FLU/RSV assay is intended as an aid in  the diagnosis of influenza from Nasopharyngeal swab specimens and  should not be used as a sole basis for treatment. Nasal washings and  aspirates are unacceptable for Xpert Xpress SARS-CoV-2/FLU/RSV  testing.  Fact Sheet for Patients: https://www.moore.com/  Fact Sheet for Healthcare Providers: https://www.young.biz/  This test is not yet approved or cleared by the [place] FDA and  has been authorized for detection and/or diagnosis of SARS-CoV-2 by  FDA under an Emergency Use Authorization (EUA). This EUA will remain  in effect (meaning this test can be used) for the duration of the  Covid-19 declaration under Section 564(b)(1) of the Act, 21  U.S.C. section 360bbb-3(b)(1), unless the authorization is  terminated or revoked. Performed at [facility], [address], [place], [place] [address]      Scheduled Meds: . vitamin C  500 mg Oral Daily  . atorvastatin  20 mg Oral Daily  . cholecalciferol  1,000 Units Oral Daily  . enoxaparin (LOVENOX) injection  40 mg Subcutaneous Q24H  . fenofibrate  160 mg Oral Daily  . insulin aspart  0-15 Units Subcutaneous TID WC  . insulin glargine  40 Units Subcutaneous BID  . methylPREDNISolone (SOLU-MEDROL) injection  0.5 mg/kg Intravenous Q12H   Followed by  . [START ON 09/05/2020] predniSONE  50 mg Oral Daily  . zinc sulfate  220  mg Oral Daily   Continuous Infusions: . [START ON 09/03/2020] remdesivir 100 mg in NS 100 mL       LOS: 0 days   Lonia Blood, MD [facility] Office  [phone] Pager - Text Page per Amion  If 7PM-7AM, please contact night-coverage per Amion 09/02/2020, 8:36 AM

## 2020-09-03 LAB — CBC WITH DIFFERENTIAL/PLATELET
Abs Immature Granulocytes: 0.04 10*3/uL (ref 0.00–0.07)
Basophils Absolute: 0 10*3/uL (ref 0.0–0.1)
Basophils Relative: 0 %
Eosinophils Absolute: 0 10*3/uL (ref 0.0–0.5)
Eosinophils Relative: 0 %
HCT: 37.1 % (ref 36.0–46.0)
Hemoglobin: 12 g/dL (ref 12.0–15.0)
Immature Granulocytes: 1 %
Lymphocytes Relative: 17 %
Lymphs Abs: 0.7 10*3/uL (ref 0.7–4.0)
MCH: 26.8 pg (ref 26.0–34.0)
MCHC: 32.3 g/dL (ref 30.0–36.0)
MCV: 82.8 fL (ref 80.0–100.0)
Monocytes Absolute: 0.2 10*3/uL (ref 0.1–1.0)
Monocytes Relative: 5 %
Neutro Abs: 3.1 10*3/uL (ref 1.7–7.7)
Neutrophils Relative %: 77 %
Platelets: 206 10*3/uL (ref 150–400)
RBC: 4.48 MIL/uL (ref 3.87–5.11)
RDW: 13.9 % (ref 11.5–15.5)
Smear Review: ADEQUATE
WBC: 4 10*3/uL (ref 4.0–10.5)
nRBC: 0 % (ref 0.0–0.2)

## 2020-09-03 LAB — COMPREHENSIVE METABOLIC PANEL
ALT: 36 U/L (ref 0–44)
AST: 31 U/L (ref 15–41)
Albumin: 2.4 g/dL — ABNORMAL LOW (ref 3.5–5.0)
Alkaline Phosphatase: 42 U/L (ref 38–126)
Anion gap: 12 (ref 5–15)
BUN: 23 mg/dL — ABNORMAL HIGH (ref 6–20)
CO2: 22 mmol/L (ref 22–32)
Calcium: 8.4 mg/dL — ABNORMAL LOW (ref 8.9–10.3)
Chloride: 101 mmol/L (ref 98–111)
Creatinine, Ser: 0.69 mg/dL (ref 0.44–1.00)
GFR calc non Af Amer: >60 mL/min (ref >60)
Glucose, Bld: 363 mg/dL — ABNORMAL HIGH (ref 70–99)
Potassium: 4 mmol/L (ref 3.5–5.1)
Sodium: 135 mmol/L (ref 135–145)
Total Bilirubin: 0.3 mg/dL (ref 0.3–1.2)
Total Protein: 6.6 g/dL (ref 6.5–8.1)

## 2020-09-03 LAB — D-DIMER, QUANTITATIVE: D-Dimer, Quant: 0.69 ug/mL-FEU — ABNORMAL HIGH (ref 0.00–0.50)

## 2020-09-03 LAB — GLUCOSE, CAPILLARY
Glucose-Capillary: 312 mg/dL — ABNORMAL HIGH (ref 70–99)
Glucose-Capillary: 414 mg/dL — ABNORMAL HIGH (ref 70–99)
Glucose-Capillary: 320 mg/dL — ABNORMAL HIGH (ref 70–99)
Glucose-Capillary: 304 mg/dL — ABNORMAL HIGH (ref 70–99)

## 2020-09-03 LAB — FERRITIN: Ferritin: 173 ng/mL (ref 11–307)

## 2020-09-03 LAB — C-REACTIVE PROTEIN: CRP: 10.2 mg/dL — ABNORMAL HIGH (ref <1.0)

## 2020-09-03 MED ORDER — INSULIN GLARGINE 100 UNIT/ML ~~LOC~~ SOLN
10.0000 [IU] | Freq: Once | SUBCUTANEOUS | Status: AC
  Administered 2020-09-03: 10 [IU] via SUBCUTANEOUS
  Filled 2020-09-03: qty 0.1

## 2020-09-03 MED ORDER — INSULIN ASPART 100 UNIT/ML ~~LOC~~ SOLN: 3.0000 [IU] | Freq: Three times a day (TID) | SUBCUTANEOUS | Status: DC

## 2020-09-03 MED ORDER — INSULIN ASPART 100 UNIT/ML ~~LOC~~ SOLN
24.0000 [IU] | Freq: Once | SUBCUTANEOUS | Status: AC
  Administered 2020-09-03: 24 [IU] via SUBCUTANEOUS

## 2020-09-03 MED ORDER — INSULIN GLARGINE 100 UNIT/ML ~~LOC~~ SOLN
50.0000 [IU] | Freq: Two times a day (BID) | SUBCUTANEOUS | Status: DC
  Administered 2020-09-03 – 2020-09-04 (×2): 50 [IU] via SUBCUTANEOUS
  Filled 2020-09-03 (×3): qty 0.5

## 2020-09-03 MED ORDER — INSULIN ASPART 100 UNIT/ML ~~LOC~~ SOLN
8.0000 [IU] | Freq: Three times a day (TID) | SUBCUTANEOUS | Status: DC
  Administered 2020-09-03 – 2020-09-04 (×2): 8 [IU] via SUBCUTANEOUS

## 2020-09-03 NOTE — Progress Notes (Addendum)
Inpatient Diabetes Program Recommendations  AACE/ADA: New Consensus Statement on Inpatient Glycemic Control (2015)  Target Ranges:  Prepandial:   less than 140 mg/dL      Peak postprandial:   less than 180 mg/dL (1-2 hours)      Critically ill patients:  140 - 180 mg/dL   Lab Results  Component Value Date   GLUCAP 312 (H) 09/03/2020   HGBA1C 10.9 (H) 09/02/2020    Review of Glycemic Control Results for [name], [name] (MRN [id number]) as of 09/03/2020 10:13  Ref. Range 09/02/2020 12:00 09/02/2020 16:13 09/02/2020 20:37 09/03/2020 07:17  Glucose-Capillary Latest Ref Range: 70 - 99 mg/dL 299 (H) 242 (H) 683 (H) 312 (H)   Diabetes history: Type 2 DM Outpatient Diabetes medications: Lantus 40 units BID, Novolin R 28 units TID, Metformin 1000 mg BID Current orders for Inpatient glycemic control: Lantus 50 units BID, Novolog 3 units TID, Novolog 0-20 units TID, Novolog 0-5 units QHS, Tradjenta 5 mg QD Prednisone Solumedrol  Inpatient Diabetes Program Recommendations:    Consider also increasing Novolog 8 units TID (assuming patient is consuming >50% of meals).  Noted in MAR, additional Lantus 10 units x 1 was held due to duplicate dosing. Reached out to RN to ensure this dose was given in addition to the Lantus 40 units.  Spoke with patient by phone regarding outpatient diabetes management. Patient states, "Getting my insulin isn't a problem, but other medications I cannot get because of lack of insurance. Recently, my doctor decreased my insulin dosages because of lack of absorption."  Reviewed patient's current A1c of 10.9%. Explained what a A1c is and what it measures. Also reviewed goal A1c with patient, importance of good glucose control @ home, and blood sugar goals. Reviewed patho of DM, need for additional insulin, current inpatient glucose trends, vascular changes, impact of Covid and steroids on trends, and other commodities. Patient has a meter and tests blood sugars 2 times per day  and reports values >200. Reports that MD in a local clinic decreased doses because she was not absorbing the insulin. Patient denies hypoglycemia, insulin stretching and reports that CBGs were looking better on higher doses.  Informed that patient may require higher doses, especially with steroids and covid. Patient has ample supply of Lantus and knows how to convert to [facility] Relion insulin. States she able to afford [employer] insulin, however other medication she is struggling. Stressed the importance of having outpatient follow up regarding DM given the possible need for steroid taper and in an effort to have some one follow to see improvement to A1C. Placed TOC consult to help assist in follow up as patient states that she has received an orange card in the past, however was "not accepted as a new patient because the clinic was no longer taking new patients". When reviewed the care notes and patient visit, patient continues; no documentation provided.  Reviewed plate method and importance of being mindful. Also, reviewed increasing activity as able to improve insulin resistance. Denies sugary beverages.  Has no further questions at this time.   Thanks, Lujean Rave, MSN, RNC-OB Diabetes Coordinator [phone] (8a-5p)

## 2020-09-03 NOTE — Plan of Care (Signed)

## 2020-09-03 NOTE — Progress Notes (Signed)
[name]  IDP:[id number] DOB: Oct 10, 1981 DOA: 09/02/2020 PCP: Doreene Nest, NP    Brief Narrative:  39 year old with a history of HTN, HLD, and DM2 who presented with SOB after feeling poorly for 2 weeks.  Other complaints included fever, cough, congestion, nausea, poor appetite, and loss of taste.  On presentation to the ED she was found to have an oxygen saturation of 80% on room air and confirmed to be Covid positive.  CXR noted hazy bilateral airspace opacities consistent with Covid pneumonia.  Significant Events:  10/6 admit via ED  Date of Positive COVID Test:  10/6  Vaccination Status: Unvaccinated against Covid  COVID-19 specific Treatment: Remdesivir 10/6 > Steroid 10/5 >  Antimicrobials:  None  DVT prophylaxis: Lovenox  Subjective: Continues to require oxygen support, presently 3 L nasal cannula with saturations in upper 80s to low 90s.  Vital signs otherwise stable.  Denies any new complaints.  States that overall she is feeling better today.  No chest pain nausea vomiting abdominal pain.  Assessment & Plan:  COVID Pneumonia - acute hypoxic respiratory failure - sepsis ruled out Continue steroid and Remdesivir - low threshold to dose with IL-6 blocker if she takes a turn for the worse clinically -continue supplemental oxygen support as needed -follow inflammatory marker trend -no evidence of overlying bacterial infection  Recent Labs  Lab 09/02/20 0143 09/02/20 0540 09/03/20 0418  DDIMER  --  0.92* 0.69*  FERRITIN  --  162 173  CRP  --  11.6* 10.2*  ALT 30  --  36  PROCALCITON  --  <0.10  --     Nausea and vomiting -Covid gastroenteritis No evidence of pancreatitis -pregnancy test negative  Furuncle of left labia majora Noted at time of exam with no active drainage and no evidence of actual abscess or cellulitis presently  HTN Blood pressure well controlled  HLD Hold usual medical therapy until remdesivir dosing completed and intake more  consistent  DM 2 Poorly controlled with use of steroids and with active Covid infection -adjust treatment again today and continue to follow closely  Code Status: FULL CODE Family Communication:  Status is: Inpatient  Remains inpatient appropriate because:Inpatient level of care appropriate due to severity of illness   Dispo: The patient is from: Home              Anticipated d/c is to: Home              Anticipated d/c date is: 3 days              Patient currently is not medically stable to d/c.   Consultants:  none  Objective: Blood pressure 106/61, pulse 88, temperature 98.2 F (36.8 C), resp. rate 19, height 5\' 4"  (1.626 m), weight 95.3 kg, SpO2 94 %.  Intake/Output Summary (Last 24 hours) at 09/03/2020 0849 Last data filed at 09/03/2020 0534 Gross per 24 hour  Intake 480 ml  Output --  Net 480 ml   Filed Weights   09/02/20 0500  Weight: 95.3 kg    Examination: General: No acute respiratory distress Lungs: Fine scattered crackles bilaterally with no wheezing Cardiovascular: Regular rate and rhythm without murmur gallop or rub normal S1 and S2 Abdomen: Nontender, nondistended, soft, bowel sounds positive, no rebound, no ascites, no appreciable mass Extremities: No significant cyanosis, clubbing, or edema bilateral lower extremities   CBC: Recent Labs  Lab 09/02/20 0143 09/03/20 0418  WBC 5.9 4.0  NEUTROABS  --  3.1  HGB 12.7 12.0  HCT 37.6 37.1  MCV 81.4 82.8  PLT 208 206   Basic Metabolic Panel: Recent Labs  Lab 09/02/20 0143 09/03/20 0418  NA 132* 135  K 3.7 4.0  CL 98 101  CO2 22 22  GLUCOSE 228* 363*  BUN 17 23*  CREATININE 0.62 0.69  CALCIUM 7.9* 8.4*   GFR: Estimated Creatinine Clearance: 104.6 mL/min (by C-G formula based on SCr of 0.69 mg/dL).  Liver Function Tests: Recent Labs  Lab 09/02/20 0143 09/03/20 0418  AST 63* 31  ALT 30 36  ALKPHOS 40 42  BILITOT 0.7 0.3  PROT 7.1 6.6  ALBUMIN 2.9* 2.4*   Recent Labs  Lab  09/02/20 0143  LIPASE 39    HbA1C: Hemoglobin A1C  Date/Time Value Ref Range Status  10/10/2014 04:42 AM 10.4 (H) 4.2 - 6.3 % Final    Comment:    The American Diabetes Association recommends that a primary goal of therapy should be <7% and that physicians should reevaluate the treatment regimen in patients with HbA1c values consistently >8%.   09/12/2013 07:06 AM 9.4 (H) 4.2 - 6.3 % Final    Comment:    The American Diabetes Association recommends that a primary goal of therapy should be <7% and that physicians should reevaluate the treatment regimen in patients with HbA1c values consistently >8%.    Hgb A1c MFr Bld  Date/Time Value Ref Range Status  09/02/2020 05:40 AM 10.9 (H) 4.8 - 5.6 % Final    Comment:    (NOTE) Pre diabetes:          5.7%-6.4%  Diabetes:              >6.4%  Glycemic control for   <7.0% adults with diabetes   07/11/2019 02:52 PM 7.6 (H) <5.7 % of total Hgb Final    Comment:    For someone without known diabetes, a hemoglobin A1c value of 6.5% or greater indicates that they may have  diabetes and this should be confirmed with a follow-up  test. . For someone with known diabetes, a value <7% indicates  that their diabetes is well controlled and a value  greater than or equal to 7% indicates suboptimal  control. A1c targets should be individualized based on  duration of diabetes, age, comorbid conditions, and  other considerations. . Currently, no consensus exists regarding use of hemoglobin A1c for diagnosis of diabetes for children. .     CBG: Recent Labs  Lab 09/02/20 0816 09/02/20 1200 09/02/20 1613 09/02/20 2037 09/03/20 0717  GLUCAP 262* 313* 362* 377* 312*    Recent Results (from the past 240 hour(s))  Respiratory Panel by RT PCR (Flu A&B, Covid) - Nasopharyngeal Swab     Status: Abnormal   Collection Time: 09/02/20  3:31 AM   Specimen: Nasopharyngeal Swab  Result Value Ref Range Status   SARS Coronavirus 2 by RT PCR  POSITIVE (A) NEGATIVE Final    Comment: RESULT CALLED TO, READ BACK BY AND VERIFIED WITH: Conception Chancy RN 09/02/20 0421 JDW (NOTE) SARS-CoV-2 target nucleic acids are DETECTED.  SARS-CoV-2 RNA is generally detectable in upper respiratory specimens  during the acute phase of infection. Positive results are indicative of the presence of the identified virus, but do not rule out bacterial infection or co-infection with other pathogens not detected by the test. Clinical correlation with patient history and other diagnostic information is necessary to determine patient infection status. The expected result is Negative.  Fact  Sheet for Patients:  https://www.moore.com/  Fact Sheet for Healthcare Providers: https://www.young.biz/  This test is not yet approved or cleared by the [place] FDA and  has been authorized for detection and/or diagnosis of SARS-CoV-2 by FDA under an Emergency Use Authorization (EUA).  This EUA will remain in effect (meaning this test can be use d) for the duration of  the COVID-19 declaration under Section 564(b)(1) of the Act, 21 U.S.C. section 360bbb-3(b)(1), unless the authorization is terminated or revoked sooner.      Influenza A by PCR NEGATIVE NEGATIVE Final   Influenza B by PCR NEGATIVE NEGATIVE Final    Comment: (NOTE) The Xpert Xpress SARS-CoV-2/FLU/RSV assay is intended as an aid in  the diagnosis of influenza from Nasopharyngeal swab specimens and  should not be used as a sole basis for treatment. Nasal washings and  aspirates are unacceptable for Xpert Xpress SARS-CoV-2/FLU/RSV  testing.  Fact Sheet for Patients: https://www.moore.com/  Fact Sheet for Healthcare Providers: https://www.young.biz/  This test is not yet approved or cleared by the [place] FDA and  has been authorized for detection and/or diagnosis of SARS-CoV-2 by  FDA under an Emergency Use  Authorization (EUA). This EUA will remain  in effect (meaning this test can be used) for the duration of the  Covid-19 declaration under Section 564(b)(1) of the Act, 21  U.S.C. section 360bbb-3(b)(1), unless the authorization is  terminated or revoked. Performed at [facility], [address], [place], [place] [address]      Scheduled Meds: . vitamin C  500 mg Oral Daily  . cholecalciferol  1,000 Units Oral Daily  . enoxaparin (LOVENOX) injection  40 mg Subcutaneous Q24H  . fenofibrate  160 mg Oral Daily  . gabapentin  300 mg Oral QHS  . insulin aspart  0-20 Units Subcutaneous TID WC  . insulin aspart  0-5 Units Subcutaneous QHS  . insulin glargine  40 Units Subcutaneous BID  . linagliptin  5 mg Oral Daily  . methylPREDNISolone (SOLU-MEDROL) injection  0.5 mg/kg Intravenous Q12H   Followed by  . [START ON 09/05/2020] predniSONE  50 mg Oral Daily  . zinc sulfate  220 mg Oral Daily   Continuous Infusions: . remdesivir 100 mg in NS 100 mL 100 mg (09/03/20 0747)     LOS: 1 day   Lonia Blood, MD [facility] Office  [phone] Pager - Text Page per Loretha Stapler  If 7PM-7AM, please contact night-coverage per Amion 09/03/2020, 8:49 AM

## 2020-09-04 LAB — GLUCOSE, CAPILLARY
Glucose-Capillary: 255 mg/dL — ABNORMAL HIGH (ref 70–99)
Glucose-Capillary: 289 mg/dL — ABNORMAL HIGH (ref 70–99)
Glucose-Capillary: 338 mg/dL — ABNORMAL HIGH (ref 70–99)
Glucose-Capillary: 339 mg/dL — ABNORMAL HIGH (ref 70–99)

## 2020-09-04 LAB — FERRITIN: Ferritin: 136 ng/mL (ref 11–307)

## 2020-09-04 LAB — COMPREHENSIVE METABOLIC PANEL
ALT: 31 U/L (ref 0–44)
AST: 21 U/L (ref 15–41)
Albumin: 2.4 g/dL — ABNORMAL LOW (ref 3.5–5.0)
Alkaline Phosphatase: 40 U/L (ref 38–126)
Anion gap: 10 (ref 5–15)
BUN: 24 mg/dL — ABNORMAL HIGH (ref 6–20)
CO2: 25 mmol/L (ref 22–32)
Calcium: 8.6 mg/dL — ABNORMAL LOW (ref 8.9–10.3)
Chloride: 103 mmol/L (ref 98–111)
Creatinine, Ser: 0.65 mg/dL (ref 0.44–1.00)
GFR calc non Af Amer: >60 mL/min (ref >60)
Glucose, Bld: 298 mg/dL — ABNORMAL HIGH (ref 70–99)
Potassium: 4.1 mmol/L (ref 3.5–5.1)
Sodium: 138 mmol/L (ref 135–145)
Total Bilirubin: 0.4 mg/dL (ref 0.3–1.2)
Total Protein: 6.3 g/dL — ABNORMAL LOW (ref 6.5–8.1)

## 2020-09-04 LAB — CBC WITH DIFFERENTIAL/PLATELET
Abs Immature Granulocytes: 0.09 10*3/uL — ABNORMAL HIGH (ref 0.00–0.07)
Basophils Absolute: 0 10*3/uL (ref 0.0–0.1)
Basophils Relative: 0 %
Eosinophils Absolute: 0 10*3/uL (ref 0.0–0.5)
Eosinophils Relative: 0 %
HCT: 38.6 % (ref 36.0–46.0)
Hemoglobin: 12.5 g/dL (ref 12.0–15.0)
Immature Granulocytes: 1 %
Lymphocytes Relative: 9 %
Lymphs Abs: 0.9 10*3/uL (ref 0.7–4.0)
MCH: 26.8 pg (ref 26.0–34.0)
MCHC: 32.4 g/dL (ref 30.0–36.0)
MCV: 82.7 fL (ref 80.0–100.0)
Monocytes Absolute: 0.5 10*3/uL (ref 0.1–1.0)
Monocytes Relative: 5 %
Neutro Abs: 7.9 10*3/uL — ABNORMAL HIGH (ref 1.7–7.7)
Neutrophils Relative %: 85 %
Platelets: 289 10*3/uL (ref 150–400)
RBC: 4.67 MIL/uL (ref 3.87–5.11)
RDW: 13.9 % (ref 11.5–15.5)
WBC: 9.4 10*3/uL (ref 4.0–10.5)
nRBC: 0 % (ref 0.0–0.2)

## 2020-09-04 LAB — C-REACTIVE PROTEIN: CRP: 6.4 mg/dL — ABNORMAL HIGH (ref <1.0)

## 2020-09-04 LAB — D-DIMER, QUANTITATIVE: D-Dimer, Quant: 0.49 ug/mL-FEU (ref 0.00–0.50)

## 2020-09-04 LAB — MAGNESIUM: Magnesium: 2.3 mg/dL (ref 1.7–2.4)

## 2020-09-04 MED ORDER — POLYETHYLENE GLYCOL 3350 17 G PO PACK
17.0000 g | PACK | Freq: Every day | ORAL | Status: DC | PRN
  Administered 2020-09-04: 17 g via ORAL
  Filled 2020-09-04: qty 1

## 2020-09-04 MED ORDER — INFLUENZA VAC SPLIT QUAD 0.5 ML IM SUSY
0.5000 mL | PREFILLED_SYRINGE | INTRAMUSCULAR | Status: AC
  Administered 2020-09-09: 0.5 mL via INTRAMUSCULAR
  Filled 2020-09-04: qty 0.5

## 2020-09-04 MED ORDER — DICLOFENAC SODIUM 1 % EX GEL
2.0000 g | Freq: Three times a day (TID) | CUTANEOUS | Status: DC
  Filled 2020-09-04: qty 100

## 2020-09-04 MED ORDER — TRAMADOL HCL 50 MG PO TABS
50.0000 mg | ORAL_TABLET | Freq: Four times a day (QID) | ORAL | Status: DC | PRN
  Administered 2020-09-04 – 2020-09-07 (×4): 50 mg via ORAL
  Filled 2020-09-04 (×4): qty 1

## 2020-09-04 MED ORDER — INSULIN GLARGINE 100 UNIT/ML ~~LOC~~ SOLN
56.0000 [IU] | Freq: Two times a day (BID) | SUBCUTANEOUS | Status: DC
  Filled 2020-09-04: qty 0.56

## 2020-09-04 MED ORDER — INSULIN ASPART 100 UNIT/ML ~~LOC~~ SOLN
14.0000 [IU] | Freq: Three times a day (TID) | SUBCUTANEOUS | Status: DC
  Administered 2020-09-04: 14 [IU] via SUBCUTANEOUS

## 2020-09-04 MED ORDER — LIDOCAINE HCL URETHRAL/MUCOSAL 2 % EX GEL
1.0000 "application " | Freq: Four times a day (QID) | CUTANEOUS | Status: DC | PRN
  Administered 2020-09-04 – 2020-09-05 (×2): 1 via TOPICAL
  Filled 2020-09-04 (×3): qty 11

## 2020-09-04 MED ORDER — INSULIN ASPART 100 UNIT/ML ~~LOC~~ SOLN
10.0000 [IU] | Freq: Three times a day (TID) | SUBCUTANEOUS | Status: DC
  Administered 2020-09-04: 10 [IU] via SUBCUTANEOUS

## 2020-09-04 MED ORDER — INSULIN GLARGINE 100 UNIT/ML ~~LOC~~ SOLN
60.0000 [IU] | Freq: Two times a day (BID) | SUBCUTANEOUS | Status: DC
  Administered 2020-09-04: 60 [IU] via SUBCUTANEOUS
  Filled 2020-09-04 (×3): qty 0.6

## 2020-09-04 NOTE — Progress Notes (Signed)
[name]  YPP:[id number] DOB: [birth year] DOA: 09/02/2020 PCP: Doreene Nest, NP    Brief Narrative:  39 year old with a history of HTN, HLD, and DM2 who presented with SOB after feeling poorly for 2 weeks.  Other complaints included fever, cough, congestion, nausea, poor appetite, and loss of taste.  On presentation to the ED she was found to have an oxygen saturation of 80% on room air and confirmed to be Covid positive.  CXR noted hazy bilateral airspace opacities consistent with Covid pneumonia.  Significant Events:  10/6 admit via ED  Date of Positive COVID Test:  10/6  Vaccination Status: Unvaccinated against Covid  COVID-19 specific Treatment: Remdesivir 10/6 > Steroid 10/5 >  Antimicrobials:  None  DVT prophylaxis: Lovenox  Subjective: Oxygen saturations stabilizing in upper 90s with minimal nasal cannula support.  Vital signs otherwise stable.  Patient is afebrile.  States she feels she is improving slowly.  No new complaints today.  Denies chest pain nausea vomiting or abdominal pain.  No diarrhea  Assessment & Plan:  COVID Pneumonia - acute hypoxic respiratory failure - sepsis ruled out Continue steroid and Remdesivir - low threshold to dose with IL-6 blocker if she takes a turn for the worse clinically but inflammatory markers trending downward as she appears to be improving - continue supplemental oxygen support as needed - no evidence of overlying bacterial infection - mobilize - wean O2 as able   Recent Labs  Lab 09/02/20 0143 09/02/20 0540 09/03/20 0418 09/04/20 0300  DDIMER  --  0.92* 0.69* 0.49  FERRITIN  --  162 173 136  CRP  --  11.6* 10.2* 6.4*  ALT 30  --  36 31  PROCALCITON  --  <0.10  --   --     Nausea and vomiting - Covid gastroenteritis No evidence of pancreatitis -pregnancy test negative - resolved   Furuncle of left labia majora Noted at time of exam with no active drainage and no evidence of actual abscess or cellulitis  presently  HTN Blood pressure well controlled  HLD Hold usual medical therapy until remdesivir dosing completed and intake more consistent  DM 2 Poorly controlled with use of steroids and with active Covid infection -CBG improving but not yet at goal -adjust treatment again today and follow response  Code Status: FULL CODE Family Communication:  Status is: Inpatient  Remains inpatient appropriate because:Inpatient level of care appropriate due to severity of illness   Dispo: The patient is from: Home              Anticipated d/c is to: Home              Anticipated d/c date is: 3 days              Patient currently is not medically stable to d/c.   Consultants:  none  Objective: Blood pressure 103/67, pulse 76, temperature 98.5 F (36.9 C), resp. rate 19, height 5\' 4"  (1.626 m), weight 95.3 kg, SpO2 97 %.  Intake/Output Summary (Last 24 hours) at 09/04/2020 0834 Last data filed at 09/04/2020 0400 Gross per 24 hour  Intake 460 ml  Output --  Net 460 ml   Filed Weights   09/02/20 0500  Weight: 95.3 kg    Examination: General: No acute respiratory distress Lungs: Fine scattered crackles bilaterally without change -no wheezing Cardiovascular: RRR without murmur or rub Abdomen: NT/ND, soft, BS positive, no rebound Extremities: No significant edema bilateral lower extremities  CBC: Recent Labs  Lab 09/02/20 0143 09/03/20 0418 09/04/20 0300  WBC 5.9 4.0 9.4  NEUTROABS  --  3.1 7.9*  HGB 12.7 12.0 12.5  HCT 37.6 37.1 38.6  MCV 81.4 82.8 82.7  PLT 208 206 289   Basic Metabolic Panel: Recent Labs  Lab 09/02/20 0143 09/03/20 0418 09/04/20 0300  NA 132* 135 138  K 3.7 4.0 4.1  CL 98 101 103  CO2 22 22 25   GLUCOSE 228* 363* 298*  BUN 17 23* 24*  CREATININE 0.62 0.69 0.65  CALCIUM 7.9* 8.4* 8.6*  MG  --   --  2.3   GFR: Estimated Creatinine Clearance: 104.6 mL/min (by C-G formula based on SCr of 0.65 mg/dL).  Liver Function Tests: Recent Labs  Lab  09/02/20 0143 09/03/20 0418 09/04/20 0300  AST 63* 31 21  ALT 30 36 31  ALKPHOS 40 42 40  BILITOT 0.7 0.3 0.4  PROT 7.1 6.6 6.3*  ALBUMIN 2.9* 2.4* 2.4*   Recent Labs  Lab 09/02/20 0143  LIPASE 39    HbA1C: Hemoglobin A1C  Date/Time Value Ref Range Status  10/10/2014 04:42 AM 10.4 (H) 4.2 - 6.3 % Final    Comment:    The American Diabetes Association recommends that a primary goal of therapy should be <7% and that physicians should reevaluate the treatment regimen in patients with HbA1c values consistently >8%.   09/12/2013 07:06 AM 9.4 (H) 4.2 - 6.3 % Final    Comment:    The American Diabetes Association recommends that a primary goal of therapy should be <7% and that physicians should reevaluate the treatment regimen in patients with HbA1c values consistently >8%.    Hgb A1c MFr Bld  Date/Time Value Ref Range Status  09/02/2020 05:40 AM 10.9 (H) 4.8 - 5.6 % Final    Comment:    (NOTE) Pre diabetes:          5.7%-6.4%  Diabetes:              >6.4%  Glycemic control for   <7.0% adults with diabetes   07/11/2019 02:52 PM 7.6 (H) <5.7 % of total Hgb Final    Comment:    For someone without known diabetes, a hemoglobin A1c value of 6.5% or greater indicates that they may have  diabetes and this should be confirmed with a follow-up  test. . For someone with known diabetes, a value <7% indicates  that their diabetes is well controlled and a value  greater than or equal to 7% indicates suboptimal  control. A1c targets should be individualized based on  duration of diabetes, age, comorbid conditions, and  other considerations. . Currently, no consensus exists regarding use of hemoglobin A1c for diagnosis of diabetes for children. .     CBG: Recent Labs  Lab 09/03/20 0717 09/03/20 1139 09/03/20 1622 09/03/20 2132 09/04/20 0736  GLUCAP 312* 414* 320* 304* 255*    Recent Results (from the past 240 hour(s))  Respiratory Panel by RT PCR (Flu A&B,  Covid) - Nasopharyngeal Swab     Status: Abnormal   Collection Time: 09/02/20  3:31 AM   Specimen: Nasopharyngeal Swab  Result Value Ref Range Status   SARS Coronavirus 2 by RT PCR POSITIVE (A) NEGATIVE Final    Comment: RESULT CALLED TO, READ BACK BY AND VERIFIED WITH: Conception ChancyY PATTERSON RN 09/02/20 0421 JDW (NOTE) SARS-CoV-2 target nucleic acids are DETECTED.  SARS-CoV-2 RNA is generally detectable in upper respiratory specimens  during the acute phase  of infection. Positive results are indicative of the presence of the identified virus, but do not rule out bacterial infection or co-infection with other pathogens not detected by the test. Clinical correlation with patient history and other diagnostic information is necessary to determine patient infection status. The expected result is Negative.  Fact Sheet for Patients:  https://www.moore.com/  Fact Sheet for Healthcare Providers: https://www.young.biz/  This test is not yet approved or cleared by the [place] FDA and  has been authorized for detection and/or diagnosis of SARS-CoV-2 by FDA under an Emergency Use Authorization (EUA).  This EUA will remain in effect (meaning this test can be use d) for the duration of  the COVID-19 declaration under Section 564(b)(1) of the Act, 21 U.S.C. section 360bbb-3(b)(1), unless the authorization is terminated or revoked sooner.      Influenza A by PCR NEGATIVE NEGATIVE Final   Influenza B by PCR NEGATIVE NEGATIVE Final    Comment: (NOTE) The Xpert Xpress SARS-CoV-2/FLU/RSV assay is intended as an aid in  the diagnosis of influenza from Nasopharyngeal swab specimens and  should not be used as a sole basis for treatment. Nasal washings and  aspirates are unacceptable for Xpert Xpress SARS-CoV-2/FLU/RSV  testing.  Fact Sheet for Patients: https://www.moore.com/  Fact Sheet for Healthcare  Providers: https://www.young.biz/  This test is not yet approved or cleared by the [place] FDA and  has been authorized for detection and/or diagnosis of SARS-CoV-2 by  FDA under an Emergency Use Authorization (EUA). This EUA will remain  in effect (meaning this test can be used) for the duration of the  Covid-19 declaration under Section 564(b)(1) of the Act, 21  U.S.C. section 360bbb-3(b)(1), unless the authorization is  terminated or revoked. Performed at [facility], [address] [place], [place] [address]   Culture, blood (routine x 2)     Status: None (Preliminary result)   Collection Time: 09/02/20  9:49 AM   Specimen: BLOOD RIGHT HAND  Result Value Ref Range Status   Specimen Description BLOOD RIGHT HAND  Final   Special Requests   Final    BOTTLES DRAWN AEROBIC ONLY Blood Culture results may not be optimal due to an inadequate volume of blood received in culture bottles   Culture   Final    NO GROWTH 2 DAYS Performed at [facility], [address], [place], [place] [address]    Report Status PENDING  Incomplete  Culture, blood (routine x 2)     Status: None (Preliminary result)   Collection Time: 09/02/20  9:50 AM   Specimen: BLOOD RIGHT ARM  Result Value Ref Range Status   Specimen Description BLOOD RIGHT ARM  Final   Special Requests   Final    BOTTLES DRAWN AEROBIC ONLY Blood Culture results may not be optimal due to an inadequate volume of blood received in culture bottles   Culture   Final    NO GROWTH 2 DAYS Performed at [facility], [address], [place], [place] [address]    Report Status PENDING  Incomplete     Scheduled Meds: . vitamin C  500 mg Oral Daily  . cholecalciferol  1,000 Units Oral Daily  . enoxaparin (LOVENOX) injection  40 mg Subcutaneous Q24H  . fenofibrate  160 mg Oral Daily  . gabapentin  300 mg Oral QHS  . [START ON 09/05/2020] influenza vac split quadrivalent PF  0.5 mL Intramuscular  Tomorrow-1000  . insulin aspart  0-20 Units  Subcutaneous TID WC  . insulin aspart  0-5 Units Subcutaneous QHS  . insulin aspart  8 Units Subcutaneous TID WC  . insulin glargine  50 Units Subcutaneous BID  . linagliptin  5 mg Oral Daily  . methylPREDNISolone (SOLU-MEDROL) injection  0.5 mg/kg Intravenous Q12H   Followed by  . [START ON 09/05/2020] predniSONE  50 mg Oral Daily  . zinc sulfate  220 mg Oral Daily   Continuous Infusions: . remdesivir 100 mg in NS 100 mL 100 mg (09/04/20 0824)     LOS: 2 days   Lonia Blood, MD [facility] Office  [phone] Pager - Text Page per Loretha Stapler  If 7PM-7AM, please contact night-coverage per Amion 09/04/2020, 8:34 AM

## 2020-09-04 NOTE — Plan of Care (Signed)

## 2020-09-05 LAB — COMPREHENSIVE METABOLIC PANEL
ALT: 31 U/L (ref 0–44)
AST: 23 U/L (ref 15–41)
Albumin: 2.6 g/dL — ABNORMAL LOW (ref 3.5–5.0)
Alkaline Phosphatase: 45 U/L (ref 38–126)
Anion gap: 13 (ref 5–15)
BUN: 25 mg/dL — ABNORMAL HIGH (ref 6–20)
CO2: 25 mmol/L (ref 22–32)
Calcium: 9.1 mg/dL (ref 8.9–10.3)
Chloride: 102 mmol/L (ref 98–111)
Creatinine, Ser: 0.71 mg/dL (ref 0.44–1.00)
GFR, Estimated: >60 mL/min (ref >60)
Glucose, Bld: 304 mg/dL — ABNORMAL HIGH (ref 70–99)
Potassium: 4.5 mmol/L (ref 3.5–5.1)
Sodium: 140 mmol/L (ref 135–145)
Total Bilirubin: 0.2 mg/dL — ABNORMAL LOW (ref 0.3–1.2)
Total Protein: 6.2 g/dL — ABNORMAL LOW (ref 6.5–8.1)

## 2020-09-05 LAB — CBC WITH DIFFERENTIAL/PLATELET
Abs Immature Granulocytes: 0.14 10*3/uL — ABNORMAL HIGH (ref 0.00–0.07)
Basophils Absolute: 0 10*3/uL (ref 0.0–0.1)
Basophils Relative: 0 %
Eosinophils Absolute: 0 10*3/uL (ref 0.0–0.5)
Eosinophils Relative: 0 %
HCT: 38.9 % (ref 36.0–46.0)
Hemoglobin: 12.4 g/dL (ref 12.0–15.0)
Immature Granulocytes: 2 %
Lymphocytes Relative: 10 %
Lymphs Abs: 0.9 10*3/uL (ref 0.7–4.0)
MCH: 26.7 pg (ref 26.0–34.0)
MCHC: 31.9 g/dL (ref 30.0–36.0)
MCV: 83.7 fL (ref 80.0–100.0)
Monocytes Absolute: 0.5 10*3/uL (ref 0.1–1.0)
Monocytes Relative: 6 %
Neutro Abs: 6.8 10*3/uL (ref 1.7–7.7)
Neutrophils Relative %: 82 %
Platelets: 311 10*3/uL (ref 150–400)
RBC: 4.65 MIL/uL (ref 3.87–5.11)
RDW: 13.6 % (ref 11.5–15.5)
WBC: 8.3 10*3/uL (ref 4.0–10.5)
nRBC: 0 % (ref 0.0–0.2)

## 2020-09-05 LAB — GLUCOSE, CAPILLARY
Glucose-Capillary: 212 mg/dL — ABNORMAL HIGH (ref 70–99)
Glucose-Capillary: 289 mg/dL — ABNORMAL HIGH (ref 70–99)
Glucose-Capillary: 273 mg/dL — ABNORMAL HIGH (ref 70–99)
Glucose-Capillary: 341 mg/dL — ABNORMAL HIGH (ref 70–99)

## 2020-09-05 LAB — D-DIMER, QUANTITATIVE: D-Dimer, Quant: 0.53 ug/mL-FEU — ABNORMAL HIGH (ref 0.00–0.50)

## 2020-09-05 LAB — FERRITIN: Ferritin: 105 ng/mL (ref 11–307)

## 2020-09-05 MED ORDER — INSULIN ASPART 100 UNIT/ML ~~LOC~~ SOLN
18.0000 [IU] | Freq: Three times a day (TID) | SUBCUTANEOUS | Status: DC
  Administered 2020-09-05 – 2020-09-06 (×3): 18 [IU] via SUBCUTANEOUS

## 2020-09-05 MED ORDER — CEPHALEXIN 500 MG PO CAPS
500.0000 mg | ORAL_CAPSULE | Freq: Four times a day (QID) | ORAL | Status: DC
  Administered 2020-09-05 – 2020-09-07 (×8): 500 mg via ORAL
  Filled 2020-09-05 (×8): qty 1

## 2020-09-05 MED ORDER — INSULIN GLARGINE 100 UNIT/ML ~~LOC~~ SOLN
64.0000 [IU] | Freq: Two times a day (BID) | SUBCUTANEOUS | Status: DC
  Administered 2020-09-05 – 2020-09-06 (×4): 64 [IU] via SUBCUTANEOUS
  Filled 2020-09-05 (×6): qty 0.64

## 2020-09-05 NOTE — Consult Note (Signed)
Reason for Consult:Bartholin abscess   [name] is an 39 y.o. female P0 admitted with covid-19 pneumonia reporting vulva pain. Patient states pain started 5 days ago and has gotten progressively worst. Patient is sexually active using Nexplanon for contraception. She reports a monthly period lasting 3-5 days. Patient denies any pelvic pain or abnormal discharge. Patient with past medical history significant for type 2 DM, CHTM and hyperlipidemia  Pertinent Gynecological History: Menses: regular every month without intermenstrual spotting Contraception: Nexplanon DES exposure: denies Blood transfusions: none Sexually transmitted diseases: no past history   Menstrual History:  No LMP recorded.    Past Medical History:  Diagnosis Date  . Diabetes mellitus   . Hypercholesteremia   . Hypertension   . Pancreatitis     Past Surgical History:  Procedure Laterality Date  . KNEE SURGERY      Family History  Problem Relation Age of Onset  . Cancer Other   . Diabetes Other   . Hypertension Other   . Cancer Paternal Grandfather   . Depression Mother   . Diabetes Father   . Hypertension Father   . Depression Brother     Social History:  reports that she has quit smoking. She has never used smokeless tobacco. She reports that she does not drink alcohol and does not use drugs.  Allergies: No Known Allergies  Medications: I have reviewed the patient's current medications.  Review of Systems See pertinent in HPI Blood pressure 121/79, pulse 76, temperature 98.3 F (36.8 C), resp. rate 16, height 5\' 4"  (1.626 m), weight 95.3 kg, SpO2 92 %. Physical Exam GENERAL: Well-developed, well-nourished female in no acute distress on nasal canula LUNGS: Bilateral crackles .  HEART: Regular rate and rhythm. ABDOMEN: Soft, nontender, nondistended. No organomegaly. PELVIC: Normal external female genitalia.with 3 cm left bartholin abscess. Tender to palpation. No erythema. No  fluctuance EXTREMITIES: No cyanosis, clubbing, or edema, 2+ distal pulses.  Results for orders placed or performed during the hospital encounter of 09/02/20 (from the past 48 hour(s))  Glucose, capillary     Status: Abnormal   Collection Time: 09/03/20  4:22 PM  Result Value Ref Range   Glucose-Capillary 320 (H) 70 - 99 mg/dL    Comment: Glucose reference range applies only to samples taken after fasting for at least 8 hours.   Comment 1 Glucose Stabilizer   Glucose, capillary     Status: Abnormal   Collection Time: 09/03/20  9:32 PM  Result Value Ref Range   Glucose-Capillary 304 (H) 70 - 99 mg/dL    Comment: Glucose reference range applies only to samples taken after fasting for at least 8 hours.  CBC with Differential/Platelet     Status: Abnormal   Collection Time: 09/04/20  3:00 AM  Result Value Ref Range   WBC 9.4 4.0 - 10.5 K/uL   RBC 4.67 3.87 - 5.11 MIL/uL   Hemoglobin 12.5 12.0 - 15.0 g/dL   HCT 11/04/20 36 - 46 %   MCV 82.7 80.0 - 100.0 fL   MCH 26.8 26.0 - 34.0 pg   MCHC 32.4 30.0 - 36.0 g/dL   RDW 84.6 96.2 - 95.2 %   Platelets 289 150 - 400 K/uL   nRBC 0.0 0.0 - 0.2 %   Neutrophils Relative % 85 %   Neutro Abs 7.9 (H) 1.7 - 7.7 K/uL   Lymphocytes Relative 9 %   Lymphs Abs 0.9 0.7 - 4.0 K/uL   Monocytes Relative 5 %   Monocytes  Absolute 0.5 0.1 - 1.0 K/uL   Eosinophils Relative 0 %   Eosinophils Absolute 0.0 0 - 0 K/uL   Basophils Relative 0 %   Basophils Absolute 0.0 0 - 0 K/uL   Immature Granulocytes 1 %   Abs Immature Granulocytes 0.09 (H) 0.00 - 0.07 K/uL    Comment: Performed at [facility], [address] [place], [place] [address]  Comprehensive metabolic panel     Status: Abnormal   Collection Time: 09/04/20  3:00 AM  Result Value Ref Range   Sodium 138 135 - 145 mmol/L   Potassium 4.1 3.5 - 5.1 mmol/L   Chloride 103 98 - 111 mmol/L   CO2 25 22 - 32 mmol/L   Glucose, Bld 298 (H) 70 - 99 mg/dL    Comment: Glucose reference range applies only to  samples taken after fasting for at least 8 hours.   BUN 24 (H) 6 - 20 mg/dL   Creatinine, Ser 9.140.65 0.44 - 1.00 mg/dL   Calcium 8.6 (L) 8.9 - 10.3 mg/dL   Total Protein 6.3 (L) 6.5 - 8.1 g/dL   Albumin 2.4 (L) 3.5 - 5.0 g/dL   AST 21 15 - 41 U/L   ALT 31 0 - 44 U/L   Alkaline Phosphatase 40 38 - 126 U/L   Total Bilirubin 0.4 0.3 - 1.2 mg/dL   GFR calc non Af Amer >60 >60 mL/min   Anion gap 10 5 - 15    Comment: Performed at [facility], [address] [place], [place] [address]  C-reactive protein     Status: Abnormal   Collection Time: 09/04/20  3:00 AM  Result Value Ref Range   CRP 6.4 (H) <1.0 mg/dL    Comment: Performed at [facility], [address] [place], [place] [address]  D-dimer, quantitative (not at [facility])     Status: None   Collection Time: 09/04/20  3:00 AM  Result Value Ref Range   D-Dimer, Quant 0.49 0.00 - 0.50 ug/mL-FEU    Comment: (NOTE) At the manufacturer cut-off value of 0.5 g/mL FEU, this assay has a negative predictive value of 95-100%.This assay is intended for use in conjunction with a clinical pretest probability (PTP) assessment model to exclude pulmonary embolism (PE) and deep venous thrombosis (DVT) in outpatients suspected of PE or DVT. Results should be correlated with clinical presentation. Performed at [facility], [address] [place], [place] [address]   Ferritin     Status: None   Collection Time: 09/04/20  3:00 AM  Result Value Ref Range   Ferritin 136 11 - 307 ng/mL    Comment: Performed at [facility], [address] [place], [place] [address]  Magnesium     Status: None   Collection Time: 09/04/20  3:00 AM  Result Value Ref Range   Magnesium 2.3 1.7 - 2.4 mg/dL    Comment: Performed at [facility], [address] [place], [place] [address]  Glucose, capillary     Status: Abnormal   Collection Time: 09/04/20  7:36 AM  Result Value Ref Range   Glucose-Capillary 255 (H) 70 - 99 mg/dL     Comment: Glucose reference range applies only to samples taken after fasting for at least 8 hours.  Glucose, capillary     Status: Abnormal   Collection Time: 09/04/20 11:57 AM  Result Value Ref Range   Glucose-Capillary 289 (H) 70 - 99 mg/dL  Comment: Glucose reference range applies only to samples taken after fasting for at least 8 hours.  Glucose, capillary     Status: Abnormal   Collection Time: 09/04/20  4:13 PM  Result Value Ref Range   Glucose-Capillary 338 (H) 70 - 99 mg/dL    Comment: Glucose reference range applies only to samples taken after fasting for at least 8 hours.  Glucose, capillary     Status: Abnormal   Collection Time: 09/04/20  9:23 PM  Result Value Ref Range   Glucose-Capillary 339 (H) 70 - 99 mg/dL    Comment: Glucose reference range applies only to samples taken after fasting for at least 8 hours.  CBC with Differential/Platelet     Status: Abnormal   Collection Time: 09/05/20  2:13 AM  Result Value Ref Range   WBC 8.3 4.0 - 10.5 K/uL   RBC 4.65 3.87 - 5.11 MIL/uL   Hemoglobin 12.4 12.0 - 15.0 g/dL   HCT 96.2 36 - 46 %   MCV 83.7 80.0 - 100.0 fL   MCH 26.7 26.0 - 34.0 pg   MCHC 31.9 30.0 - 36.0 g/dL   RDW 83.6 62.9 - 47.6 %   Platelets 311 150 - 400 K/uL   nRBC 0.0 0.0 - 0.2 %   Neutrophils Relative % 82 %   Neutro Abs 6.8 1.7 - 7.7 K/uL   Lymphocytes Relative 10 %   Lymphs Abs 0.9 0.7 - 4.0 K/uL   Monocytes Relative 6 %   Monocytes Absolute 0.5 0.1 - 1.0 K/uL   Eosinophils Relative 0 %   Eosinophils Absolute 0.0 0 - 0 K/uL   Basophils Relative 0 %   Basophils Absolute 0.0 0 - 0 K/uL   Immature Granulocytes 2 %   Abs Immature Granulocytes 0.14 (H) 0.00 - 0.07 K/uL    Comment: Performed at [facility], [address] [place], [place] [address]  Comprehensive metabolic panel     Status: Abnormal   Collection Time: 09/05/20  2:13 AM  Result Value Ref Range   Sodium 140 135 - 145 mmol/L   Potassium 4.5 3.5 - 5.1 mmol/L   Chloride 102 98 -  111 mmol/L   CO2 25 22 - 32 mmol/L   Glucose, Bld 304 (H) 70 - 99 mg/dL    Comment: Glucose reference range applies only to samples taken after fasting for at least 8 hours.   BUN 25 (H) 6 - 20 mg/dL   Creatinine, Ser 3.54 0.44 - 1.00 mg/dL   Calcium 9.1 8.9 - 65.6 mg/dL   Total Protein 6.2 (L) 6.5 - 8.1 g/dL   Albumin 2.6 (L) 3.5 - 5.0 g/dL   AST 23 15 - 41 U/L   ALT 31 0 - 44 U/L   Alkaline Phosphatase 45 38 - 126 U/L   Total Bilirubin 0.2 (L) 0.3 - 1.2 mg/dL   GFR, Estimated >81 >27 mL/min   Anion gap 13 5 - 15    Comment: Performed at [facility], [address], [place], [place] [address]  D-dimer, quantitative (not at [facility])     Status: Abnormal   Collection Time: 09/05/20  2:13 AM  Result Value Ref Range   D-Dimer, Quant 0.53 (H) 0.00 - 0.50 ug/mL-FEU    Comment: (NOTE) At the manufacturer cut-off value of 0.5 g/mL FEU, this assay has a negative predictive value of 95-100%.This assay is intended for use in conjunction with a clinical pretest probability (PTP) assessment model to  exclude pulmonary embolism (PE) and deep venous thrombosis (DVT) in outpatients suspected of PE or DVT. Results should be correlated with clinical presentation. Performed at [facility], [address] [place], [place] [address]   Ferritin     Status: None   Collection Time: 09/05/20  2:13 AM  Result Value Ref Range   Ferritin 105 11 - 307 ng/mL    Comment: Performed at [facility], [address], [place], [place] [address]  Glucose, capillary     Status: Abnormal   Collection Time: 09/05/20  7:56 AM  Result Value Ref Range   Glucose-Capillary 212 (H) 70 - 99 mg/dL    Comment: Glucose reference range applies only to samples taken after fasting for at least 8 hours.  Glucose, capillary     Status: Abnormal   Collection Time: 09/05/20 11:53 AM  Result Value Ref Range   Glucose-Capillary 289 (H) 70 - 99 mg/dL    Comment: Glucose reference range applies only to samples  taken after fasting for at least 8 hours.    No results found.  Assessment/Plan: 39 yo admitted with covid viral pneumonia with left bartholin abscess - Abscess not ready for I&D - Encourage application of warm compress to the area, at least 15 minutes every hour - Would recommend starting antibiotics-keflex  - Continue optimizing glycemic control - Will continue to follow - Please contact Ob/Gyn on call at [phone] with further questions  Jaikob Borgwardt 09/05/2020

## 2020-09-05 NOTE — Progress Notes (Signed)
[name]  TGG:[id number] DOB: [birth year] DOA: 09/02/2020 PCP: Doreene Nest, NP    Brief Narrative:  [phone] with a history of HTN, HLD, and DM2 who presented with SOB after feeling poorly for 2 weeks.  Other complaints included fever, cough, congestion, nausea, poor appetite, and loss of taste.  On presentation to the ED she was found to have an oxygen saturation of 80% on room air and confirmed to be Covid positive.  CXR noted hazy bilateral airspace opacities consistent with Covid pneumonia.  Significant Events:  10/6 admit via ED  Date of Positive COVID Test:  10/6  Vaccination Status: Unvaccinated against Covid  COVID-19 specific Treatment: Remdesivir 10/6 > Steroid 10/5 >  Antimicrobials:  None  DVT prophylaxis: Lovenox  Subjective: Afebrile with stable vital signs.  Oxygen saturations 92% on 2 L nasal cannula support.  Reports severe pain of the vulva related to what is thought to be a Bartholin's abscess.  Warm compresses have had very little impact.  She denies chest pain nausea vomiting abdominal  Assessment & Plan:  COVID Pneumonia - acute hypoxic respiratory failure - sepsis ruled out Continue steroid and Remdesivir - has not required dosing with other immunosuppressive medications - no evidence of overlying bacterial infection - mobilize - wean O2 as able   Recent Labs  Lab 09/02/20 0143 09/02/20 0540 09/03/20 0418 09/04/20 0300 09/05/20 0213  DDIMER  --  0.92* 0.69* 0.49 0.53*  FERRITIN  --  162 173 136 105  CRP  --  11.6* 10.2* 6.4*  --   ALT 30  --  36 31 31  PROCALCITON  --  <0.10  --   --   --     Nausea and vomiting - Covid gastroenteritis No evidence of pancreatitis - pregnancy test negative - resolved   Furuncle of left labia majora - Bartholin's abscess  Noted at time of admit exam - has worsened despite use of warm compresses - I have consulted GYN to evaluate and tx as necessary   HTN Blood pressure well controlled  HLD Hold  usual medical therapy until remdesivir dosing completed and intake more consistent  DM 2 Poorly controlled with use of steroids and with active Covid infection -CBG continues to trend in the right direction but is not yet at goal -adjust treatment again today  Code Status: FULL CODE Family Communication:  Status is: Inpatient  Remains inpatient appropriate because:Inpatient level of care appropriate due to severity of illness   Dispo: The patient is from: Home              Anticipated d/c is to: Home              Anticipated d/c date is: 3 days              Patient currently is not medically stable to d/c.   Consultants:  none  Objective: Blood pressure 121/79, pulse 76, temperature 98.3 F (36.8 C), resp. rate 16, height 5\' 4"  (1.626 m), weight 95.3 kg, SpO2 92 %.  Intake/Output Summary (Last 24 hours) at 09/05/2020 11/05/2020 Last data filed at 09/04/2020 1931 Gross per 24 hour  Intake 580 ml  Output --  Net 580 ml   Filed Weights   09/02/20 0500  Weight: 95.3 kg    Examination: General: No acute respiratory distress - alert  Lungs: Fine scattered crackles bilaterally - no wheeze  Cardiovascular: RRR without murmur or rub Abdomen: NT/ND, soft, BS positive, no rebound Extremities:  trace edema bilateral lower extremities   CBC: Recent Labs  Lab 09/03/20 0418 09/04/20 0300 09/05/20 0213  WBC 4.0 9.4 8.3  NEUTROABS 3.1 7.9* 6.8  HGB 12.0 12.5 12.4  HCT 37.1 38.6 38.9  MCV 82.8 82.7 83.7  PLT 206 289 311   Basic Metabolic Panel: Recent Labs  Lab 09/03/20 0418 09/04/20 0300 09/05/20 0213  NA 135 138 140  K 4.0 4.1 4.5  CL 101 103 102  CO2 22 25 25   GLUCOSE 363* 298* 304*  BUN 23* 24* 25*  CREATININE 0.69 0.65 0.71  CALCIUM 8.4* 8.6* 9.1  MG  --  2.3  --    GFR: Estimated Creatinine Clearance: 104.6 mL/min (by C-G formula based on SCr of 0.71 mg/dL).  Liver Function Tests: Recent Labs  Lab 09/02/20 0143 09/03/20 0418 09/04/20 0300 09/05/20 0213   AST 63* 31 21 23   ALT 30 36 31 31  ALKPHOS 40 42 40 45  BILITOT 0.7 0.3 0.4 0.2*  PROT 7.1 6.6 6.3* 6.2*  ALBUMIN 2.9* 2.4* 2.4* 2.6*   Recent Labs  Lab 09/02/20 0143  LIPASE 39    HbA1C: Hemoglobin A1C  Date/Time Value Ref Range Status  10/10/2014 04:42 AM 10.4 (H) 4.2 - 6.3 % Final    Comment:    The American Diabetes Association recommends that a primary goal of therapy should be <7% and that physicians should reevaluate the treatment regimen in patients with HbA1c values consistently >8%.   09/12/2013 07:06 AM 9.4 (H) 4.2 - 6.3 % Final    Comment:    The American Diabetes Association recommends that a primary goal of therapy should be <7% and that physicians should reevaluate the treatment regimen in patients with HbA1c values consistently >8%.    Hgb A1c MFr Bld  Date/Time Value Ref Range Status  09/02/2020 05:40 AM 10.9 (H) 4.8 - 5.6 % Final    Comment:    (NOTE) Pre diabetes:          5.7%-6.4%  Diabetes:              >6.4%  Glycemic control for   <7.0% adults with diabetes   07/11/2019 02:52 PM 7.6 (H) <5.7 % of total Hgb Final    Comment:    For someone without known diabetes, a hemoglobin A1c value of 6.5% or greater indicates that they may have  diabetes and this should be confirmed with a follow-up  test. . For someone with known diabetes, a value <7% indicates  that their diabetes is well controlled and a value  greater than or equal to 7% indicates suboptimal  control. A1c targets should be individualized based on  duration of diabetes, age, comorbid conditions, and  other considerations. . Currently, no consensus exists regarding use of hemoglobin A1c for diagnosis of diabetes for children. .     CBG: Recent Labs  Lab 09/04/20 0736 09/04/20 1157 09/04/20 1613 09/04/20 2123 09/05/20 0756  GLUCAP 255* 289* 338* 339* 212*    Recent Results (from the past 240 hour(s))  Respiratory Panel by RT PCR (Flu A&B, Covid) - Nasopharyngeal  Swab     Status: Abnormal   Collection Time: 09/02/20  3:31 AM   Specimen: Nasopharyngeal Swab  Result Value Ref Range Status   SARS Coronavirus 2 by RT PCR POSITIVE (A) NEGATIVE Final    Comment: RESULT CALLED TO, READ BACK BY AND VERIFIED WITH: Conception ChancyY PATTERSON RN 09/02/20 0421 JDW (NOTE) SARS-CoV-2 target nucleic acids are DETECTED.  SARS-CoV-2  RNA is generally detectable in upper respiratory specimens  during the acute phase of infection. Positive results are indicative of the presence of the identified virus, but do not rule out bacterial infection or co-infection with other pathogens not detected by the test. Clinical correlation with patient history and other diagnostic information is necessary to determine patient infection status. The expected result is Negative.  Fact Sheet for Patients:  https://www.moore.com/  Fact Sheet for Healthcare Providers: https://www.young.biz/  This test is not yet approved or cleared by the [place] FDA and  has been authorized for detection and/or diagnosis of SARS-CoV-2 by FDA under an Emergency Use Authorization (EUA).  This EUA will remain in effect (meaning this test can be use d) for the duration of  the COVID-19 declaration under Section 564(b)(1) of the Act, 21 U.S.C. section 360bbb-3(b)(1), unless the authorization is terminated or revoked sooner.      Influenza A by PCR NEGATIVE NEGATIVE Final   Influenza B by PCR NEGATIVE NEGATIVE Final    Comment: (NOTE) The Xpert Xpress SARS-CoV-2/FLU/RSV assay is intended as an aid in  the diagnosis of influenza from Nasopharyngeal swab specimens and  should not be used as a sole basis for treatment. Nasal washings and  aspirates are unacceptable for Xpert Xpress SARS-CoV-2/FLU/RSV  testing.  Fact Sheet for Patients: https://www.moore.com/  Fact Sheet for Healthcare Providers: https://www.young.biz/  This  test is not yet approved or cleared by the [place] FDA and  has been authorized for detection and/or diagnosis of SARS-CoV-2 by  FDA under an Emergency Use Authorization (EUA). This EUA will remain  in effect (meaning this test can be used) for the duration of the  Covid-19 declaration under Section 564(b)(1) of the Act, 21  U.S.C. section 360bbb-3(b)(1), unless the authorization is  terminated or revoked. Performed at [facility], [address] [place], [place] [address]   Culture, blood (routine x 2)     Status: None (Preliminary result)   Collection Time: 09/02/20  9:49 AM   Specimen: BLOOD RIGHT HAND  Result Value Ref Range Status   Specimen Description BLOOD RIGHT HAND  Final   Special Requests   Final    BOTTLES DRAWN AEROBIC ONLY Blood Culture results may not be optimal due to an inadequate volume of blood received in culture bottles   Culture   Final    NO GROWTH 3 DAYS Performed at [facility], [address] [place], [place] [address]    Report Status PENDING  Incomplete  Culture, blood (routine x 2)     Status: None (Preliminary result)   Collection Time: 09/02/20  9:50 AM   Specimen: BLOOD RIGHT ARM  Result Value Ref Range Status   Specimen Description BLOOD RIGHT ARM  Final   Special Requests   Final    BOTTLES DRAWN AEROBIC ONLY Blood Culture results may not be optimal due to an inadequate volume of blood received in culture bottles   Culture   Final    NO GROWTH 3 DAYS Performed at [facility], [address], [place], [place] [address]    Report Status PENDING  Incomplete     Scheduled Meds:  vitamin C  500 mg Oral Daily   cholecalciferol  1,000 Units Oral Daily   enoxaparin (LOVENOX) injection  40 mg Subcutaneous Q24H   fenofibrate  160 mg Oral Daily   gabapentin  300 mg Oral QHS   influenza vac split quadrivalent PF  0.5 mL  Intramuscular Tomorrow-1000   insulin aspart  0-20 Units Subcutaneous TID WC   insulin aspart   0-5 Units Subcutaneous QHS   insulin aspart  14 Units Subcutaneous TID WC   insulin glargine  60 Units Subcutaneous BID   linagliptin  5 mg Oral Daily   predniSONE  50 mg Oral Daily   zinc sulfate  220 mg Oral Daily   Continuous Infusions:  remdesivir 100 mg in NS 100 mL Stopped (09/04/20 1932)     LOS: 3 days   Lonia Blood, MD [facility] Office  [phone] Pager - Text Page per Loretha Stapler  If 7PM-7AM, please contact night-coverage per Amion 09/05/2020, 8:33 AM

## 2020-09-06 DIAGNOSIS — J1282 Pneumonia due to Coronavirus disease 2019: Secondary | ICD-10-CM

## 2020-09-06 DIAGNOSIS — N751 Abscess of Bartholin's gland: Secondary | ICD-10-CM | POA: Diagnosis present

## 2020-09-06 DIAGNOSIS — U071 COVID-19: Secondary | ICD-10-CM

## 2020-09-06 LAB — CBC WITH DIFFERENTIAL/PLATELET
Abs Immature Granulocytes: 0.17 10*3/uL — ABNORMAL HIGH (ref 0.00–0.07)
Basophils Absolute: 0 10*3/uL (ref 0.0–0.1)
Basophils Relative: 0 %
Eosinophils Absolute: 0 10*3/uL (ref 0.0–0.5)
Eosinophils Relative: 0 %
HCT: 38.8 % (ref 36.0–46.0)
Hemoglobin: 12.6 g/dL (ref 12.0–15.0)
Immature Granulocytes: 2 %
Lymphocytes Relative: 15 %
Lymphs Abs: 1.1 10*3/uL (ref 0.7–4.0)
MCH: 27 pg (ref 26.0–34.0)
MCHC: 32.5 g/dL (ref 30.0–36.0)
MCV: 83.3 fL (ref 80.0–100.0)
Monocytes Absolute: 0.5 10*3/uL (ref 0.1–1.0)
Monocytes Relative: 7 %
Neutro Abs: 6 10*3/uL (ref 1.7–7.7)
Neutrophils Relative %: 76 %
Platelets: 327 10*3/uL (ref 150–400)
RBC: 4.66 MIL/uL (ref 3.87–5.11)
RDW: 13.2 % (ref 11.5–15.5)
WBC: 7.8 10*3/uL (ref 4.0–10.5)
nRBC: 0 % (ref 0.0–0.2)

## 2020-09-06 LAB — GLUCOSE, CAPILLARY
Glucose-Capillary: 158 mg/dL — ABNORMAL HIGH (ref 70–99)
Glucose-Capillary: 153 mg/dL — ABNORMAL HIGH (ref 70–99)
Glucose-Capillary: 290 mg/dL — ABNORMAL HIGH (ref 70–99)
Glucose-Capillary: 295 mg/dL — ABNORMAL HIGH (ref 70–99)

## 2020-09-06 LAB — COMPREHENSIVE METABOLIC PANEL
ALT: 25 U/L (ref 0–44)
AST: 18 U/L (ref 15–41)
Albumin: 2.7 g/dL — ABNORMAL LOW (ref 3.5–5.0)
Alkaline Phosphatase: 47 U/L (ref 38–126)
Anion gap: 12 (ref 5–15)
BUN: 22 mg/dL — ABNORMAL HIGH (ref 6–20)
CO2: 27 mmol/L (ref 22–32)
Calcium: 9 mg/dL (ref 8.9–10.3)
Chloride: 98 mmol/L (ref 98–111)
Creatinine, Ser: 0.65 mg/dL (ref 0.44–1.00)
GFR, Estimated: >60 mL/min (ref >60)
Glucose, Bld: 277 mg/dL — ABNORMAL HIGH (ref 70–99)
Potassium: 4.1 mmol/L (ref 3.5–5.1)
Sodium: 137 mmol/L (ref 135–145)
Total Bilirubin: 0.3 mg/dL (ref 0.3–1.2)
Total Protein: 6.1 g/dL — ABNORMAL LOW (ref 6.5–8.1)

## 2020-09-06 LAB — FERRITIN: Ferritin: 90 ng/mL (ref 11–307)

## 2020-09-06 LAB — D-DIMER, QUANTITATIVE: D-Dimer, Quant: 0.65 ug/mL-FEU — ABNORMAL HIGH (ref 0.00–0.50)

## 2020-09-06 MED ORDER — PREDNISONE 20 MG PO TABS
20.0000 mg | ORAL_TABLET | Freq: Every day | ORAL | Status: AC
  Administered 2020-09-09 – 2020-09-10 (×2): 20 mg via ORAL
  Filled 2020-09-06 (×2): qty 1

## 2020-09-06 MED ORDER — PREDNISONE 20 MG PO TABS
40.0000 mg | ORAL_TABLET | Freq: Every day | ORAL | Status: AC
  Administered 2020-09-07 – 2020-09-08 (×2): 40 mg via ORAL
  Filled 2020-09-06 (×2): qty 2

## 2020-09-06 MED ORDER — PREDNISONE 10 MG PO TABS
10.0000 mg | ORAL_TABLET | Freq: Every day | ORAL | Status: DC
  Filled 2020-09-06: qty 1

## 2020-09-06 NOTE — Progress Notes (Signed)
PT Cancellation Note  Patient Details Name: [name] MRN: [id number] DOB: 1981/10/20   Cancelled Treatment:    Reason Eval/Treat Not Completed: Pain limiting ability to participate; reports abscess too painful to walk right now.  States getting to bathroom on her own, but wishes to wait till abscess gets drained likely this pm.  Will attempt later or another day.    Elray Mcgregor 09/06/2020, 10:17 AM  Sheran Lawless, PT Acute Rehabilitation Services [phone] [phone] 09/06/2020

## 2020-09-06 NOTE — Progress Notes (Signed)
[name]  PNT:[id number] DOB: [birth year] DOA: 09/02/2020 PCP: Doreene Nest, NP    Brief Narrative:  [phone] with a history of HTN, HLD, and DM2 who presented with SOB after feeling poorly for 2 weeks.  Other complaints included fever, cough, congestion, nausea, poor appetite, and loss of taste.  On presentation to the ED she was found to have an oxygen saturation of 80% on room air and confirmed to be Covid positive.  CXR noted hazy bilateral airspace opacities consistent with Covid pneumonia.  Significant Events:  10/6 admit via ED  Date of Positive COVID Test:  10/6  Vaccination Status: Unvaccinated against Covid  COVID-19 specific Treatment: Remdesivir 10/6 > 10/10 Steroid 10/5 >  Antimicrobials:  Keflex 10/9 >  DVT prophylaxis: Lovenox  Subjective: Afebrile.  Vital signs stable.  Saturations 90% on 2 L nasal cannula support at rest.  Inflammatory markers stable.  GYN evaluated the patient's Bartholin's abscess and feels it is not yet ready for I&D.  States that her breathing feels better but she is greatly troubled by discomfort in the vulva.  Assessment & Plan:  COVID Pneumonia - acute hypoxic respiratory failure - sepsis ruled out Continue steroid -completes her course of remdesivir today - has not required dosing with other immunosuppressive medications - no evidence of overlying bacterial infection - mobilize - wean O2 as able but still requiring too much support to be a candidate for discharge  Recent Labs  Lab 09/02/20 0143 09/02/20 0540 09/03/20 0418 09/04/20 0300 09/05/20 0213 09/06/20 0236  DDIMER  --  0.92* 0.69* 0.49 0.53* 0.65*  FERRITIN  --  162 173 136 105 90  CRP  --  11.6* 10.2* 6.4*  --   --   ALT 30  --  36 31 31 25   PROCALCITON  --  <0.10  --   --   --   --     Nausea and vomiting - Covid gastroenteritis No evidence of pancreatitis - pregnancy test negative - resolved   Left Bartholin's abscess  Noted at time of admit exam -GYN has  evaluated but it is not yet ready for I&D -antibiotic initiated  HTN Blood pressure well controlled  HLD Hold usual medical therapy until remdesivir dosing completed and intake more consistent  DM 2 Poorly controlled with use of steroids and with active Covid infection -CBG significantly improved as of first check today -follow without change -begin to taper steroid  Code Status: FULL CODE Family Communication:  Status is: Inpatient  Remains inpatient appropriate because:Inpatient level of care appropriate due to severity of illness   Dispo: The patient is from: Home              Anticipated d/c is to: Home              Anticipated d/c date is: 3 days              Patient currently is not medically stable to d/c.   Consultants:  none  Objective: Blood pressure 120/79, pulse 71, temperature 98.2 F (36.8 C), temperature source Oral, resp. rate 17, height 5\' 4"  (1.626 m), weight 95.3 kg, SpO2 95 %. No intake or output data in the 24 hours ending 09/06/20 0837 Filed Weights   09/02/20 0500  Weight: 95.3 kg    Examination: General: No acute respiratory distress Lungs: Fine scattered crackles bilaterally Cardiovascular: RRR -no murmur Abdomen: NT/ND, soft, BS positive, no rebound Extremities: trace edema B LE   CBC:  Recent Labs  Lab 09/04/20 0300 09/05/20 0213 09/06/20 0236  WBC 9.4 8.3 7.8  NEUTROABS 7.9* 6.8 6.0  HGB 12.5 12.4 12.6  HCT 38.6 38.9 38.8  MCV 82.7 83.7 83.3  PLT 289 311 327   Basic Metabolic Panel: Recent Labs  Lab 09/04/20 0300 09/05/20 0213 09/06/20 0236  NA 138 140 137  K 4.1 4.5 4.1  CL 103 102 98  CO2 25 25 27   GLUCOSE 298* 304* 277*  BUN 24* 25* 22*  CREATININE 0.65 0.71 0.65  CALCIUM 8.6* 9.1 9.0  MG 2.3  --   --    GFR: Estimated Creatinine Clearance: 104.6 mL/min (by C-G formula based on SCr of 0.65 mg/dL).  Liver Function Tests: Recent Labs  Lab 09/03/20 0418 09/04/20 0300 09/05/20 0213 09/06/20 0236  AST 31 21 23  18   ALT 36 31 31 25   ALKPHOS 42 40 45 47  BILITOT 0.3 0.4 0.2* 0.3  PROT 6.6 6.3* 6.2* 6.1*  ALBUMIN 2.4* 2.4* 2.6* 2.7*   Recent Labs  Lab 09/02/20 0143  LIPASE 39    HbA1C: Hemoglobin A1C  Date/Time Value Ref Range Status  10/10/2014 04:42 AM 10.4 (H) 4.2 - 6.3 % Final    Comment:    The American Diabetes Association recommends that a primary goal of therapy should be <7% and that physicians should reevaluate the treatment regimen in patients with HbA1c values consistently >8%.   09/12/2013 07:06 AM 9.4 (H) 4.2 - 6.3 % Final    Comment:    The American Diabetes Association recommends that a primary goal of therapy should be <7% and that physicians should reevaluate the treatment regimen in patients with HbA1c values consistently >8%.    Hgb A1c MFr Bld  Date/Time Value Ref Range Status  09/02/2020 05:40 AM 10.9 (H) 4.8 - 5.6 % Final    Comment:    (NOTE) Pre diabetes:          5.7%-6.4%  Diabetes:              >6.4%  Glycemic control for   <7.0% adults with diabetes   07/11/2019 02:52 PM 7.6 (H) <5.7 % of total Hgb Final    Comment:    For someone without known diabetes, a hemoglobin A1c value of 6.5% or greater indicates that they may have  diabetes and this should be confirmed with a follow-up  test. . For someone with known diabetes, a value <7% indicates  that their diabetes is well controlled and a value  greater than or equal to 7% indicates suboptimal  control. A1c targets should be individualized based on  duration of diabetes, age, comorbid conditions, and  other considerations. . Currently, no consensus exists regarding use of hemoglobin A1c for diagnosis of diabetes for children. .     CBG: Recent Labs  Lab 09/05/20 0756 09/05/20 1153 09/05/20 1617 09/05/20 2056 09/06/20 0726  GLUCAP 212* 289* 273* 341* 158*    Recent Results (from the past 240 hour(s))  Respiratory Panel by RT PCR (Flu A&B, Covid) - Nasopharyngeal Swab     Status:  Abnormal   Collection Time: 09/02/20  3:31 AM   Specimen: Nasopharyngeal Swab  Result Value Ref Range Status   SARS Coronavirus 2 by RT PCR POSITIVE (A) NEGATIVE Final    Comment: RESULT CALLED TO, READ BACK BY AND VERIFIED WITH: 11/05/20 RN 09/02/20 0421 JDW (NOTE) SARS-CoV-2 target nucleic acids are DETECTED.  SARS-CoV-2 RNA is generally detectable in upper respiratory specimens  during the acute phase of infection. Positive results are indicative of the presence of the identified virus, but do not rule out bacterial infection or co-infection with other pathogens not detected by the test. Clinical correlation with patient history and other diagnostic information is necessary to determine patient infection status. The expected result is Negative.  Fact Sheet for Patients:  https://www.moore.com/  Fact Sheet for Healthcare Providers: https://www.young.biz/  This test is not yet approved or cleared by the [place] FDA and  has been authorized for detection and/or diagnosis of SARS-CoV-2 by FDA under an Emergency Use Authorization (EUA).  This EUA will remain in effect (meaning this test can be use d) for the duration of  the COVID-19 declaration under Section 564(b)(1) of the Act, 21 U.S.C. section 360bbb-3(b)(1), unless the authorization is terminated or revoked sooner.      Influenza A by PCR NEGATIVE NEGATIVE Final   Influenza B by PCR NEGATIVE NEGATIVE Final    Comment: (NOTE) The Xpert Xpress SARS-CoV-2/FLU/RSV assay is intended as an aid in  the diagnosis of influenza from Nasopharyngeal swab specimens and  should not be used as a sole basis for treatment. Nasal washings and  aspirates are unacceptable for Xpert Xpress SARS-CoV-2/FLU/RSV  testing.  Fact Sheet for Patients: https://www.moore.com/  Fact Sheet for Healthcare Providers: https://www.young.biz/  This test is not yet  approved or cleared by the [place] FDA and  has been authorized for detection and/or diagnosis of SARS-CoV-2 by  FDA under an Emergency Use Authorization (EUA). This EUA will remain  in effect (meaning this test can be used) for the duration of the  Covid-19 declaration under Section 564(b)(1) of the Act, 21  U.S.C. section 360bbb-3(b)(1), unless the authorization is  terminated or revoked. Performed at [facility], [address] [place], [place] [address]   Culture, blood (routine x 2)     Status: None (Preliminary result)   Collection Time: 09/02/20  9:49 AM   Specimen: BLOOD RIGHT HAND  Result Value Ref Range Status   Specimen Description BLOOD RIGHT HAND  Final   Special Requests   Final    BOTTLES DRAWN AEROBIC ONLY Blood Culture results may not be optimal due to an inadequate volume of blood received in culture bottles   Culture   Final    NO GROWTH 3 DAYS Performed at [facility], [address], [place], [place] [address]    Report Status PENDING  Incomplete  Culture, blood (routine x 2)     Status: None (Preliminary result)   Collection Time: 09/02/20  9:50 AM   Specimen: BLOOD RIGHT ARM  Result Value Ref Range Status   Specimen Description BLOOD RIGHT ARM  Final   Special Requests   Final    BOTTLES DRAWN AEROBIC ONLY Blood Culture results may not be optimal due to an inadequate volume of blood received in culture bottles   Culture   Final    NO GROWTH 3 DAYS Performed at [facility], [address] [place], [place] [address]    Report Status PENDING  Incomplete     Scheduled Meds: . vitamin C  500 mg Oral Daily  . cephALEXin  500 mg Oral Q6H  . cholecalciferol  1,000 Units Oral Daily  . enoxaparin (LOVENOX) injection  40 mg Subcutaneous Q24H  . fenofibrate  160 mg Oral Daily  . gabapentin  300 mg Oral QHS  . influenza vac split quadrivalent PF  0.5 mL Intramuscular  Tomorrow-1000  . insulin aspart  0-20 Units Subcutaneous TID WC  .  insulin aspart  0-5 Units Subcutaneous QHS  . insulin aspart  18 Units Subcutaneous TID WC  . insulin glargine  64 Units Subcutaneous BID  . linagliptin  5 mg Oral Daily  . predniSONE  50 mg Oral Daily  . zinc sulfate  220 mg Oral Daily   Continuous Infusions: . remdesivir 100 mg in NS 100 mL 100 mg (09/06/20 0826)     LOS: 4 days   Lonia BloodJeffrey T. Shelva Hetzer, MD [facility] Office  [phone] Pager - Text Page per Loretha StaplerAmion  If 7PM-7AM, please contact night-coverage per Amion 09/06/2020, 8:37 AM

## 2020-09-06 NOTE — Progress Notes (Signed)
   OBSTETRICS AND GYNECOLOGY ATTENDING CONSULT FOLLOW UP NOTE  Reason for Consult: Left Bartholin's gland cyst; patient admitted with COVID pneumonia, has poorly controlled T2DM Consulting Provider: Dr. Jaynie Collins   Subjective: Patient reports continued pain. Not able to do warm compresses as recommended by Dr. Jolayne Panther.  Objective: Blood pressure 115/65, pulse 84, temperature 98 F (36.7 C), temperature source Oral, resp. rate 17, height 5\' 4"  (1.626 m), weight 95.3 kg, SpO2 95 %. CONSTITUTIONAL: Well-developed, well-nourished female in no acute distress.  CARDIOVASCULAR: Normal heart rate noted, regular rhythm RESPIRATORY: Some difficulty noted with breathing,  in place ABDOMEN: Soft, no distention noted.  No tenderness, rebound or guarding.  PELVIC: Normal appearing external genitalia noted.  3 cm Left Bartholin's gland cyst, very tender to palpation. No fluctuance noted but patient screamed for me to stop exam. Unwilling to proceed further today. No erythema noted, no spread to labia noted.. Done in the presence of a chaperone.  Assessment/Plan: - Patient not willing to undergo I&D now, willing to reattempt tomorrow under more analgesia - Encouraged continued application of warm compress to the area, at least 15 minutes every hour as this may help in increased fluctuance or spontaneous rupture.  - Continue Keflex as prescribed - Continue optimizing glycemic control - Continue COVID pneumonia management as per primary team  Appreciate care of by her primary team Will follow along with daily rounding on patient for now. Please call [phone] [facility] OB/GYN Consult Attending Monday-Friday 8am - 5pm) or [phone] [facility] OB/GYN Attending On Call all day, every day) for any gynecologic concerns at any time.  Thank you for involving [facility] in the care of this patient.  Total consultation time including face-to-face time with patient (>50% of time), reviewing chart and  documentation: 20 minutes    [place], MD, FACOG Obstetrician & Gynecologist, [facility] for [facility] Consult Phone: [phone]

## 2020-09-07 DIAGNOSIS — N750 Cyst of Bartholin's gland: Secondary | ICD-10-CM

## 2020-09-07 LAB — CULTURE, BLOOD (ROUTINE X 2)
Culture: NO GROWTH
Culture: NO GROWTH

## 2020-09-07 LAB — CBC WITH DIFFERENTIAL/PLATELET
Abs Immature Granulocytes: 0.2 10*3/uL — ABNORMAL HIGH (ref 0.00–0.07)
Basophils Absolute: 0 10*3/uL (ref 0.0–0.1)
Basophils Relative: 1 %
Eosinophils Absolute: 0 10*3/uL (ref 0.0–0.5)
Eosinophils Relative: 0 %
HCT: 41.1 % (ref 36.0–46.0)
Hemoglobin: 13.5 g/dL (ref 12.0–15.0)
Immature Granulocytes: 2 %
Lymphocytes Relative: 17 %
Lymphs Abs: 1.4 10*3/uL (ref 0.7–4.0)
MCH: 27.4 pg (ref 26.0–34.0)
MCHC: 32.8 g/dL (ref 30.0–36.0)
MCV: 83.4 fL (ref 80.0–100.0)
Monocytes Absolute: 0.6 10*3/uL (ref 0.1–1.0)
Monocytes Relative: 8 %
Neutro Abs: 6.1 10*3/uL (ref 1.7–7.7)
Neutrophils Relative %: 72 %
Platelets: 364 10*3/uL (ref 150–400)
RBC: 4.93 MIL/uL (ref 3.87–5.11)
RDW: 13.2 % (ref 11.5–15.5)
WBC: 8.4 10*3/uL (ref 4.0–10.5)
nRBC: 0 % (ref 0.0–0.2)

## 2020-09-07 LAB — GLUCOSE, CAPILLARY
Glucose-Capillary: 76 mg/dL (ref 70–99)
Glucose-Capillary: 131 mg/dL — ABNORMAL HIGH (ref 70–99)
Glucose-Capillary: 225 mg/dL — ABNORMAL HIGH (ref 70–99)
Glucose-Capillary: 237 mg/dL — ABNORMAL HIGH (ref 70–99)

## 2020-09-07 LAB — COMPREHENSIVE METABOLIC PANEL
ALT: 21 U/L (ref 0–44)
AST: 16 U/L (ref 15–41)
Albumin: 2.6 g/dL — ABNORMAL LOW (ref 3.5–5.0)
Alkaline Phosphatase: 47 U/L (ref 38–126)
Anion gap: 11 (ref 5–15)
BUN: 19 mg/dL (ref 6–20)
CO2: 31 mmol/L (ref 22–32)
Calcium: 9.1 mg/dL (ref 8.9–10.3)
Chloride: 99 mmol/L (ref 98–111)
Creatinine, Ser: 0.56 mg/dL (ref 0.44–1.00)
GFR, Estimated: >60 mL/min (ref >60)
Glucose, Bld: 102 mg/dL — ABNORMAL HIGH (ref 70–99)
Potassium: 4.2 mmol/L (ref 3.5–5.1)
Sodium: 141 mmol/L (ref 135–145)
Total Bilirubin: 0.1 mg/dL — ABNORMAL LOW (ref 0.3–1.2)
Total Protein: 6.3 g/dL — ABNORMAL LOW (ref 6.5–8.1)

## 2020-09-07 LAB — C-REACTIVE PROTEIN: CRP: 1.2 mg/dL — ABNORMAL HIGH (ref <1.0)

## 2020-09-07 LAB — D-DIMER, QUANTITATIVE: D-Dimer, Quant: 0.43 ug/mL-FEU (ref 0.00–0.50)

## 2020-09-07 MED ORDER — HYDROMORPHONE HCL 2 MG PO TABS
2.0000 mg | ORAL_TABLET | Freq: Four times a day (QID) | ORAL | Status: DC | PRN
  Administered 2020-09-08 (×2): 2 mg via ORAL
  Filled 2020-09-07 (×2): qty 1

## 2020-09-07 MED ORDER — CEFAZOLIN SODIUM-DEXTROSE 1-4 GM/50ML-% IV SOLN
1.0000 g | Freq: Three times a day (TID) | INTRAVENOUS | Status: DC
  Administered 2020-09-07 – 2020-09-10 (×7): 1 g via INTRAVENOUS
  Filled 2020-09-07 (×11): qty 50

## 2020-09-07 MED ORDER — HYDROMORPHONE HCL 1 MG/ML IJ SOLN
2.0000 mg | INTRAMUSCULAR | Status: DC | PRN
  Administered 2020-09-08 (×2): 2 mg via INTRAVENOUS
  Filled 2020-09-07 (×2): qty 2

## 2020-09-07 MED ORDER — INSULIN ASPART 100 UNIT/ML ~~LOC~~ SOLN
10.0000 [IU] | Freq: Three times a day (TID) | SUBCUTANEOUS | Status: DC
  Administered 2020-09-07 (×2): 10 [IU] via SUBCUTANEOUS

## 2020-09-07 MED ORDER — HYDROMORPHONE HCL 2 MG PO TABS: 2.0000 mg | ORAL_TABLET | Freq: Four times a day (QID) | ORAL | Status: DC | PRN

## 2020-09-07 MED ORDER — INSULIN GLARGINE 100 UNIT/ML ~~LOC~~ SOLN
58.0000 [IU] | Freq: Two times a day (BID) | SUBCUTANEOUS | Status: DC
  Administered 2020-09-07 – 2020-09-09 (×5): 58 [IU] via SUBCUTANEOUS
  Filled 2020-09-07 (×6): qty 0.58

## 2020-09-07 MED ORDER — PROMETHAZINE HCL 25 MG/ML IJ SOLN: 12.5000 mg | Freq: Four times a day (QID) | INTRAMUSCULAR | Status: DC | PRN

## 2020-09-07 MED ORDER — TRAMADOL HCL 50 MG PO TABS: 50.0000 mg | ORAL_TABLET | Freq: Four times a day (QID) | ORAL | Status: DC | PRN

## 2020-09-07 NOTE — Evaluation (Signed)
Physical Therapy Evaluation Patient Details Name: [name] MRN: [id number] DOB: 1981/09/12 Today's Date: 09/07/2020   History of Present Illness  39yo with a history of HTN, HLD, and DM2 who presented with SOB after feeling poorly for 2 weeks.  Other complaints included fever, cough, congestion, nausea, poor appetite, and loss of taste.  On presentation to the ED she was found to have an oxygen saturation of 80% on room air and confirmed to be Covid positive.  CXR noted hazy bilateral airspace opacities consistent with Covid pneumonia; Also with L labial abscess of vulva, and pain is limiting ambulation  Clinical Impression   Pt admitted with above diagnosis. Comes form home where she lives in a camper with her boyfreind with 2 steps to enter; Presents to PT with pain from vulvar abscess limiting mobility and activity tolerance; Doctors are considering options for I&D of the abscess; She tells me (and nursing confirms) that she is able to get up to the bathroom without physical assist; I anticipate good porgress once labial pain is improved; Hopefully she can wean from supplemental O2 with activty;  Pt currently with functional limitations due to the deficits listed below (see PT Problem List). Pt will benefit from skilled PT to increase their independence and safety with mobility to allow discharge to the venue listed below.       Follow Up Recommendations No PT follow up    Equipment Recommendations  None recommended by PT    Recommendations for Other Services       Precautions / Restrictions Precautions Precautions: None      Mobility  Bed Mobility Overal bed mobility: Needs Assistance Bed Mobility: Rolling Rolling: Supervision         General bed mobility comments: Supervision and cues/suggestions for moving supine to prone  Transfers                 General transfer comment: Declining further mobility due to pain  Ambulation/Gait             General  Gait Details: Declining amb with PT; has been getting up independently to walk to the bathroom per nursing  Stairs            Wheelchair Mobility    Modified Rankin (Stroke Patients Only)       Balance                                             Pertinent Vitals/Pain Pain Assessment: Faces Faces Pain Scale: Hurts a little bit Pain Location: abscess of vulva Pain Descriptors / Indicators: Burning Pain Intervention(s): Monitored during session    Home Living Family/patient expects to be discharged to:: Private residence Living Arrangements: Spouse/significant other (boyfriend)   Type of Home: Other(Comment) (camper) Home Access: Stairs to enter Entrance Stairs-Rails: None Entrance Stairs-Number of Steps: 2 Home Layout: One level Home Equipment: None      Prior Function Level of Independence: Independent         Comments: REcently started a job at [employer]        Extremity/Trunk Assessment   Upper Extremity Assessment Upper Extremity Assessment: Overall WFL for tasks assessed    Lower Extremity Assessment Lower Extremity Assessment:  (** Limited evaluation)       Communication   Communication: No difficulties  Cognition Arousal/Alertness: Awake/alert  Behavior During Therapy: WFL for tasks assessed/performed Overall Cognitive Status: Within Functional Limits for tasks assessed                                 General Comments: Apprehensive at having more pain when walking      General Comments General comments (skin integrity, edema, etc.): Took extra time to explain benefits of mobility, and prone positioning; Pt on 1 L supplemental O2, and O2 sats remained at or near 94%    Exercises     Assessment/Plan    PT Assessment Patient needs continued PT services  PT Problem List Decreased activity tolerance;Decreased mobility;Decreased knowledge of use of DME;Decreased safety  awareness;Decreased knowledge of precautions;Cardiopulmonary status limiting activity       PT Treatment Interventions DME instruction;Gait training;Stair training;Functional mobility training;Therapeutic activities;Therapeutic exercise;Patient/family education    PT Goals (Current goals can be found in the Care Plan section)  Acute Rehab PT Goals Patient Stated Goal: less painl go home soon PT Goal Formulation: With patient Time For Goal Achievement: 09/21/20 Potential to Achieve Goals: Good    Frequency Min 3X/week   Barriers to discharge        Co-evaluation               AM-PAC PT "6 Clicks" Mobility  Outcome Measure Help needed turning from your back to your side while in a flat bed without using bedrails?: None Help needed moving from lying on your back to sitting on the side of a flat bed without using bedrails?: None Help needed moving to and from a bed to a chair (including a wheelchair)?: None Help needed standing up from a chair using your arms (e.g., wheelchair or bedside chair)?: None Help needed to walk in hospital room?: None Help needed climbing 3-5 steps with a railing? : A Little 6 Click Score: 23    End of Session   Activity Tolerance: Patient tolerated treatment well Patient left: in bed;with call bell/phone within reach (prone) Nurse Communication: Mobility status PT Visit Diagnosis: Unsteadiness on feet (R26.81);Other abnormalities of gait and mobility (R26.89);Pain Pain - Right/Left: Left Pain - part of body:  (L labum of vulva)    Time: 1455-1520 PT Time Calculation (min) (ACUTE ONLY): 25 min   Charges:   PT Evaluation $PT Eval Low Complexity: 1 Low PT Treatments $Therapeutic Activity: 8-22 mins        Van Clines, PT  Acute Rehabilitation Services Pager [phone] Office [phone]   [name] 09/07/2020, 5:42 PM

## 2020-09-07 NOTE — Progress Notes (Signed)
GYN Consult   S: Pt reports pain a little better today with pain medication.  PE AF VSS GU 3-4 cm left bartholin cyst still tender to the touch. No fluctuance. No evidence of progression  A/P Left Bartholin Cyst  Pain slowly improving. Pt not a candidate for bedside I & D. Would need to go to the OR for I & D. Do not feel this as indicated at this time. Encourage warm compresses, pt has been refusing. Will change to IV Ancef. Continue to follow with you.

## 2020-09-07 NOTE — Progress Notes (Signed)
[name]  ASN:[id number] DOB: 07-Oct-1981 DOA: 09/02/2020 PCP: Doreene Nest, NP    Brief Narrative:  [phone] with a history of HTN, HLD, and DM2 who presented with SOB after feeling poorly for 2 weeks.  Other complaints included fever, cough, congestion, nausea, poor appetite, and loss of taste.  On presentation to the ED she was found to have an oxygen saturation of 80% on room air and confirmed to be Covid positive.  CXR noted hazy bilateral airspace opacities consistent with Covid pneumonia.  Significant Events:  10/6 admit via ED  Date of Positive COVID Test:  10/6  Vaccination Status: Unvaccinated against Covid  COVID-19 specific Treatment: Remdesivir 10/6 > 10/10 Steroid 10/5 >  Antimicrobials:  Keflex 10/9 > 10/11 Cephazolin 10/11 >  DVT prophylaxis: Lovenox  Subjective: Afebrile.  Vital signs stable.  Saturations 93-95% but still requiring significant oxygen support.  Inflammatory markers nearly normalized at this time.  Progression is being severely inhibited by her Bartholin's abscess which is so exquisitely tender she is not able to ambulate.  Unfortunately she has been refusing warm compresses because of pain.  The abscess has not matured to the point to allow I&D and the level of pain would require probable general anesthesia which is not yet felt to be indicated.  Assessment & Plan:  COVID Pneumonia - acute hypoxic respiratory failure - sepsis ruled out Continue steroid -has completed a course of remdesivir- has not required dosing with other immunosuppressive medications - no evidence of overlying bacterial infection - mobilize - wean O2 as able but still requiring too much support to be a candidate for discharge  Recent Labs  Lab 09/02/20 0540 09/02/20 0540 09/03/20 0418 09/04/20 0300 09/05/20 0213 09/06/20 0236 09/07/20 0442  DDIMER 0.92*   < > 0.69* 0.49 0.53* 0.65* 0.43  FERRITIN 162  --  173 136 105 90  --   CRP 11.6*  --  10.2* 6.4*  --   --   1.2*  ALT  --    < > 36 31 31 25 21   PROCALCITON <0.10  --   --   --   --   --   --    < > = values in this interval not displayed.    Nausea and vomiting - Covid gastroenteritis No evidence of pancreatitis - pregnancy test negative - resolved   Left Bartholin's abscess  Noted at time of admit exam -GYN is following for timing of I&D - antibiotic tx continues   HTN Blood pressure well controlled  HLD Hold usual medical therapy until ready for d/c home   DM 2 Has been poorly controlled with use of steroids and with active Covid infection -CBG now much improved -begin to taper steroids -decrease dose of insulin to avoid hypoglycemia  Code Status: FULL CODE Family Communication:  Status is: Inpatient  Remains inpatient appropriate because:Inpatient level of care appropriate due to severity of illness   Dispo: The patient is from: Home              Anticipated d/c is to: Home              Anticipated d/c date is: 3 days              Patient currently is not medically stable to d/c.   Consultants:  GYN  Objective: Blood pressure 105/73, pulse 77, temperature 98.4 F (36.9 C), temperature source Oral, resp. rate 18, height 5\' 4"  (1.626 m), weight 95.3  kg, SpO2 95 %. No intake or output data in the 24 hours ending 09/07/20 0851 Filed Weights   09/02/20 0500  Weight: 95.3 kg    Examination: General: No acute respiratory distress Lungs: Fine scattered crackles bilaterally w/o wheezing - improved air movement  Cardiovascular: RRR  Abdomen: NT/ND, soft, BS positive, no rebound Extremities: trace edema B lower extremities    CBC: Recent Labs  Lab 09/05/20 0213 09/06/20 0236 09/07/20 0442  WBC 8.3 7.8 8.4  NEUTROABS 6.8 6.0 6.1  HGB 12.4 12.6 13.5  HCT 38.9 38.8 41.1  MCV 83.7 83.3 83.4  PLT 311 327 364   Basic Metabolic Panel: Recent Labs  Lab 09/04/20 0300 09/04/20 0300 09/05/20 0213 09/06/20 0236 09/07/20 0442  NA 138   < > 140 137 141  K 4.1   < >  4.5 4.1 4.2  CL 103   < > 102 98 99  CO2 25   < > 25 27 31   GLUCOSE 298*   < > 304* 277* 102*  BUN 24*   < > 25* 22* 19  CREATININE 0.65   < > 0.71 0.65 0.56  CALCIUM 8.6*   < > 9.1 9.0 9.1  MG 2.3  --   --   --   --    < > = values in this interval not displayed.   GFR: Estimated Creatinine Clearance: 104.6 mL/min (by C-G formula based on SCr of 0.56 mg/dL).  Liver Function Tests: Recent Labs  Lab 09/04/20 0300 09/05/20 0213 09/06/20 0236 09/07/20 0442  AST 21 23 18 16   ALT 31 31 25 21   ALKPHOS 40 45 47 47  BILITOT 0.4 0.2* 0.3 0.1*  PROT 6.3* 6.2* 6.1* 6.3*  ALBUMIN 2.4* 2.6* 2.7* 2.6*   Recent Labs  Lab 09/02/20 0143  LIPASE 39    HbA1C: Hemoglobin A1C  Date/Time Value Ref Range Status  10/10/2014 04:42 AM 10.4 (H) 4.2 - 6.3 % Final    Comment:    The American Diabetes Association recommends that a primary goal of therapy should be <7% and that physicians should reevaluate the treatment regimen in patients with HbA1c values consistently >8%.   09/12/2013 07:06 AM 9.4 (H) 4.2 - 6.3 % Final    Comment:    The American Diabetes Association recommends that a primary goal of therapy should be <7% and that physicians should reevaluate the treatment regimen in patients with HbA1c values consistently >8%.    Hgb A1c MFr Bld  Date/Time Value Ref Range Status  09/02/2020 05:40 AM 10.9 (H) 4.8 - 5.6 % Final    Comment:    (NOTE) Pre diabetes:          5.7%-6.4%  Diabetes:              >6.4%  Glycemic control for   <7.0% adults with diabetes   07/11/2019 02:52 PM 7.6 (H) <5.7 % of total Hgb Final    Comment:    For someone without known diabetes, a hemoglobin A1c value of 6.5% or greater indicates that they may have  diabetes and this should be confirmed with a follow-up  test. . For someone with known diabetes, a value <7% indicates  that their diabetes is well controlled and a value  greater than or equal to 7% indicates suboptimal  control. A1c targets  should be individualized based on  duration of diabetes, age, comorbid conditions, and  other considerations. . Currently, no consensus exists regarding use of hemoglobin A1c  for diagnosis of diabetes for children. .     CBG: Recent Labs  Lab 09/06/20 0726 09/06/20 1144 09/06/20 1645 09/06/20 2044 09/07/20 0757  GLUCAP 158* 153* 290* 295* 76    Recent Results (from the past 240 hour(s))  Respiratory Panel by RT PCR (Flu A&B, Covid) - Nasopharyngeal Swab     Status: Abnormal   Collection Time: 09/02/20  3:31 AM   Specimen: Nasopharyngeal Swab  Result Value Ref Range Status   SARS Coronavirus 2 by RT PCR POSITIVE (A) NEGATIVE Final    Comment: RESULT CALLED TO, READ BACK BY AND VERIFIED WITH: Conception Chancy RN 09/02/20 0421 JDW (NOTE) SARS-CoV-2 target nucleic acids are DETECTED.  SARS-CoV-2 RNA is generally detectable in upper respiratory specimens  during the acute phase of infection. Positive results are indicative of the presence of the identified virus, but do not rule out bacterial infection or co-infection with other pathogens not detected by the test. Clinical correlation with patient history and other diagnostic information is necessary to determine patient infection status. The expected result is Negative.  Fact Sheet for Patients:  https://www.moore.com/  Fact Sheet for Healthcare Providers: https://www.young.biz/  This test is not yet approved or cleared by the [place] FDA and  has been authorized for detection and/or diagnosis of SARS-CoV-2 by FDA under an Emergency Use Authorization (EUA).  This EUA will remain in effect (meaning this test can be use d) for the duration of  the COVID-19 declaration under Section 564(b)(1) of the Act, 21 U.S.C. section 360bbb-3(b)(1), unless the authorization is terminated or revoked sooner.      Influenza A by PCR NEGATIVE NEGATIVE Final   Influenza B by PCR NEGATIVE NEGATIVE  Final    Comment: (NOTE) The Xpert Xpress SARS-CoV-2/FLU/RSV assay is intended as an aid in  the diagnosis of influenza from Nasopharyngeal swab specimens and  should not be used as a sole basis for treatment. Nasal washings and  aspirates are unacceptable for Xpert Xpress SARS-CoV-2/FLU/RSV  testing.  Fact Sheet for Patients: https://www.moore.com/  Fact Sheet for Healthcare Providers: https://www.young.biz/  This test is not yet approved or cleared by the [place] FDA and  has been authorized for detection and/or diagnosis of SARS-CoV-2 by  FDA under an Emergency Use Authorization (EUA). This EUA will remain  in effect (meaning this test can be used) for the duration of the  Covid-19 declaration under Section 564(b)(1) of the Act, 21  U.S.C. section 360bbb-3(b)(1), unless the authorization is  terminated or revoked. Performed at [facility], [address], [place], [place] [address]   Culture, blood (routine x 2)     Status: None   Collection Time: 09/02/20  9:49 AM   Specimen: BLOOD RIGHT HAND  Result Value Ref Range Status   Specimen Description BLOOD RIGHT HAND  Final   Special Requests   Final    BOTTLES DRAWN AEROBIC ONLY Blood Culture results may not be optimal due to an inadequate volume of blood received in culture bottles   Culture   Final    NO GROWTH 5 DAYS Performed at [facility], [address], [place], [place] [address]    Report Status 09/07/2020 FINAL  Final  Culture, blood (routine x 2)     Status: None   Collection Time: 09/02/20  9:50 AM   Specimen: BLOOD RIGHT ARM  Result Value Ref Range Status   Specimen Description BLOOD RIGHT ARM  Final   Special Requests   Final  BOTTLES DRAWN AEROBIC ONLY Blood Culture results may not be optimal due to an inadequate volume of blood received in culture bottles   Culture   Final    NO GROWTH 5 DAYS Performed at [facility], [address]  [place], [place] [address]    Report Status 09/07/2020 FINAL  Final     Scheduled Meds: . vitamin C  500 mg Oral Daily  . cephALEXin  500 mg Oral Q6H  . cholecalciferol  1,000 Units Oral Daily  . enoxaparin (LOVENOX) injection  40 mg Subcutaneous Q24H  . fenofibrate  160 mg Oral Daily  . gabapentin  300 mg Oral QHS  . influenza vac split quadrivalent PF  0.5 mL Intramuscular Tomorrow-1000  . insulin aspart  0-20 Units Subcutaneous TID WC  . insulin aspart  0-5 Units Subcutaneous QHS  . insulin aspart  10 Units Subcutaneous TID WC  . insulin glargine  58 Units Subcutaneous BID  . linagliptin  5 mg Oral Daily  . predniSONE  40 mg Oral Q breakfast   Followed by  . [START ON 09/09/2020] predniSONE  20 mg Oral Q breakfast   Followed by  . [START ON 09/11/2020] predniSONE  10 mg Oral Q breakfast  . zinc sulfate  220 mg Oral Daily      LOS: 5 days   Lonia BloodJeffrey T. Jason Hauge, MD [facility] Office  [phone] Pager - Text Page per Loretha StaplerAmion  If 7PM-7AM, please contact night-coverage per Amion 09/07/2020, 8:51 AM

## 2020-09-08 DIAGNOSIS — N751 Abscess of Bartholin's gland: Secondary | ICD-10-CM

## 2020-09-08 LAB — GLUCOSE, CAPILLARY
Glucose-Capillary: 121 mg/dL — ABNORMAL HIGH (ref 70–99)
Glucose-Capillary: 89 mg/dL (ref 70–99)
Glucose-Capillary: 257 mg/dL — ABNORMAL HIGH (ref 70–99)
Glucose-Capillary: 250 mg/dL — ABNORMAL HIGH (ref 70–99)

## 2020-09-08 MED ORDER — ATORVASTATIN CALCIUM 10 MG PO TABS
20.0000 mg | ORAL_TABLET | Freq: Every day | ORAL | Status: DC
  Administered 2020-09-08 – 2020-09-10 (×3): 20 mg via ORAL
  Filled 2020-09-08 (×3): qty 2

## 2020-09-08 MED ORDER — HYDROMORPHONE HCL 1 MG/ML IJ SOLN
1.0000 mg | INTRAMUSCULAR | Status: DC | PRN
  Administered 2020-09-08: 1 mg via INTRAVENOUS
  Filled 2020-09-08: qty 1

## 2020-09-08 MED ORDER — SODIUM CHLORIDE 0.9 % IV SOLN: INTRAVENOUS | Status: DC

## 2020-09-08 MED ORDER — METFORMIN HCL 500 MG PO TABS
1000.0000 mg | ORAL_TABLET | Freq: Two times a day (BID) | ORAL | Status: DC
  Administered 2020-09-09 – 2020-09-10 (×3): 1000 mg via ORAL
  Filled 2020-09-08 (×3): qty 2

## 2020-09-08 MED ORDER — INSULIN ASPART 100 UNIT/ML ~~LOC~~ SOLN
12.0000 [IU] | Freq: Three times a day (TID) | SUBCUTANEOUS | Status: DC
  Administered 2020-09-08 – 2020-09-09 (×2): 12 [IU] via SUBCUTANEOUS

## 2020-09-08 NOTE — Progress Notes (Signed)
Patient ID: [name], female   DOB: 1980-12-06, 39 y.o.   MRN: [id number] GYN consult  S: Pt feels much better today. Pain better control. Has had only 2 IV injections of pain medication in the last 24 hours.   P: AF VSS GU: left bartholin cyst, less tender, smaller today, no fluctuance  A/P Left Bartholin cyst  Improved from yesterday. Will adjust IV pain medication. Encouraged pt to use oral pain medication. Continue with IV Ancef. No need for I & D at this time. If continues to improve hopefully will be able to avoid I & D.  Will continue to follow with you.

## 2020-09-08 NOTE — Progress Notes (Signed)
Patient requested her PRN Pain med Dilaudid 2 mg IV BP was noted slightly low, On call MD was notified about giving patient Dilaudid and was okay to administer and monitor BP.

## 2020-09-08 NOTE — Plan of Care (Signed)

## 2020-09-08 NOTE — Progress Notes (Addendum)
[name]  LKG:[id number] DOB: 11/13/1981 DOA: 09/02/2020 PCP: Doreene Nest, NP    Brief Narrative:  [phone] with a history of HTN, HLD, and DM2 who presented with SOB after feeling poorly for 2 weeks.  Other complaints included fever, cough, congestion, nausea, poor appetite, and loss of taste.  On presentation to the ED she was found to have an oxygen saturation of 80% on room air and confirmed to be CoViD positive.  CXR noted hazy bilateral airspace opacities consistent with CoViD pneumonia.  Significant Events:  10/6 admit via ED  Date of Positive COVID Test:  10/6  Vaccination Status: Unvaccinated against Covid  COVID-19 specific Treatment: Remdesivir 10/6 > 10/10 Steroid 10/5 >  Antimicrobials:  Keflex 10/9 > 10/11 Cephazolin 10/11 > 10/14  DVT prophylaxis: Lovenox  Subjective: Afebrile. BP borderline low at 90-100. Has been successfully weaned to 2L conventional [place] O2 support. Reports pain related to Bartholin's abscess is slightly improved today. No chest pain nausea or vomiting.  Assessment & Plan:  COVID Pneumonia - acute hypoxic respiratory failure - sepsis ruled out Continue steroid to complete 10 days of tx - has completed a course of remdesivir- has not required dosing with other immunosuppressive medications - no evidence of overlying bacterial infection -mobilization and assessment of saturations with mobilization has been inhibited due to severe pain related to the Bartholin's abscess -with pain improving hopefully she can be ambulated tomorrow to allow [place] to assess her ambulatory saturations to determine if discharge home soon is possible  Recent Labs  Lab 09/02/20 0540 09/02/20 0540 09/03/20 0418 09/04/20 0300 09/05/20 0213 09/06/20 0236 09/07/20 0442  DDIMER 0.92*   < > 0.69* 0.49 0.53* 0.65* 0.43  FERRITIN 162  --  173 136 105 90  --   CRP 11.6*  --  10.2* 6.4*  --   --  1.2*  ALT  --    < > 36 31 31 25 21   PROCALCITON <0.10  --   --   --    --   --   --    < > = values in this interval not displayed.    Nausea and vomiting - Covid gastroenteritis No evidence of pancreatitis - pregnancy test negative - resolved   Left Bartholin's abscess  Noted at time of admit exam -GYN is following for timing of I&D - antibiotic tx continues and is now being dosed IV -pain somewhat improved today -this is preventing her ambulation which is extending her hospital stay  HTN Now borderline hypotensive, likely due to poor intake and pain meds - gently hydrate and follow   HLD Resume usual home medical therapies  DM 2 Has been poorly controlled with use of steroids and with active Covid infection -CBG remains improved but still quite variable - adjust tx again today and follow   Code Status: FULL CODE Family Communication:  Status is: Inpatient  Remains inpatient appropriate because:Inpatient level of care appropriate due to severity of illness   Dispo: The patient is from: Home              Anticipated d/c is to: Home              Anticipated d/c date is: 3 days              Patient currently is not medically stable to d/c.   Consultants:  GYN  Objective: Blood pressure (!) 98/59, pulse 91, temperature 98.1 F (36.7 C), temperature source Oral,  resp. rate 18, height 5\' 4"  (1.626 m), weight 95.3 kg, SpO2 93 %.  Intake/Output Summary (Last 24 hours) at 09/08/2020 0857 Last data filed at 09/08/2020 0316 Gross per 24 hour  Intake 230 ml  Output --  Net 230 ml   Filed Weights   09/02/20 0500  Weight: 95.3 kg    Examination: General: No acute respiratory distress -alert and pleasant Lungs: Fine scattered crackles bilaterally w/o wheezing  Cardiovascular: RRR  Abdomen: NT/ND, soft, BS positive, no rebound Extremities: trace edema B LE   CBC: Recent Labs  Lab 09/05/20 0213 09/06/20 0236 09/07/20 0442  WBC 8.3 7.8 8.4  NEUTROABS 6.8 6.0 6.1  HGB 12.4 12.6 13.5  HCT 38.9 38.8 41.1  MCV 83.7 83.3 83.4  PLT 311 327  364   Basic Metabolic Panel: Recent Labs  Lab 09/04/20 0300 09/04/20 0300 09/05/20 0213 09/06/20 0236 09/07/20 0442  NA 138   < > 140 137 141  K 4.1   < > 4.5 4.1 4.2  CL 103   < > 102 98 99  CO2 25   < > 25 27 31   GLUCOSE 298*   < > 304* 277* 102*  BUN 24*   < > 25* 22* 19  CREATININE 0.65   < > 0.71 0.65 0.56  CALCIUM 8.6*   < > 9.1 9.0 9.1  MG 2.3  --   --   --   --    < > = values in this interval not displayed.   GFR: Estimated Creatinine Clearance: 104.6 mL/min (by C-G formula based on SCr of 0.56 mg/dL).  Liver Function Tests: Recent Labs  Lab 09/04/20 0300 09/05/20 0213 09/06/20 0236 09/07/20 0442  AST 21 23 18 16   ALT 31 31 25 21   ALKPHOS 40 45 47 47  BILITOT 0.4 0.2* 0.3 0.1*  PROT 6.3* 6.2* 6.1* 6.3*  ALBUMIN 2.4* 2.6* 2.7* 2.6*   Recent Labs  Lab 09/02/20 0143  LIPASE 39    HbA1C: Hemoglobin A1C  Date/Time Value Ref Range Status  10/10/2014 04:42 AM 10.4 (H) 4.2 - 6.3 % Final    Comment:    The American Diabetes Association recommends that a primary goal of therapy should be <7% and that physicians should reevaluate the treatment regimen in patients with HbA1c values consistently >8%.   09/12/2013 07:06 AM 9.4 (H) 4.2 - 6.3 % Final    Comment:    The American Diabetes Association recommends that a primary goal of therapy should be <7% and that physicians should reevaluate the treatment regimen in patients with HbA1c values consistently >8%.    Hgb A1c MFr Bld  Date/Time Value Ref Range Status  09/02/2020 05:40 AM 10.9 (H) 4.8 - 5.6 % Final    Comment:    (NOTE) Pre diabetes:          5.7%-6.4%  Diabetes:              >6.4%  Glycemic control for   <7.0% adults with diabetes   07/11/2019 02:52 PM 7.6 (H) <5.7 % of total Hgb Final    Comment:    For someone without known diabetes, a hemoglobin A1c value of 6.5% or greater indicates that they may have  diabetes and this should be confirmed with a follow-up  test. . For someone  with known diabetes, a value <7% indicates  that their diabetes is well controlled and a value  greater than or equal to 7% indicates suboptimal  control. A1c  targets should be individualized based on  duration of diabetes, age, comorbid conditions, and  other considerations. . Currently, no consensus exists regarding use of hemoglobin A1c for diagnosis of diabetes for children. .     CBG: Recent Labs  Lab 09/06/20 2044 09/07/20 0757 09/07/20 1238 09/07/20 1605 09/07/20 2321  GLUCAP 295* 76 131* 225* 237*    Recent Results (from the past 240 hour(s))  Respiratory Panel by RT PCR (Flu A&B, Covid) - Nasopharyngeal Swab     Status: Abnormal   Collection Time: 09/02/20  3:31 AM   Specimen: Nasopharyngeal Swab  Result Value Ref Range Status   SARS Coronavirus 2 by RT PCR POSITIVE (A) NEGATIVE Final    Comment: RESULT CALLED TO, READ BACK BY AND VERIFIED WITH: Conception Chancy RN 09/02/20 0421 JDW (NOTE) SARS-CoV-2 target nucleic acids are DETECTED.  SARS-CoV-2 RNA is generally detectable in upper respiratory specimens  during the acute phase of infection. Positive results are indicative of the presence of the identified virus, but do not rule out bacterial infection or co-infection with other pathogens not detected by the test. Clinical correlation with patient history and other diagnostic information is necessary to determine patient infection status. The expected result is Negative.  Fact Sheet for Patients:  https://www.moore.com/  Fact Sheet for Healthcare Providers: https://www.young.biz/  This test is not yet approved or cleared by the [place] FDA and  has been authorized for detection and/or diagnosis of SARS-CoV-2 by FDA under an Emergency Use Authorization (EUA).  This EUA will remain in effect (meaning this test can be use d) for the duration of  the COVID-19 declaration under Section 564(b)(1) of the Act, 21 U.S.C.  section 360bbb-3(b)(1), unless the authorization is terminated or revoked sooner.      Influenza A by PCR NEGATIVE NEGATIVE Final   Influenza B by PCR NEGATIVE NEGATIVE Final    Comment: (NOTE) The Xpert Xpress SARS-CoV-2/FLU/RSV assay is intended as an aid in  the diagnosis of influenza from Nasopharyngeal swab specimens and  should not be used as a sole basis for treatment. Nasal washings and  aspirates are unacceptable for Xpert Xpress SARS-CoV-2/FLU/RSV  testing.  Fact Sheet for Patients: https://www.moore.com/  Fact Sheet for Healthcare Providers: https://www.young.biz/  This test is not yet approved or cleared by the [place] FDA and  has been authorized for detection and/or diagnosis of SARS-CoV-2 by  FDA under an Emergency Use Authorization (EUA). This EUA will remain  in effect (meaning this test can be used) for the duration of the  Covid-19 declaration under Section 564(b)(1) of the Act, 21  U.S.C. section 360bbb-3(b)(1), unless the authorization is  terminated or revoked. Performed at [facility], [address], [place], [place] [address]   Culture, blood (routine x 2)     Status: None   Collection Time: 09/02/20  9:49 AM   Specimen: BLOOD RIGHT HAND  Result Value Ref Range Status   Specimen Description BLOOD RIGHT HAND  Final   Special Requests   Final    BOTTLES DRAWN AEROBIC ONLY Blood Culture results may not be optimal due to an inadequate volume of blood received in culture bottles   Culture   Final    NO GROWTH 5 DAYS Performed at [facility], [address] [place], [place] [address]    Report Status 09/07/2020 FINAL  Final  Culture, blood (routine x 2)     Status: None   Collection Time: 09/02/20  9:50 AM  Specimen: BLOOD RIGHT ARM  Result Value Ref Range Status   Specimen Description BLOOD RIGHT ARM  Final   Special Requests   Final    BOTTLES DRAWN AEROBIC ONLY Blood Culture results may  not be optimal due to an inadequate volume of blood received in culture bottles   Culture   Final    NO GROWTH 5 DAYS Performed at [facility], [address] [place], [place] [address]    Report Status 09/07/2020 FINAL  Final     Scheduled Meds: . vitamin C  500 mg Oral Daily  . cholecalciferol  1,000 Units Oral Daily  . enoxaparin (LOVENOX) injection  40 mg Subcutaneous Q24H  . fenofibrate  160 mg Oral Daily  . gabapentin  300 mg Oral QHS  . influenza vac split quadrivalent PF  0.5 mL Intramuscular Tomorrow-1000  . insulin aspart  0-20 Units Subcutaneous TID WC  . insulin aspart  0-5 Units Subcutaneous QHS  . insulin aspart  10 Units Subcutaneous TID WC  . insulin glargine  58 Units Subcutaneous BID  . linagliptin  5 mg Oral Daily  . predniSONE  40 mg Oral Q breakfast   Followed by  . [START ON 09/09/2020] predniSONE  20 mg Oral Q breakfast   Followed by  . [START ON 09/11/2020] predniSONE  10 mg Oral Q breakfast  . zinc sulfate  220 mg Oral Daily    .  ceFAZolin (ANCEF) IV 1 g (09/07/20 2323)    LOS: 6 days   Lonia BloodJeffrey T. Owen Pratte, MD [facility] Office  [phone] Pager - Text Page per Amion  If 7PM-7AM, please contact night-coverage per Amion 09/08/2020, 8:57 AM

## 2020-09-08 NOTE — Progress Notes (Signed)
Patient's left bartholin cysts appears to have burst while patient was using restroom. Absorbant pad applied to area. OB/GYN on call contacted, instructed to have patient hold pressure to the area and reassess in an hour to see if bleeding has subsided.

## 2020-09-09 DIAGNOSIS — N907 Vulvar cyst: Secondary | ICD-10-CM

## 2020-09-09 LAB — GLUCOSE, CAPILLARY
Glucose-Capillary: 65 mg/dL — ABNORMAL LOW (ref 70–99)
Glucose-Capillary: 95 mg/dL (ref 70–99)
Glucose-Capillary: 146 mg/dL — ABNORMAL HIGH (ref 70–99)
Glucose-Capillary: 151 mg/dL — ABNORMAL HIGH (ref 70–99)
Glucose-Capillary: 177 mg/dL — ABNORMAL HIGH (ref 70–99)

## 2020-09-09 MED ORDER — INSULIN GLARGINE 100 UNIT/ML ~~LOC~~ SOLN
50.0000 [IU] | Freq: Two times a day (BID) | SUBCUTANEOUS | Status: DC
  Administered 2020-09-09 – 2020-09-10 (×2): 50 [IU] via SUBCUTANEOUS
  Filled 2020-09-09 (×3): qty 0.5

## 2020-09-09 MED ORDER — CEPHALEXIN 500 MG PO CAPS: 500.0000 mg | ORAL_CAPSULE | Freq: Three times a day (TID) | ORAL | Status: DC

## 2020-09-09 MED ORDER — INSULIN ASPART 100 UNIT/ML ~~LOC~~ SOLN
10.0000 [IU] | Freq: Three times a day (TID) | SUBCUTANEOUS | Status: DC
  Administered 2020-09-09: 10 [IU] via SUBCUTANEOUS

## 2020-09-09 NOTE — Progress Notes (Signed)
Inpatient Diabetes Program Recommendations  AACE/ADA: New Consensus Statement on Inpatient Glycemic Control (2015)  Target Ranges:  Prepandial:   less than 140 mg/dL      Peak postprandial:   less than 180 mg/dL (1-2 hours)      Critically ill patients:  140 - 180 mg/dL   Lab Results  Component Value Date   GLUCAP 95 09/09/2020   HGBA1C 10.9 (H) 09/02/2020    Review of Glycemic Control Results for [name], [name] (MRN [id number]) as of 09/09/2020 11:48  Ref. Range 09/08/2020 12:41 09/08/2020 16:24 09/08/2020 21:33 09/09/2020 08:14 09/09/2020 08:45  Glucose-Capillary Latest Ref Range: 70 - 99 mg/dL 89 027 (H) 253 (H) 65 (L) 95  Diabetes history: Type 2 DM Outpatient Diabetes medications: Lantus 40 units BID, Novolin R 28 units TID, Metformin 1000 mg BID Current orders for Inpatient glycemic control: Lantus 58 units BID, Novolog 12 units TID, Novolog 0-20 units TID, Novolog 0-5 units QHS, Tradjenta 5 mg QD  Inpatient Diabetes Program Recommendations:    Note low blood sugar this morning.  Please consider reducing Lantus to 50 units bid.  Also consider reducing Novolog meal coverage back down to 10 units tid with meals (hold if patient eats less than 50%).    Thanks,  Beryl Meager, RN, BC-ADM Inpatient Diabetes Coordinator Pager [phone] (8a-5p)

## 2020-09-09 NOTE — Progress Notes (Signed)
SATURATION QUALIFICATIONS:   Patient Saturations on Room Air at Rest = 93%  Patient Saturations on Room Air while Ambulating = 86%  Patient Saturations on 1  Liters of oxygen while Ambulating = 89%  Please briefly explain why patient needs home oxygen:

## 2020-09-09 NOTE — Progress Notes (Signed)
PROGRESS NOTE    [name]  SNK:[id number] DOB: 03-13-1981 DOA: 09/02/2020 PCP: Doreene Nest, NP    Brief Narrative:  [name] is a 39 year old female with past medical history notable for essential hypertension, hyperlipidemia, type 2 diabetes mellitus who presented to the ED with shortness of breath.  Patient also with associated fever, cough, congestion, nausea, poor appetite and loss of taste.  On presentation the ED, patient was noted to be hypoxic with SPO2 80% on room air and confirmed to be Covid-19+.  Chest x-ray with noted hazy bilateral airspace opacity consistent with viral multifocal pneumonia.  Hospital service was consulted for further evaluation and treatment of acute hypoxic respiratory failure secondary to Covid-19 pneumonia.   Assessment & Plan:   Principal Problem:   Pneumonia due to COVID-19 virus Active Problems:   Essential hypertension   Sepsis (HCC)   Acute hypoxemic respiratory failure (HCC)   Nausea and vomiting   Abscess of left Bartholin's gland   Acute hypoxic respiratory failure secondary to acute Covid-19 viral pneumonia during the ongoing 2020/2021 Covid 19 Pandemic - POA Patient presenting with progressive shortness of breath.  Patient was found to be hypoxic with SPO2 80% on room air.  Chest x-ray with bilateral hazy airspace opacities consistent with Covid-19 viral pneumonia. --COVID test: + 09/02/2020 --CRP 11.6>10.2>6.4>1.2 --ddimer 0.92>0.69>0.49>0.53>0.65>0.43 --Completed 5-day course of remdesivir on 09/06/2020 --Continue prednisone 20 mg p.o. daily --prone for 2-3hrs every 12hrs if able --Continue supplemental oxygen, titrate to maintain SPO2 greater than 92%, currently on 1 L nasal cannula with SPO2 95% --Continue supportive care with albuterol MDI prn, vitamin C, zinc, Tylenol, antitussives (benzonatate/ Mucinex/Tussionex) --Follow CBC, CMP, D-dimer, ferritin, and CRP daily --Continue airborne/contact isolation precautions  for 3 weeks from the day of diagnosis  The treatment plan and use of medications and known side effects were discussed with patient/family. Some of the medications used are based on case reports/anecdotal data.  All other medications being used in the management of COVID-19 based on limited study data.  Complete risks and long-term side effects are unknown, however in the best clinical judgment they seem to be of some benefit.  Patient wanted to proceed with treatment options provided.  Bartholin cyst/abscess --OB/GYN following, appreciate assistance --Cefazolin 1 g IV every 8 hours  Essential hypertension On lisinopril 5 mg p.o. daily at home. --BP 119/72. --Hold home lisinopril and blood pressure.  Hyperlipidemia: Atorvastatin 20 mg p.o. daily, fenofibrate 160 mg p.o. daily  Type 2 diabetes mellitus: On Lantus 40 units twice daily, Novolin R 20 units 3 times daily AC, Metformin 1000 mg p.o. twice daily at home.  Hemoglobin A1c 10.9, poorly controlled. --Lantus 50 units subcutaneously BID --Novolog 10u TIDAC --Tradjenta 5 mg p.o. daily --Metformin 1000 mg p.o. twice daily --ISS for coverage --CBGs qAC/HS   DVT prophylaxis: Lovenox Code Status: Full code Family Communication: Updated patient extensively at bedside  Disposition Plan:  Status is: Inpatient  Remains inpatient appropriate because:IV treatments appropriate due to intensity of illness or inability to take PO and Inpatient level of care appropriate due to severity of illness   Dispo: The patient is from: Home              Anticipated d/c is to: Home              Anticipated d/c date is: 1 day              Patient currently is not medically stable to d/c.  Consultants:  OB/GYN  Procedures:   None  Antimicrobials:   Keflex 10/9 - 10/11  Cefazolin 10/11>>   Subjective: Patient seen and examined at bedside, resting comfortably.  States pain is improved now that cyst/abscess has now begun to drain  spontaneously.  No other questions or concerns at this time.  Denies headache, no fever/chills/night sweats, no nausea/vomiting/diarrhea, no chest pain, no palpitations, no abdominal pain, no weakness, no fatigue, no paresthesias.  No acute events overnight per nursing staff.  Objective: Vitals:   09/08/20 0150 09/08/20 0912 09/08/20 2134 09/09/20 0814  BP: (!) 98/59 104/60 119/72 98/60  Pulse:  87 (!) 105 84  Resp:  18 17 20   Temp:  97.7 F (36.5 C) 98.2 F (36.8 C) 98.7 F (37.1 C)  TempSrc:      SpO2:  95% 95% 99%  Weight:      Height:        Intake/Output Summary (Last 24 hours) at 09/09/2020 1501 Last data filed at 09/09/2020 0350 Gross per 24 hour  Intake 1624.85 ml  Output --  Net 1624.85 ml   Filed Weights   09/02/20 0500  Weight: 95.3 kg    Examination:  General exam: Appears calm and comfortable  Respiratory system: Clear to auscultation. Respiratory effort normal.  On 1 L nasal cannula with SPO2 95% Cardiovascular system: S1 & S2 heard, RRR. No JVD, murmurs, rubs, gallops or clicks. No pedal edema. Gastrointestinal system: Abdomen is nondistended, soft and nontender. No organomegaly or masses felt. Normal bowel sounds heard. Central nervous system: Alert and oriented. No focal neurological deficits. Extremities: Symmetric 5 x 5 power. Skin: No rashes, lesions or ulcers Psychiatry: Judgement and insight appear normal. Mood & affect appropriate.     Data Reviewed: I have personally reviewed following labs and imaging studies  CBC: Recent Labs  Lab 09/03/20 0418 09/04/20 0300 09/05/20 0213 09/06/20 0236 09/07/20 0442  WBC 4.0 9.4 8.3 7.8 8.4  NEUTROABS 3.1 7.9* 6.8 6.0 6.1  HGB 12.0 12.5 12.4 12.6 13.5  HCT 37.1 38.6 38.9 38.8 41.1  MCV 82.8 82.7 83.7 83.3 83.4  PLT 206 289 311 327 364   Basic Metabolic Panel: Recent Labs  Lab 09/03/20 0418 09/04/20 0300 09/05/20 0213 09/06/20 0236 09/07/20 0442  NA 135 138 140 137 141  K 4.0 4.1 4.5 4.1  4.2  CL 101 103 102 98 99  CO2 22 25 25 27 31   GLUCOSE 363* 298* 304* 277* 102*  BUN 23* 24* 25* 22* 19  CREATININE 0.69 0.65 0.71 0.65 0.56  CALCIUM 8.4* 8.6* 9.1 9.0 9.1  MG  --  2.3  --   --   --    GFR: Estimated Creatinine Clearance: 104.6 mL/min (by C-G formula based on SCr of 0.56 mg/dL). Liver Function Tests: Recent Labs  Lab 09/03/20 0418 09/04/20 0300 09/05/20 0213 09/06/20 0236 09/07/20 0442  AST 31 21 23 18 16   ALT 36 31 31 25 21   ALKPHOS 42 40 45 47 47  BILITOT 0.3 0.4 0.2* 0.3 0.1*  PROT 6.6 6.3* 6.2* 6.1* 6.3*  ALBUMIN 2.4* 2.4* 2.6* 2.7* 2.6*   No results for input(s): LIPASE, AMYLASE in the last 168 hours. No results for input(s): AMMONIA in the last 168 hours. Coagulation Profile: No results for input(s): INR, PROTIME in the last 168 hours. Cardiac Enzymes: No results for input(s): CKTOTAL, CKMB, CKMBINDEX, TROPONINI in the last 168 hours. BNP (last 3 results) No results for input(s): PROBNP in the last 8760 hours. HbA1C: No  results for input(s): HGBA1C in the last 72 hours. CBG: Recent Labs  Lab 09/08/20 1624 09/08/20 2133 09/09/20 0814 09/09/20 0845 09/09/20 1220  GLUCAP 257* 250* 65* 95 146*   Lipid Profile: No results for input(s): CHOL, HDL, LDLCALC, TRIG, CHOLHDL, LDLDIRECT in the last 72 hours. Thyroid Function Tests: No results for input(s): TSH, T4TOTAL, FREET4, T3FREE, THYROIDAB in the last 72 hours. Anemia Panel: No results for input(s): VITAMINB12, FOLATE, FERRITIN, TIBC, IRON, RETICCTPCT in the last 72 hours. Sepsis Labs: No results for input(s): PROCALCITON, LATICACIDVEN in the last 168 hours.  Recent Results (from the past 240 hour(s))  Respiratory Panel by RT PCR (Flu A&B, Covid) - Nasopharyngeal Swab     Status: Abnormal   Collection Time: 09/02/20  3:31 AM   Specimen: Nasopharyngeal Swab  Result Value Ref Range Status   SARS Coronavirus 2 by RT PCR POSITIVE (A) NEGATIVE Final    Comment: RESULT CALLED TO, READ BACK BY  AND VERIFIED WITH: Conception ChancyY PATTERSON RN 09/02/20 0421 JDW (NOTE) SARS-CoV-2 target nucleic acids are DETECTED.  SARS-CoV-2 RNA is generally detectable in upper respiratory specimens  during the acute phase of infection. Positive results are indicative of the presence of the identified virus, but do not rule out bacterial infection or co-infection with other pathogens not detected by the test. Clinical correlation with patient history and other diagnostic information is necessary to determine patient infection status. The expected result is Negative.  Fact Sheet for Patients:  https://www.moore.com/https://www.fda.gov/media/142436/download  Fact Sheet for Healthcare Providers: https://www.young.biz/https://www.fda.gov/media/142435/download  This test is not yet approved or cleared by the [place] FDA and  has been authorized for detection and/or diagnosis of SARS-CoV-2 by FDA under an Emergency Use Authorization (EUA).  This EUA will remain in effect (meaning this test can be use d) for the duration of  the COVID-19 declaration under Section 564(b)(1) of the Act, 21 U.S.C. section 360bbb-3(b)(1), unless the authorization is terminated or revoked sooner.      Influenza A by PCR NEGATIVE NEGATIVE Final   Influenza B by PCR NEGATIVE NEGATIVE Final    Comment: (NOTE) The Xpert Xpress SARS-CoV-2/FLU/RSV assay is intended as an aid in  the diagnosis of influenza from Nasopharyngeal swab specimens and  should not be used as a sole basis for treatment. Nasal washings and  aspirates are unacceptable for Xpert Xpress SARS-CoV-2/FLU/RSV  testing.  Fact Sheet for Patients: https://www.moore.com/https://www.fda.gov/media/142436/download  Fact Sheet for Healthcare Providers: https://www.young.biz/https://www.fda.gov/media/142435/download  This test is not yet approved or cleared by the [place] FDA and  has been authorized for detection and/or diagnosis of SARS-CoV-2 by  FDA under an Emergency Use Authorization (EUA). This EUA will remain  in effect (meaning this test  can be used) for the duration of the  Covid-19 declaration under Section 564(b)(1) of the Act, 21  U.S.C. section 360bbb-3(b)(1), unless the authorization is  terminated or revoked. Performed at [facility], [address] [place], [place] [address]   Culture, blood (routine x 2)     Status: None   Collection Time: 09/02/20  9:49 AM   Specimen: BLOOD RIGHT HAND  Result Value Ref Range Status   Specimen Description BLOOD RIGHT HAND  Final   Special Requests   Final    BOTTLES DRAWN AEROBIC ONLY Blood Culture results may not be optimal due to an inadequate volume of blood received in culture bottles   Culture   Final    NO GROWTH 5 DAYS Performed at [facility], [address] [place], [place] [address]    Report Status 09/07/2020 FINAL  Final  Culture, blood (routine x 2)     Status: None   Collection Time: 09/02/20  9:50 AM   Specimen: BLOOD RIGHT ARM  Result Value Ref Range Status   Specimen Description BLOOD RIGHT ARM  Final   Special Requests   Final    BOTTLES DRAWN AEROBIC ONLY Blood Culture results may not be optimal due to an inadequate volume of blood received in culture bottles   Culture   Final    NO GROWTH 5 DAYS Performed at [facility], [address], [place], [place] [address]    Report Status 09/07/2020 FINAL  Final         Radiology Studies: No results found.      Scheduled Meds: . vitamin C  500 mg Oral Daily  . atorvastatin  20 mg Oral Daily  . cholecalciferol  1,000 Units Oral Daily  . enoxaparin (LOVENOX) injection  40 mg Subcutaneous Q24H  . fenofibrate  160 mg Oral Daily  . gabapentin  300 mg Oral QHS  . insulin aspart  0-20 Units Subcutaneous TID WC  . insulin aspart  0-5 Units Subcutaneous QHS  . insulin aspart  12 Units Subcutaneous TID WC  . insulin glargine  58 Units Subcutaneous BID  . linagliptin  5 mg Oral Daily  . metFORMIN  1,000 mg Oral BID WC  . predniSONE  20 mg Oral Q breakfast   Followed by  .  [START ON 09/11/2020] predniSONE  10 mg Oral Q breakfast  . zinc sulfate  220 mg Oral Daily   Continuous Infusions: . sodium chloride 60 mL/hr at 09/09/20 0350  .  ceFAZolin (ANCEF) IV 1 g (09/09/20 1351)     LOS: 7 days    Time spent: 38 minutes spent on chart review, discussion with nursing staff, consultants, updating family and interview/physical exam; more than 50% of that time was spent in counseling and/or coordination of care.    Alvira Philips [place], DO [facility] Available via Epic secure chat 7am-7pm After these hours, please refer to coverage provider listed on [email or web address] 09/09/2020, 3:01 PM

## 2020-09-09 NOTE — TOC Progression Note (Addendum)
Transition of Care [facility]) - Progression Note    Patient Details  Name: [name] MRN: [id number] Date of Birth: 1981-01-22  [facility] of [facility] CM/SW Contact  Beckie Busing, RN Phone Number: [phone]   09/09/2020, 12:10 PM  Clinical Narrative:    CM consulted for medication assistance on patient with no medical insurance. MATCH form has been completed to provide patient with 30 day supply of meds. MD please send meds to [facility] pharmacy. Message sent to MD to send meds to [facility] pharmacy. CM will continue to follow.        Expected Discharge Plan and Services                                                 Social Determinants of Health (SDOH) Interventions    Readmission Risk Interventions No flowsheet data found.

## 2020-09-09 NOTE — Progress Notes (Signed)
Patient ID: [name], female   DOB: 03/07/1981, 39 y.o.   MRN: [id number] GYN consult  S: Pt reports feeling much better since spontaneous rupture of cyst yesterday evening. Reports min discharge from cyst today. Started cycle. Min to no pain.  P: AF VSS GU left labial cyst almost completely resolved, min discharge, min tenderness  A/P Left Labial cyst, s/p spontaneous rupture  Doing well. Will d/c Ancef tomorrow. Start Keflex after Ancef for 7 days. Will make appt for follow up in clinic in 2-3 weeks Will sign off. Available if need. On behalf of [facility], I want to thank Dr [place] and his team for allowing [place] to participate in [name]'s care.

## 2020-09-10 ENCOUNTER — Other Ambulatory Visit (HOSPITAL_COMMUNITY): Payer: Self-pay | Admitting: Internal Medicine

## 2020-09-10 LAB — GLUCOSE, CAPILLARY: Glucose-Capillary: 69 mg/dL — ABNORMAL LOW (ref 70–99)

## 2020-09-10 MED ORDER — PREDNISONE 10 MG PO TABS: 10.0000 mg | ORAL_TABLET | Freq: Every day | ORAL | 0 refills | Status: DC

## 2020-09-10 MED ORDER — ASCORBIC ACID 500 MG PO TABS: 500.0000 mg | ORAL_TABLET | Freq: Every day | ORAL | 0 refills | Status: AC

## 2020-09-10 MED ORDER — CEPHALEXIN 500 MG PO CAPS: 500.0000 mg | ORAL_CAPSULE | Freq: Three times a day (TID) | ORAL | 0 refills | Status: DC

## 2020-09-10 MED ORDER — INSULIN GLARGINE 100 UNIT/ML SOLOSTAR PEN: 50.0000 [IU] | PEN_INJECTOR | Freq: Two times a day (BID) | SUBCUTANEOUS | 5 refills | Status: AC

## 2020-09-10 MED ORDER — ZINC SULFATE 220 (50 ZN) MG PO CAPS: 220.0000 mg | ORAL_CAPSULE | Freq: Every day | ORAL | 0 refills | Status: AC

## 2020-09-10 MED FILL — ZINC SULFATE 220 MG TABLET: 220 (50 ZN) | 30 days supply | Qty: 30 | Fill #0

## 2020-09-10 MED FILL — predniSONE 10 MG TABS: 10 | 1 days supply | Qty: 1 | Fill #0

## 2020-09-10 MED FILL — CEPHALEXIN 500 MG CAPS: 500 | 7 days supply | Qty: 21 | Fill #0

## 2020-09-10 MED FILL — VITAMIN C 500 MG TABLET: 500 | 30 days supply | Qty: 30 | Fill #0

## 2020-09-10 NOTE — Plan of Care (Signed)

## 2020-09-10 NOTE — Discharge Instructions (Signed)
10 Things You Can Do to Manage Your COVID-19 Symptoms at Home If you have possible or confirmed COVID-19: 1. Stay home from work and school. And stay away from other public places. If you must go out, avoid using any kind of public transportation, ridesharing, or taxis. 2. Monitor your symptoms carefully. If your symptoms get worse, call your healthcare provider immediately. 3. Get rest and stay hydrated. 4. If you have a medical appointment, call the healthcare provider ahead of time and tell them that you have or may have COVID-19. 5. For medical emergencies, call 911 and notify the dispatch personnel that you have or may have COVID-19. 6. Cover your cough and sneezes with a tissue or use the inside of your elbow. 7. Wash your hands often with soap and water for at least 20 seconds or clean your hands with an alcohol-based hand sanitizer that contains at least 60% alcohol. 8. As much as possible, stay in a specific room and away from other people in your home. Also, you should use a separate bathroom, if available. If you need to be around other people in or outside of the home, wear a mask. 9. Avoid sharing personal items with other people in your household, like dishes, towels, and bedding. 10. Clean all surfaces that are touched often, like counters, tabletops, and doorknobs. Use household cleaning sprays or wipes according to the label instructions. cdc.gov/coronavirus 05/29/2019 This information is not intended to replace advice given to you by your health care provider. Make sure you discuss any questions you have with your health care provider. Document Revised: 10/31/2019 Document Reviewed: 10/31/2019 Elsevier Patient Education  2020 Elsevier Inc.  COVID-19: How to Protect Yourself and Others Know how it spreads  There is currently no vaccine to prevent coronavirus disease 2019 (COVID-19).  The best way to prevent illness is to avoid being exposed to this virus.  The virus is  thought to spread mainly from person-to-person. ? Between people who are in close contact with one another (within about 6 feet). ? Through respiratory droplets produced when an infected person coughs, sneezes or talks. ? These droplets can land in the mouths or noses of people who are nearby or possibly be inhaled into the lungs. ? COVID-19 may be spread by people who are not showing symptoms. Everyone should Clean your hands often  Wash your hands often with soap and water for at least 20 seconds especially after you have been in a public place, or after blowing your nose, coughing, or sneezing.  If soap and water are not readily available, use a hand sanitizer that contains at least 60% alcohol. Cover all surfaces of your hands and rub them together until they feel dry.  Avoid touching your eyes, nose, and mouth with unwashed hands. Avoid close contact  Limit contact with others as much as possible.  Avoid close contact with people who are sick.  Put distance between yourself and other people. ? Remember that some people without symptoms may be able to spread virus. ? This is especially important for people who are at higher risk of getting very sick.www.cdc.gov/coronavirus/2019-ncov/need-extra-precautions/people-at-higher-risk.html Cover your mouth and nose with a mask when around others  You could spread COVID-19 to others even if you do not feel sick.  Everyone should wear a mask in public settings and when around people not living in their household, especially when social distancing is difficult to maintain. ? Masks should not be placed on young children under age 2, anyone who   has trouble breathing, or is unconscious, incapacitated or otherwise unable to remove the mask without assistance.  The mask is meant to protect other people in case you are infected.  Do NOT use a facemask meant for a Dietitian.  Continue to keep about 6 feet between yourself and others. The  mask is not a substitute for social distancing. Cover coughs and sneezes  Always cover your mouth and nose with a tissue when you cough or sneeze or use the inside of your elbow.  Throw used tissues in the trash.  Immediately wash your hands with soap and water for at least 20 seconds. If soap and water are not readily available, clean your hands with a hand sanitizer that contains at least 60% alcohol. Clean and disinfect  Clean AND disinfect frequently touched surfaces daily. This includes tables, doorknobs, light switches, countertops, handles, desks, phones, keyboards, toilets, faucets, and sinks. [email or web address].[email or web address]  If surfaces are dirty, clean them: Use detergent or soap and water prior to disinfection.  Then, use a household disinfectant. You can see a list of EPA-registered household disinfectants here. [email or web address].com 07/31/2019 This information is not intended to replace advice given to you by your health care provider. Make sure you discuss any questions you have with your health care provider. Document Revised: 08/08/2019 Document Reviewed: 06/06/2019 Elsevier Patient Education  2020 [place].   COVID-19 COVID-19 is a respiratory infection that is caused by a virus called severe acute respiratory syndrome coronavirus 2 (SARS-CoV-2). The disease is also known as coronavirus disease or novel coronavirus. In some people, the virus may not cause any symptoms. In others, it may cause a serious infection. The infection can get worse quickly and can lead to complications, such as:  Pneumonia, or infection of the lungs.  Acute respiratory distress syndrome or ARDS. This is a condition in which fluid build-up in the lungs prevents the lungs from filling with air and passing oxygen into the blood.  Acute respiratory failure. This is a condition in which there is not enough oxygen passing from the lungs to the body  or when carbon dioxide is not passing from the lungs out of the body.  Sepsis or septic shock. This is a serious bodily reaction to an infection.  Blood clotting problems.  Secondary infections due to bacteria or fungus.  Organ failure. This is when your body's organs stop working. The virus that causes COVID-19 is contagious. This means that it can spread from person to person through droplets from coughs and sneezes (respiratory secretions). What are the causes? This illness is caused by a virus. You may catch the virus by:  Breathing in droplets from an infected person. Droplets can be spread by a person breathing, speaking, singing, coughing, or sneezing.  Touching something, like a table or a doorknob, that was exposed to the virus (contaminated) and then touching your mouth, nose, or eyes. What increases the risk? Risk for infection You are more likely to be infected with this virus if you:  Are within 6 feet (2 meters) of a person with COVID-19.  Provide care for or live with a person who is infected with COVID-19.  Spend time in crowded indoor spaces or live in shared housing. Risk for serious illness You are more likely to become seriously ill from the virus if you:  Are 26 years of age or older. The higher your age, the more you are at risk for serious illness.  Live in a nursing home or long-term care  facility.  Have cancer.  Have a long-term (chronic) disease such as: ? Chronic lung disease, including chronic obstructive pulmonary disease or asthma. ? A long-term disease that lowers your body's ability to fight infection (immunocompromised). ? Heart disease, including heart failure, a condition in which the arteries that lead to the heart become narrow or blocked (coronary artery disease), a disease which makes the heart muscle thick, weak, or stiff (cardiomyopathy). ? Diabetes. ? Chronic kidney disease. ? Sickle cell disease, a condition in which red blood cells  have an abnormal "sickle" shape. ? Liver disease.  Are obese. What are the signs or symptoms? Symptoms of this condition can range from mild to severe. Symptoms may appear any time from 2 to 14 days after being exposed to the virus. They include:  A fever or chills.  A cough.  Difficulty breathing.  Headaches, body aches, or muscle aches.  Runny or stuffy (congested) nose.  A sore throat.  New loss of taste or smell. Some people may also have stomach problems, such as nausea, vomiting, or diarrhea. Other people may not have any symptoms of COVID-19. How is this diagnosed? This condition may be diagnosed based on:  Your signs and symptoms, especially if: ? You live in an area with a COVID-19 outbreak. ? You recently traveled to or from an area where the virus is common. ? You provide care for or live with a person who was diagnosed with COVID-19. ? You were exposed to a person who was diagnosed with COVID-19.  A physical exam.  Lab tests, which may include: ? Taking a sample of fluid from the back of your nose and throat (nasopharyngeal fluid), your nose, or your throat using a swab. ? A sample of mucus from your lungs (sputum). ? Blood tests.  Imaging tests, which may include, X-rays, CT scan, or ultrasound. How is this treated? At present, there is no medicine to treat COVID-19. Medicines that treat other diseases are being used on a trial basis to see if they are effective against COVID-19. Your health care provider will talk with you about ways to treat your symptoms. For most people, the infection is mild and can be managed at home with rest, fluids, and over-the-counter medicines. Treatment for a serious infection usually takes places in a hospital intensive care unit (ICU). It may include one or more of the following treatments. These treatments are given until your symptoms improve.  Receiving fluids and medicines through an IV.  Supplemental oxygen. Extra oxygen  is given through a tube in the nose, a face mask, or a hood.  Positioning you to lie on your stomach (prone position). This makes it easier for oxygen to get into the lungs.  Continuous positive airway pressure (CPAP) or bi-level positive airway pressure (BPAP) machine. This treatment uses mild air pressure to keep the airways open. A tube that is connected to a motor delivers oxygen to the body.  Ventilator. This treatment moves air into and out of the lungs by using a tube that is placed in your windpipe.  Tracheostomy. This is a procedure to create a hole in the neck so that a breathing tube can be inserted.  Extracorporeal membrane oxygenation (ECMO). This procedure gives the lungs a chance to recover by taking over the functions of the heart and lungs. It supplies oxygen to the body and removes carbon dioxide. Follow these instructions at home: Lifestyle  If you are sick, stay home except to get medical care. Your health  care provider will tell you how long to stay home. Call your health care provider before you go for medical care.  Rest at home as told by your health care provider.  Do not use any products that contain nicotine or tobacco, such as cigarettes, e-cigarettes, and chewing tobacco. If you need help quitting, ask your health care provider.  Return to your normal activities as told by your health care provider. Ask your health care provider what activities are safe for you. General instructions  Take over-the-counter and prescription medicines only as told by your health care provider.  Drink enough fluid to keep your urine pale yellow.  Keep all follow-up visits as told by your health care provider. This is important. How is this prevented?  There is no vaccine to help prevent COVID-19 infection. However, there are steps you can take to protect yourself and others from this virus. To protect yourself:   Do not travel to areas where COVID-19 is a risk. The areas  where COVID-19 is reported change often. To identify high-risk areas and travel restrictions, check the CDC travel website: FatFares.com.br  If you live in, or must travel to, an area where COVID-19 is a risk, take precautions to avoid infection. ? Stay away from people who are sick. ? Wash your hands often with soap and water for 20 seconds. If soap and water are not available, use an alcohol-based hand sanitizer. ? Avoid touching your mouth, face, eyes, or nose. ? Avoid going out in public, follow guidance from your state and local health authorities. ? If you must go out in public, wear a cloth face covering or face mask. Make sure your mask covers your nose and mouth. ? Avoid crowded indoor spaces. Stay at least 6 feet (2 meters) away from others. ? Disinfect objects and surfaces that are frequently touched every day. This may include:  Counters and tables.  Doorknobs and light switches.  Sinks and faucets.  Electronics, such as phones, remote controls, keyboards, computers, and tablets. To protect others: If you have symptoms of COVID-19, take steps to prevent the virus from spreading to others.  If you think you have a COVID-19 infection, contact your health care provider right away. Tell your health care team that you think you may have a COVID-19 infection.  Stay home. Leave your house only to seek medical care. Do not use public transport.  Do not travel while you are sick.  Wash your hands often with soap and water for 20 seconds. If soap and water are not available, use alcohol-based hand sanitizer.  Stay away from other members of your household. Let healthy household members care for children and pets, if possible. If you have to care for children or pets, wash your hands often and wear a mask. If possible, stay in your own room, separate from others. Use a different bathroom.  Make sure that all people in your household wash their hands well and  often.  Cough or sneeze into a tissue or your sleeve or elbow. Do not cough or sneeze into your hand or into the air.  Wear a cloth face covering or face mask. Make sure your mask covers your nose and mouth. Where to find more information  Centers for Disease Control and Prevention: PurpleGadgets.be  World Health Organization: https://www.castaneda.info/ Contact a health care provider if:  You live in or have traveled to an area where COVID-19 is a risk and you have symptoms of the infection.  You have  had contact with someone who has COVID-19 and you have symptoms of the infection. Get help right away if:  You have trouble breathing.  You have pain or pressure in your chest.  You have confusion.  You have bluish lips and fingernails.  You have difficulty waking from sleep.  You have symptoms that get worse. These symptoms may represent a serious problem that is an emergency. Do not wait to see if the symptoms will go away. Get medical help right away. Call your local emergency services (911 in the [place] Do not drive yourself to the hospital. Let the emergency medical personnel know if you think you have COVID-19. Summary  COVID-19 is a respiratory infection that is caused by a virus. It is also known as coronavirus disease or novel coronavirus. It can cause serious infections, such as pneumonia, acute respiratory distress syndrome, acute respiratory failure, or sepsis.  The virus that causes COVID-19 is contagious. This means that it can spread from person to person through droplets from breathing, speaking, singing, coughing, or sneezing.  You are more likely to develop a serious illness if you are 40 years of age or older, have a weak immune system, live in a nursing home, or have chronic disease.  There is no medicine to treat COVID-19. Your health care provider will talk with you about ways to treat your symptoms.  Take steps to  protect yourself and others from infection. Wash your hands often and disinfect objects and surfaces that are frequently touched every day. Stay away from people who are sick and wear a mask if you are sick. This information is not intended to replace advice given to you by your health care provider. Make sure you discuss any questions you have with your health care provider. Document Revised: 09/13/2019 Document Reviewed: 12/20/2018 Elsevier Patient Education  2020 Elsevier Inc.  Bartholin's Cyst  A Bartholin's cyst is a fluid-filled sac that forms on a Bartholin's gland. Bartholin's glands are small glands in the folds of skin around the vaginal opening (labia). These glands produce a fluid to moisten (lubricate) the outside of the vagina during sex. A cyst that is not large or infected may not cause any problems or require treatment. If the cyst gets infected, it is called a Bartholin's abscess. An abscess may cause symptoms such as pain and swelling and is more likely to require treatment. What are the causes? This condition may be caused by a blocked Bartholin's gland. These glands can become blocked due to natural buildup of fluid and oils. Bacteria inside of the cyst can cause infection. In many cases, the cause is not known. What increases the risk? You may be at increased risk of developing a Bartholin's cyst or abscess if:  You are of childbearing age.  You have a history of Bartholin's cysts or abscesses.  You have diabetes.  You have an STI (sexually transmitted infection). What are the signs or symptoms? Symptoms may include:  A bulge or lump on the labia, near the lower opening of the vagina.  Discomfort or pain. This may get worse during sex or when walking.  Redness, swelling, or fluid draining from the area. These may be signs of an abscess. How severe your symptoms are depends on the size of your cyst and whether it is infected. Infection causes symptoms to get more  severe. How is this diagnosed? This condition may be diagnosed based on:  Your symptoms and medical history.  A physical exam to check for  swelling in your vaginal area. You may lie on your back on an exam table and have your feet placed into footrests for the exam.  Blood tests to check for infections.  Removal of a fluid sample from the cyst or abscess (biopsy) for testing. You may work with a health care provider who specializes in [employer] health (gynecologist) for diagnosis and treatment. How is this treated? If your cyst is small, not infected, and not causing symptoms, you may not need any treatment. These cysts often go away on their own, with home care such as hot baths or warm compresses. If you have a large cyst or an abscess, treatment may include:  Antibiotic medicine.  A procedure to drain the fluid inside the cyst or abscess. These procedures involve making an incision in the cyst or abscess so that the fluid drains out, and then one of the following may be done: ? A small, thin tube (catheter) may be placed inside the cyst or abscess so that it does not close and fill up with fluid again (fistulization). The catheter will be removed at a follow-up visit. ? The edges of the incision may be stitched to your skin so that the cyst or abscess stays open (marsupialization). This allows it to continue to drain and not fill up with fluid again. If you have cysts or abscesses that keep returning (recurring) and have required incision and drainage multiple times, your health care provider may talk with you about surgery to remove the Bartholin's gland. Follow these instructions at home: Medicines  Take over-the-counter and prescription medicines only as told by your health care provider.  If you were prescribed an antibiotic medicine, take it as told by your health care provider. Do not stop taking the antibiotic even if your condition improves. Managing pain and swelling  Try sitz  baths to help with pain and swelling. A sitz bath is a warm water bath in which the water only comes up to your hips and should cover your buttocks. You may take sitz baths several times a day.  Apply heat to the affected area as often as needed. Use the heat source that your health care provider recommends, such as a moist heat pack or a heating pad. ? Place a towel between your skin and the heat source. ? Leave the heat on for 20-30 minutes. ? Remove the heat if your skin turns bright red. This is especially important if you are unable to feel pain, heat, or cold. You may have a greater risk of getting burned. General instructions  If your cyst or abscess was drained, follow instructions from your health care provider about how to take care of your wound. Use feminine pads as needed to absorb any drainage.  Do not push on or squeeze your cyst.  Do not have sex until the cyst has gone away or your wound from drainage has healed.  Take these steps to help prevent a Bartholin's cyst from returning, and to prevent other Bartholin's cysts from developing: ? Take a bath or shower once a day. Clean your vaginal area with mild soap and water when you bathe. ? Practice safe sex to prevent STIs. Talk with your health care provider about how to prevent STIs and which forms of birth control (contraception) may be best for you.  Keep all follow-up visits as told by your health care provider. This is important. Contact a health care provider if:  You have a fever.  You develop redness,  swelling, or pain around your cyst.  You have fluid, blood, pus, or a bad smell coming from your cyst.  You have a cyst that gets larger or comes back. Summary  A Bartholin's cyst is a fluid-filled sac that forms on a Bartholin's gland. These glands are in the folds of skin around the vaginal opening (labia).  If your cyst is small, not infected, and not causing symptoms, you may not need any treatment. These cysts  often go away on their own, with home care such as hot baths or warm compresses.  If you have a large cyst or an abscess, your health care provider may perform a procedure to drain the fluid.  If you have cysts or abscesses that keep returning (recurring) and have required incision and drainage multiple times, your health care provider may talk with you about surgery to remove the Bartholin's gland. This information is not intended to replace advice given to you by your health care provider. Make sure you discuss any questions you have with your health care provider. Document Revised: 09/06/2018 Document Reviewed: 08/16/2017 Elsevier Patient Education  2020 Elsevier Inc.    Person Under Monitoring Name: [name]  Location: [address] [place] [address]   Infection Prevention Recommendations for Individuals Confirmed to have, or Being Evaluated for, 2019 Novel Coronavirus (COVID-19) Infection Who Receive Care at Home  Individuals who are confirmed to have, or are being evaluated for, COVID-19 should follow the prevention steps below until a healthcare provider or local or state health department says they can return to normal activities.  Stay home except to get medical care You should restrict activities outside your home, except for getting medical care. Do not go to work, school, or public areas, and do not use public transportation or taxis.  Call ahead before visiting your doctor Before your medical appointment, call the healthcare provider and tell them that you have, or are being evaluated for, COVID-19 infection. This will help the healthcare provider's office take steps to keep other people from getting infected. Ask your healthcare provider to call the local or state health department.  Monitor your symptoms Seek prompt medical attention if your illness is worsening (e.g., difficulty breathing). Before going to your medical appointment, call the  healthcare provider and tell them that you have, or are being evaluated for, COVID-19 infection. Ask your healthcare provider to call the local or state health department.  Wear a facemask You should wear a facemask that covers your nose and mouth when you are in the same room with other people and when you visit a healthcare provider. People who live with or visit you should also wear a facemask while they are in the same room with you.  Separate yourself from other people in your home As much as possible, you should stay in a different room from other people in your home. Also, you should use a separate bathroom, if available.  Avoid sharing household items You should not share dishes, drinking glasses, cups, eating utensils, towels, bedding, or other items with other people in your home. After using these items, you should wash them thoroughly with soap and water.  Cover your coughs and sneezes Cover your mouth and nose with a tissue when you cough or sneeze, or you can cough or sneeze into your sleeve. Throw used tissues in a lined trash can, and immediately wash your hands with soap and water for at least 20 seconds or use an alcohol-based hand  rub.  Wash your hands Wash your hands often and thoroughly with soap and water for at least 20 seconds. You can use an alcohol-based hand sanitizer if soap and water are not available and if your hands are not visibly dirty. Avoid touching your eyes, nose, and mouth with unwashed hands.   Prevention Steps for Caregivers and Household Members of Individuals Confirmed to have, or Being Evaluated for, COVID-19 Infection Being Cared for in the Home  If you live with, or provide care at home for, a person confirmed to have, or being evaluated for, COVID-19 infection please follow these guidelines to prevent infection:  Follow healthcare provider's instructions Make sure that you understand and can help the patient follow any healthcare  provider instructions for all care.  Provide for the patient's basic needs You should help the patient with basic needs in the home and provide support for getting groceries, prescriptions, and other personal needs.  Monitor the patient's symptoms If they are getting sicker, call his or her medical provider and tell them that the patient has, or is being evaluated for, COVID-19 infection. This will help the healthcare provider's office take steps to keep other people from getting infected. Ask the healthcare provider to call the local or state health department.  Limit the number of people who have contact with the patient  If possible, have only one caregiver for the patient.  Other household members should stay in another home or place of residence. If this is not possible, they should stay  in another room, or be separated from the patient as much as possible. Use a separate bathroom, if available.  Restrict visitors who do not have an essential need to be in the home.  Keep older adults, very young children, and other sick people away from the patient Keep older adults, very young children, and those who have compromised immune systems or chronic health conditions away from the patient. This includes people with chronic heart, lung, or kidney conditions, diabetes, and cancer.  Ensure good ventilation Make sure that shared spaces in the home have good air flow, such as from an air conditioner or an opened window, weather permitting.  Wash your hands often  Wash your hands often and thoroughly with soap and water for at least 20 seconds. You can use an alcohol based hand sanitizer if soap and water are not available and if your hands are not visibly dirty.  Avoid touching your eyes, nose, and mouth with unwashed hands.  Use disposable paper towels to dry your hands. If not available, use dedicated cloth towels and replace them when they become wet.  Wear a facemask and  gloves  Wear a disposable facemask at all times in the room and gloves when you touch or have contact with the patient's blood, body fluids, and/or secretions or excretions, such as sweat, saliva, sputum, nasal mucus, vomit, urine, or feces.  Ensure the mask fits over your nose and mouth tightly, and do not touch it during use.  Throw out disposable facemasks and gloves after using them. Do not reuse.  Wash your hands immediately after removing your facemask and gloves.  If your personal clothing becomes contaminated, carefully remove clothing and launder. Wash your hands after handling contaminated clothing.  Place all used disposable facemasks, gloves, and other waste in a lined container before disposing them with other household waste.  Remove gloves and wash your hands immediately after handling these items.  Do not share dishes, glasses, or other  household items with the patient  Avoid sharing household items. You should not share dishes, drinking glasses, cups, eating utensils, towels, bedding, or other items with a patient who is confirmed to have, or being evaluated for, COVID-19 infection.  After the person uses these items, you should wash them thoroughly with soap and water.  Wash laundry thoroughly  Immediately remove and wash clothes or bedding that have blood, body fluids, and/or secretions or excretions, such as sweat, saliva, sputum, nasal mucus, vomit, urine, or feces, on them.  Wear gloves when handling laundry from the patient.  Read and follow directions on labels of laundry or clothing items and detergent. In general, wash and dry with the warmest temperatures recommended on the label.  Clean all areas the individual has used often  Clean all touchable surfaces, such as counters, tabletops, doorknobs, bathroom fixtures, toilets, phones, keyboards, tablets, and bedside tables, every day. Also, clean any surfaces that may have blood, body fluids, and/or secretions or  excretions on them.  Wear gloves when cleaning surfaces the patient has come in contact with.  Use a diluted bleach solution (e.g., dilute bleach with 1 part bleach and 10 parts water) or a household disinfectant with a label that says EPA-registered for coronaviruses. To make a bleach solution at home, add 1 tablespoon of bleach to 1 quart (4 cups) of water. For a larger supply, add  cup of bleach to 1 gallon (16 cups) of water.  Read labels of cleaning products and follow recommendations provided on product labels. Labels contain instructions for safe and effective use of the cleaning product including precautions you should take when applying the product, such as wearing gloves or eye protection and making sure you have good ventilation during use of the product.  Remove gloves and wash hands immediately after cleaning.  Monitor yourself for signs and symptoms of illness Caregivers and household members are considered close contacts, should monitor their health, and will be asked to limit movement outside of the home to the extent possible. Follow the monitoring steps for close contacts listed on the symptom monitoring form.   ? If you have additional questions, contact your local health department or call the epidemiologist on call at [phone] (available 24/7). ? This guidance is subject to change. For the most up-to-date guidance from [facility], please refer to their website: [email or web address]

## 2020-09-10 NOTE — Discharge Summary (Addendum)
Physician Discharge Summary  LORETTA DOUTT ZOX:[id number] DOB: Dec 03, 1980 DOA: 09/02/2020  PCP: Doreene Nest, NP  Admit date: 09/02/2020 Discharge date: 09/10/2020  Admitted From: Home Disposition: Home  Recommendations for Outpatient Follow-up:  1. Follow up with PCP in 1-2 weeks 2. Follow-up with OB/GYN, Dr. Alysia Penna in 2-3 weeks 3. Continue Keflex 500 mg p.o. every 8 hours for additional 7 days for Bartholin cyst/abscess 4. Continue prednisone taper for 1 additional day 5. Recommend Covid-19 vaccination 45 days from diagnosis 6. Will likely need further titration of her diabetic regimen for poorly controlled diabetes  Home Health: No Equipment/Devices: None  Discharge Condition: Stable CODE STATUS: Full code Diet recommendation: Heart healthy/consistent carbohydrate diet  History of present illness:  [name] is a 39 year old female with past medical history notable for essential hypertension, hyperlipidemia, type 2 diabetes mellitus who presented to the ED with shortness of breath.  Patient also with associated fever, cough, congestion, nausea, poor appetite and loss of taste.  On presentation the ED, patient was noted to be hypoxic with SPO2 80% on room air and confirmed to be Covid-19+.  Chest x-ray with noted hazy bilateral airspace opacity consistent with viral multifocal pneumonia.  Hospital service was consulted for further evaluation and treatment of acute hypoxic respiratory failure secondary to Covid-19 pneumonia.  Hospital course:  Acute hypoxic respiratory failure secondary to acute Covid-19 viral pneumonia during the ongoing 2020/2021 Covid 19 Pandemic - POA Patient presenting with progressive shortness of breath.  Patient was found to be hypoxic with SPO2 80% on room air.  Chest x-ray with bilateral hazy airspace opacities consistent with Covid-19 viral pneumonia.  Positive Covid PCR 09/02/2020.  CRP on admission 11.6 with a D-dimer of 0.92.  Patient completed  5-day course of remdesivir on 09/06/2020.  Patient was started on IV steroids and was slowly tapered down at time of discharge.  Patient initially required supplemental oxygen which was weaned off during the hospitalization.  Continue supportive care following discharge with vitamin C, zinc.  Recommend Covid-19 vaccination 45 days from diagnosis.  Patient will need to maintain home isolation precautions for 14 days from diagnosis.  The treatment plan and use of medications and known side effects were discussed with patient/family. Some of the medications used are based on case reports/anecdotal data.  All other medications being used in the management of COVID-19 based on limited study data.  Complete risks and long-term side effects are unknown, however in the best clinical judgment they seem to be of some benefit.  Patient wanted to proceed with treatment options provided.  Bartholin cyst/abscess OB/GYN was consulted and followed during hospital course.  Patient was initially started on empiric antibiotics and was de-escalate to Keflex 500 mg p.o. every 8 hours to complete course.  Cyst/abscess ruptured during hospitalization.  Patient will need follow-up with Dr. Alysia Penna, OB/GYN in 2-3 weeks  Essential hypertension Continue lisinopril 5 mg p.o. daily at home.  Hyperlipidemia: Atorvastatin 20 mg p.o. daily, fenofibrate 160 mg p.o. daily  Type 2 diabetes mellitus: Hemoglobin A1c 10.9, poorly controlled.  Charged Lantus 50 units twice daily, Novolin R 28 units 3 times daily AC, Metformin 1000 mg p.o. twice daily at home.  Will need close follow-up with PCP for further guidance and diabetic regimen titration.  On ACE inhibitor.  Discharge Diagnoses:  Principal Problem:   Pneumonia due to COVID-19 virus Active Problems:   Essential hypertension   Abscess of left Bartholin's gland    Discharge Instructions  Discharge Instructions  Call MD for:  difficulty breathing, headache or visual  disturbances   Complete by: As directed    Call MD for:  extreme fatigue   Complete by: As directed    Call MD for:  persistant dizziness or light-headedness   Complete by: As directed    Call MD for:  persistant nausea and vomiting   Complete by: As directed    Call MD for:  severe uncontrolled pain   Complete by: As directed    Call MD for:  temperature >100.4   Complete by: As directed    Diet - low sodium heart healthy   Complete by: As directed    Increase activity slowly   Complete by: As directed      Allergies as of 09/10/2020   No Known Allergies     Medication List    TAKE these medications   ascorbic acid 500 MG tablet Commonly known as: VITAMIN C Take 1 tablet (500 mg total) by mouth daily. Start taking on: September 11, 2020   atorvastatin 20 MG tablet Commonly known as: LIPITOR Take 1 tablet (20 mg total) by mouth daily. For cholesterol.   cephALEXin 500 MG capsule Commonly known as: KEFLEX Take 1 capsule (500 mg total) by mouth every 8 (eight) hours for 7 days.   fenofibrate 145 MG tablet Commonly known as: Tricor Take 1 tablet (145 mg total) by mouth daily. For triglycerides.   gabapentin 300 MG capsule Commonly known as: NEURONTIN Take 1 capsule (300 mg total) by mouth at bedtime. For neuropathy.   insulin glargine 100 UNIT/ML Solostar Pen Commonly known as: LANTUS Inject 50 Units into the skin 2 (two) times daily. What changed:   how much to take  when to take this   insulin regular 100 units/mL injection Commonly known as: NOVOLIN R Inject 0.28 mLs (28 Units total) into the skin 3 (three) times daily before meals.   lisinopril 5 MG tablet Commonly known as: ZESTRIL Take 1 tablet (5 mg total) by mouth daily. For blood pressure and kidney protection.   metFORMIN 1000 MG tablet Commonly known as: GLUCOPHAGE Take 1 tablet (1,000 mg total) by mouth 2 (two) times daily with a meal.   predniSONE 10 MG tablet Commonly known as:  DELTASONE Take 1 tablet (10 mg total) by mouth daily with breakfast for 1 day. Start taking on: September 11, 2020   zinc sulfate 220 (50 Zn) MG capsule Take 1 capsule (220 mg total) by mouth daily. Start taking on: September 11, 2020       Follow-up Information    Doreene Nest, NP. Schedule an appointment as soon as possible for a visit in 1 week(s).   Specialty: Internal Medicine Contact information: [address] [place] [address] [phone]        Hermina Staggers, MD. Schedule an appointment as soon as possible for a visit in 3 week(s).   Specialty: Obstetrics and Gynecology Contact information: [address] [place] [address] [phone]              No Known Allergies  Consultations:  OB/GYN, Dr. Alysia Penna   Procedures/Studies: DG Chest Portable 1 View  Result Date: 09/02/2020 CLINICAL DATA:  Shortness of breath.  Cough.  Nausea and vomiting. EXAM: PORTABLE CHEST 1 VIEW COMPARISON:  Apr 06, 2016 FINDINGS: There are hazy bilateral airspace opacities. No pneumothorax. No large pleural effusion. The heart size is mildly enlarged. There is no acute osseous abnormality. IMPRESSION:  Hazy bilateral airspace opacities may represent developing atypical infectious process such as viral pneumonia. Electronically Signed   By: Katherine Mantle M.D.   On: 09/02/2020 01:58      Subjective: Patient seen and examined at bedside, resting comfortably. No complaints this morning. Ready for discharge home. Denies headache, no visual changes, no chest pain, no palpitations, no shortness of breath, no abdominal pain, no fever/chills/night sweats, no nausea/vomiting/diarrhea, no weakness, no fatigue, no paresthesias.  No acute events overnight per nursing staff.  Discharge Exam: Vitals:   09/09/20 2228 09/10/20 0816  BP: 102/62 102/70  Pulse: 83 79  Resp: 18 20  Temp: 98.1 F (36.7 C) 98.4 F (36.9 C)  SpO2: 94% 96%   Vitals:   09/09/20 0814  09/09/20 1706 09/09/20 2228 09/10/20 0816  BP: 98/60 126/75 102/62 102/70  Pulse: 84 92 83 79  Resp: 20 20 18 20   Temp: 98.7 F (37.1 C) 98.1 F (36.7 C) 98.1 F (36.7 C) 98.4 F (36.9 C)  TempSrc:   Oral   SpO2: 99% 95% 94% 96%  Weight:      Height:        General: Pt is alert, awake, not in acute distress Cardiovascular: RRR, S1/S2 +, no rubs, no gallops Respiratory: CTA bilaterally, no wheezing, no rhonchi, oxygenating well on room air. Abdominal: Soft, NT, ND, bowel sounds + Extremities: no edema, no cyanosis    The results of significant diagnostics from this hospitalization (including imaging, microbiology, ancillary and laboratory) are listed below for reference.     Microbiology: Recent Results (from the past 240 hour(s))  Respiratory Panel by RT PCR (Flu A&B, Covid) - Nasopharyngeal Swab     Status: Abnormal   Collection Time: 09/02/20  3:31 AM   Specimen: Nasopharyngeal Swab  Result Value Ref Range Status   SARS Coronavirus 2 by RT PCR POSITIVE (A) NEGATIVE Final    Comment: RESULT CALLED TO, READ BACK BY AND VERIFIED WITH: 11/02/20 RN 09/02/20 0421 JDW (NOTE) SARS-CoV-2 target nucleic acids are DETECTED.  SARS-CoV-2 RNA is generally detectable in upper respiratory specimens  during the acute phase of infection. Positive results are indicative of the presence of the identified virus, but do not rule out bacterial infection or co-infection with other pathogens not detected by the test. Clinical correlation with patient history and other diagnostic information is necessary to determine patient infection status. The expected result is Negative.  Fact Sheet for Patients:  11/02/20  Fact Sheet for Healthcare Providers: https://www.moore.com/  This test is not yet approved or cleared by the https://www.young.biz/ FDA and  has been authorized for detection and/or diagnosis of SARS-CoV-2 by FDA under an Emergency Use  Authorization (EUA).  This EUA will remain in effect (meaning this test can be use d) for the duration of  the COVID-19 declaration under Section 564(b)(1) of the Act, 21 U.S.C. section 360bbb-3(b)(1), unless the authorization is terminated or revoked sooner.      Influenza A by PCR NEGATIVE NEGATIVE Final   Influenza B by PCR NEGATIVE NEGATIVE Final    Comment: (NOTE) The Xpert Xpress SARS-CoV-2/FLU/RSV assay is intended as an aid in  the diagnosis of influenza from Nasopharyngeal swab specimens and  should not be used as a sole basis for treatment. Nasal washings and  aspirates are unacceptable for Xpert Xpress SARS-CoV-2/FLU/RSV  testing.  Fact Sheet for Patients: [place]  Fact Sheet for Healthcare Providers: https://www.moore.com/  This test is not yet approved or cleared by the https://www.young.biz/  FDA and  has been authorized for detection and/or diagnosis of SARS-CoV-2 by  FDA under an Emergency Use Authorization (EUA). This EUA will remain  in effect (meaning this test can be used) for the duration of the  Covid-19 declaration under Section 564(b)(1) of the Act, 21  U.S.C. section 360bbb-3(b)(1), unless the authorization is  terminated or revoked. Performed at [facility], [address], [place], [place] [address]   Culture, blood (routine x 2)     Status: None   Collection Time: 09/02/20  9:49 AM   Specimen: BLOOD RIGHT HAND  Result Value Ref Range Status   Specimen Description BLOOD RIGHT HAND  Final   Special Requests   Final    BOTTLES DRAWN AEROBIC ONLY Blood Culture results may not be optimal due to an inadequate volume of blood received in culture bottles   Culture   Final    NO GROWTH 5 DAYS Performed at [facility], [address], [place], [place] [address]    Report Status 09/07/2020 FINAL  Final  Culture, blood (routine x 2)     Status: None   Collection Time: 09/02/20  9:50 AM   Specimen:  BLOOD RIGHT ARM  Result Value Ref Range Status   Specimen Description BLOOD RIGHT ARM  Final   Special Requests   Final    BOTTLES DRAWN AEROBIC ONLY Blood Culture results may not be optimal due to an inadequate volume of blood received in culture bottles   Culture   Final    NO GROWTH 5 DAYS Performed at [facility], [address] [place], [place] [address]    Report Status 09/07/2020 FINAL  Final     Labs: BNP (last 3 results) No results for input(s): BNP in the last 8760 hours. Basic Metabolic Panel: Recent Labs  Lab 09/04/20 0300 09/05/20 0213 09/06/20 0236 09/07/20 0442  NA 138 140 137 141  K 4.1 4.5 4.1 4.2  CL 103 102 98 99  CO2 GLUCOSE 298* 304* 277* 102*  BUN 24* 25* 22* 19  CREATININE 0.65 0.71 0.65 0.56  CALCIUM 8.6* 9.1 9.0 9.1  MG 2.3  --   --   --    Liver Function Tests: Recent Labs  Lab 09/04/20 0300 09/05/20 0213 09/06/20 0236 09/07/20 0442  AST ALT ALKPHOS 40 45 47 47  BILITOT 0.4 0.2* 0.3 0.1*  PROT 6.3* 6.2* 6.1* 6.3*  ALBUMIN 2.4* 2.6* 2.7* 2.6*   No results for input(s): LIPASE, AMYLASE in the last 168 hours. No results for input(s): AMMONIA in the last 168 hours. CBC: Recent Labs  Lab 09/04/20 0300 09/05/20 0213 09/06/20 0236 09/07/20 0442  WBC 9.4 8.3 7.8 8.4  NEUTROABS 7.9* 6.8 6.0 6.1  HGB 12.5 12.4 12.6 13.5  HCT 38.6 38.9 38.8 41.1  MCV 82.7 83.7 83.3 83.4  PLT 289 311 327 364   Cardiac Enzymes: No results for input(s): CKTOTAL, CKMB, CKMBINDEX, TROPONINI in the last 168 hours. BNP: Invalid input(s): POCBNP CBG: Recent Labs  Lab 09/09/20 0845 09/09/20 1220 09/09/20 1708 09/09/20 2010 09/10/20 0818  GLUCAP 95 146* 151* 177* 69*   D-Dimer No results for input(s): DDIMER in the last 72 hours. Hgb A1c No results for input(s): HGBA1C in the last 72 hours. Lipid Profile No results for input(s): CHOL, HDL, LDLCALC, TRIG, CHOLHDL, LDLDIRECT in the last 72  hours. Thyroid function studies  No results for input(s): TSH, T4TOTAL, T3FREE, THYROIDAB in the last 72 hours.  Invalid input(s): FREET3 Anemia work up No results for input(s): VITAMINB12, FOLATE, FERRITIN, TIBC, IRON, RETICCTPCT in the last 72 hours. Urinalysis    Component Value Date/Time   COLORURINE YELLOW 09/02/2020 0140   APPEARANCEUR HAZY (A) 09/02/2020 0140   APPEARANCEUR Hazy 10/09/2014 0846   LABSPEC 1.019 09/02/2020 0140   LABSPEC 1.036 10/09/2014 0846   PHURINE 5.0 09/02/2020 0140   GLUCOSEU 50 (A) 09/02/2020 0140   GLUCOSEU >=500 10/09/2014 0846   HGBUR SMALL (A) 09/02/2020 0140   BILIRUBINUR NEGATIVE 09/02/2020 0140   BILIRUBINUR Negative 10/09/2014 0846   KETONESUR 5 (A) 09/02/2020 0140   PROTEINUR 100 (A) 09/02/2020 0140   NITRITE NEGATIVE 09/02/2020 0140   LEUKOCYTESUR NEGATIVE 09/02/2020 0140   LEUKOCYTESUR 2+ 10/09/2014 0846   Sepsis Labs Invalid input(s): PROCALCITONIN,  WBC,  LACTICIDVEN Microbiology Recent Results (from the past 240 hour(s))  Respiratory Panel by RT PCR (Flu A&B, Covid) - Nasopharyngeal Swab     Status: Abnormal   Collection Time: 09/02/20  3:31 AM   Specimen: Nasopharyngeal Swab  Result Value Ref Range Status   SARS Coronavirus 2 by RT PCR POSITIVE (A) NEGATIVE Final    Comment: RESULT CALLED TO, READ BACK BY AND VERIFIED WITH: Conception ChancyY PATTERSON RN 09/02/20 0421 JDW (NOTE) SARS-CoV-2 target nucleic acids are DETECTED.  SARS-CoV-2 RNA is generally detectable in upper respiratory specimens  during the acute phase of infection. Positive results are indicative of the presence of the identified virus, but do not rule out bacterial infection or co-infection with other pathogens not detected by the test. Clinical correlation with patient history and other diagnostic information is necessary to determine patient infection status. The expected result is Negative.  Fact Sheet for Patients:  https://www.moore.com/https://www.fda.gov/media/142436/download  Fact  Sheet for Healthcare Providers: https://www.young.biz/https://www.fda.gov/media/142435/download  This test is not yet approved or cleared by the [place] FDA and  has been authorized for detection and/or diagnosis of SARS-CoV-2 by FDA under an Emergency Use Authorization (EUA).  This EUA will remain in effect (meaning this test can be use d) for the duration of  the COVID-19 declaration under Section 564(b)(1) of the Act, 21 U.S.C. section 360bbb-3(b)(1), unless the authorization is terminated or revoked sooner.      Influenza A by PCR NEGATIVE NEGATIVE Final   Influenza B by PCR NEGATIVE NEGATIVE Final    Comment: (NOTE) The Xpert Xpress SARS-CoV-2/FLU/RSV assay is intended as an aid in  the diagnosis of influenza from Nasopharyngeal swab specimens and  should not be used as a sole basis for treatment. Nasal washings and  aspirates are unacceptable for Xpert Xpress SARS-CoV-2/FLU/RSV  testing.  Fact Sheet for Patients: https://www.moore.com/https://www.fda.gov/media/142436/download  Fact Sheet for Healthcare Providers: https://www.young.biz/https://www.fda.gov/media/142435/download  This test is not yet approved or cleared by the [place] FDA and  has been authorized for detection and/or diagnosis of SARS-CoV-2 by  FDA under an Emergency Use Authorization (EUA). This EUA will remain  in effect (meaning this test can be used) for the duration of the  Covid-19 declaration under Section 564(b)(1) of the Act, 21  U.S.C. section 360bbb-3(b)(1), unless the authorization is  terminated or revoked. Performed at [facility], [address] [place], [place] [address]   Culture, blood (routine x 2)     Status: None   Collection Time: 09/02/20  9:49 AM   Specimen: BLOOD RIGHT HAND  Result Value Ref Range Status   Specimen Description BLOOD  RIGHT HAND  Final   Special Requests   Final    BOTTLES DRAWN AEROBIC ONLY Blood Culture results may not be optimal due to an inadequate volume of blood received in culture bottles   Culture    Final    NO GROWTH 5 DAYS Performed at [facility], [address], [place], [place] [address]    Report Status 09/07/2020 FINAL  Final  Culture, blood (routine x 2)     Status: None   Collection Time: 09/02/20  9:50 AM   Specimen: BLOOD RIGHT ARM  Result Value Ref Range Status   Specimen Description BLOOD RIGHT ARM  Final   Special Requests   Final    BOTTLES DRAWN AEROBIC ONLY Blood Culture results may not be optimal due to an inadequate volume of blood received in culture bottles   Culture   Final    NO GROWTH 5 DAYS Performed at [facility], [address] [place], [place] [address]    Report Status 09/07/2020 FINAL  Final     Time coordinating discharge: Over 30 minutes  SIGNED:   Alvira Philips [place], DO  [facility] 09/10/2020, 10:17 AM

## 2020-09-15 LAB — C-REACTIVE PROTEIN

## 2020-09-25 ENCOUNTER — Ambulatory Visit: Payer: Medicaid Other | Admitting: Obstetrics and Gynecology

## 2021-01-25 ENCOUNTER — Ambulatory Visit: Payer: Self-pay | Admitting: *Deleted

## 2021-01-25 ENCOUNTER — Encounter: Payer: Self-pay | Admitting: Registered Nurse

## 2021-01-25 ENCOUNTER — Telehealth: Payer: Self-pay | Admitting: Registered Nurse

## 2021-01-25 ENCOUNTER — Other Ambulatory Visit: Payer: Self-pay

## 2021-01-25 VITALS — BP 144/87 | HR 99 | Temp 99.0°F

## 2021-01-25 DIAGNOSIS — L03119 Cellulitis of unspecified part of limb: Secondary | ICD-10-CM

## 2021-01-25 DIAGNOSIS — L02419 Cutaneous abscess of limb, unspecified: Secondary | ICD-10-CM

## 2021-01-25 MED ORDER — SULFAMETHOXAZOLE-TRIMETHOPRIM 800-160 MG PO TABS: 1.0000 | ORAL_TABLET | Freq: Two times a day (BID) | ORAL | 0 refills | Status: DC

## 2021-01-25 NOTE — Progress Notes (Unsigned)
40 year old Caucasian female presents wound to right posterior ankle area related to a report of "cart hitting it two weeks ago at work". Wound is 3 cm length x 0.5 cm width x 0.2 cm depth. 100% yellowish slough tissue noted in wound bed. One cm area of erythema circles the entire wound edges. No drainage noted. No tunneling or undermining noted. Increased warmth noted to surrounding skin. +pain localized to wound area. No bruising noted. Using clean technique, cleansed the wound with First Aid Antiseptic Spray (Benzalkonium chloride 0.13%) 4 x 4 guaze and applied thin ribbon of triple antibiotic ointment. Placed nonadherent dressing with adhesive to secure and protect wound. Guidance: Perform wound care twice daily and if bandage becomes wet or soiled, as demonstrated in clinic. Report signs of wound deterioration, such as, red streak, more pain, swelling, increasing redness around the wound, drainage from wound, fever. Return to clinic on Thursday, March 3rd between 11 am and 2 pm for further evaluation or sooner for questions or concerns. Dispensed preprinted bottle of Bactrim DS per Albina Billet PA: take one tablet every 12 hours; start with one table now. Acetaminophen 325 mgs 1-2 tablets now. Patient verbalized understanding of all instructions by repeating information back, as given.

## 2021-01-25 NOTE — Telephone Encounter (Signed)
Patient presented to RN Maxie Better at [facility] clinic today for infected scratch on leg.  Stated injured leg at work last week and worsening.  RN Gayleen Orem contacted me via telephone to discuss patient evaluation/plan of care.  Having some purulent drainage.  Afebrile today with history of diabetes, hypertension.  Patient reported last Hgba1c Jan 2022 11.  That she is taking her lisinopril, atorvastatin, metformin, insulin, gabapentin and fenofibrate every day.  Prescribed bactrim DS po BID x 7 days for patient and given 1 bottle from [facility] today.  Patient to follow up in 48 hours for re-evaluation.  RN Gayleen Orem to perform wound care today with first aid spray, apply triple antibiotic and telfa gauze may secure with cobain, surginette, paper tape (patient preference all available in clinic). Wash affected area with soap and water daily and change dressing prn soiling at least daily. May take tylenol 1000mg  po q6h prn pain.  Elevate foot/leg when sitting.  May apply ice 15 minutes prn swelling.  Patient to be re-evaluated by another provider prior to 01/27/21 appt with me if red streaks extending to knee, fever/chills or condition worsening despite plan of care.  RN Cates verbalized understanding information/instructions and had no further questions at this time.

## 2021-01-28 ENCOUNTER — Encounter: Payer: Self-pay | Admitting: Registered Nurse

## 2021-01-28 ENCOUNTER — Ambulatory Visit: Payer: Self-pay | Admitting: Registered Nurse

## 2021-01-28 ENCOUNTER — Ambulatory Visit: Payer: Self-pay | Admitting: *Deleted

## 2021-01-28 ENCOUNTER — Other Ambulatory Visit: Payer: Self-pay

## 2021-01-28 DIAGNOSIS — L02419 Cutaneous abscess of limb, unspecified: Secondary | ICD-10-CM

## 2021-01-28 DIAGNOSIS — E1121 Type 2 diabetes mellitus with diabetic nephropathy: Secondary | ICD-10-CM

## 2021-01-28 DIAGNOSIS — S90511A Abrasion, right ankle, initial encounter: Secondary | ICD-10-CM

## 2021-01-28 DIAGNOSIS — L03119 Cellulitis of unspecified part of limb: Secondary | ICD-10-CM

## 2021-01-28 DIAGNOSIS — Z5189 Encounter for other specified aftercare: Secondary | ICD-10-CM

## 2021-01-28 MED ORDER — SULFAMETHOXAZOLE-TRIMETHOPRIM 800-160 MG PO TABS: 1.0000 | ORAL_TABLET | Freq: Two times a day (BID) | ORAL | 0 refills | Status: DC

## 2021-01-28 MED ORDER — SULFAMETHOXAZOLE-TRIMETHOPRIM 400-80 MG PO TABS: 1.0000 | ORAL_TABLET | Freq: Two times a day (BID) | ORAL | Status: DC

## 2021-01-28 NOTE — Progress Notes (Signed)
40 year old cauc female presents to the clinic for wound reassessment and dressing. Wound with signs of healing, including decreased redness, no drainage, no increased warmth, no pain.+"itching" [name] discussed treatment plan with patient.  Cleansed area with First Aid Antiseptic Spray. Patted dry. Applied triple antibiotic and covered with adhesive edges of nonadherent bandage. Continue wound care as prescribed by provider's verbal instructions. Given Aquaphor to use on wound for itching per PA instructions.

## 2021-01-28 NOTE — Patient Instructions (Addendum)
Abrasion An abrasion is a cut or a scrape on the surface of the skin. An abrasion does not go through all the layers of the skin. It is important to care for an abrasion properly to prevent infection. What are the causes? This condition is caused by rubbing your skin on something or falling on a surface, such as the ground. When your skin rubs on something, some layers of skin may rub off. What are the signs or symptoms? The main symptom of this condition is a cut or a scrape. The cut or scrape may be bleeding, or it may appear red or pink. If your abrasion was caused by a fall, there may be a bruise under your cut or scrape. How is this diagnosed? An abrasion is diagnosed with a physical exam. How is this treated? Treatment for this condition depends on how large and deep the abrasion is. In most cases:  Your abrasion will be cleaned with water and mild soap. This is done to remove any dirt or debris (such as tiny bits of glass or rock) that may be stuck in your wound.  An antibiotic ointment may be applied to your abrasion to help prevent infection.  A bandage (dressing) may be placed on your abrasion to keep it clean. You may also need a tetanus shot. Follow these instructions at home: Medicines  Take or apply over-the-counter and prescription medicines only as told by your health care provider.  If you were prescribed an antibiotic medicine, use it as told by your health care provider. Do not stop using the antibiotic even if you start to feel better. Wound care  Clean your wound 1 or 2 times a day, or as told by your health care provider. To do this: 1. Wash your hands for at least 20 seconds with mild soap and water. Do this before and after you clean your wound. 2. Wash your wound with mild soap and water and then rinse off the soap. 3. Pat your wound dry with a clean towel. Do not rub your wound.  Keep your dressing clean and dry as told by your health care provider.  There  are many different ways to close and cover a wound. Follow instructions from your health care provider about caring for your wound and about changing and removing your dressing. You may have to change your dressing one or more times a day, or as directed by your health care provider.  Check your wound every day for signs of infection. Check for: ? Redness, especially a red streak that spreads out from your wound. ? Swelling or increased pain. ? Warmth. ? Blood, fluid, pus, or a bad smell. Managing pain and swelling  If directed, put ice on the injured area. To do this: ? Put ice in a plastic bag. ? Place a towel between your skin and the bag. ? Leave the ice on for 20 minutes, 2-3 times a day. ? Remove the ice if your skin turns bright red. This is very important. If you cannot feel pain, heat, or cold, you have a greater risk of damage to the area.  If possible, raise (elevate) the injured area above the level of your heart while you are sitting or lying down.   General instructions  Do not take baths, swim, or use a hot tub until your health care provider approves. Ask your doctor about taking showers or sponge baths.  Keep all follow-up visits. This is important. Contact a health care  provider if:  You received a tetanus shot, and you have swelling, severe pain, redness, or bleeding at your injection site.  Your pain is not controlled with medicine.  You have a fever.  You have any of these signs of infection: ? Redness, swelling, or more pain around your wound. ? Warmth coming from your wound. ? Blood, fluid, pus, or a bad smell coming from your wound. Get help right away if:  You have a red streak spreading away from your wound. Summary  An abrasion is a cut or a scrape on the surface of the skin. Care for your abrasion properly to prevent infection.  Clean your wound with mild soap and water 1 or 2 times a day or as often as told. Follow instructions from your health  care provider about taking medicines and changing your bandage (dressing).  Contact your health care provider if you have a fever or if you have redness, swelling, or more pain around your wound.  Contact your health care provider if you have warmth, blood, fluid, pus, or a bad smell coming from your wound.  Get help right away if there is a red streak spreading away from your wound. This information is not intended to replace advice given to you by your health care provider. Make sure you discuss any questions you have with your health care provider. Document Revised: 02/13/2020 Document Reviewed: 02/13/2020 Elsevier Patient Education  2021 [employer] Diabetes Mellitus and Foot Care Foot care is an important part of your health, especially when you have diabetes. Diabetes may cause you to have problems because of poor blood flow (circulation) to your feet and legs, which can cause your skin to:  Become thinner and drier.  Break more easily.  Heal more slowly.  Peel and crack. You may also have nerve damage (neuropathy) in your legs and feet, causing decreased feeling in them. This means that you may not notice minor injuries to your feet that could lead to more serious problems. Noticing and addressing any potential problems early is the best way to prevent future foot problems. How to care for your feet Foot hygiene  Wash your feet daily with warm water and mild soap. Do not use hot water. Then, pat your feet and the areas between your toes until they are completely dry. Do not soak your feet as this can dry your skin.  Trim your toenails straight across. Do not dig under them or around the cuticle. File the edges of your nails with an emery board or nail file.  Apply a moisturizing lotion or petroleum jelly to the skin on your feet and to dry, brittle toenails. Use lotion that does not contain alcohol and is unscented. Do not apply lotion between your toes.   Shoes and  socks  Wear clean socks or stockings every day. Make sure they are not too tight. Do not wear knee-high stockings since they may decrease blood flow to your legs.  Wear shoes that fit properly and have enough cushioning. Always look in your shoes before you put them on to be sure there are no objects inside.  To break in new shoes, wear them for just a few hours a day. This prevents injuries on your feet. Wounds, scrapes, corns, and calluses  Check your feet daily for blisters, cuts, bruises, sores, and redness. If you cannot see the bottom of your feet, use a mirror or ask someone for help.  Do not cut corns or calluses or  try to remove them with medicine.  If you find a minor scrape, cut, or break in the skin on your feet, keep it and the skin around it clean and dry. You may clean these areas with mild soap and water. Do not clean the area with peroxide, alcohol, or iodine.  If you have a wound, scrape, corn, or callus on your foot, look at it several times a day to make sure it is healing and not infected. Check for: ? Redness, swelling, or pain. ? Fluid or blood. ? Warmth. ? Pus or a bad smell.   General tips  Do not cross your legs. This may decrease blood flow to your feet.  Do not use heating pads or hot water bottles on your feet. They may burn your skin. If you have lost feeling in your feet or legs, you may not know this is happening until it is too late.  Protect your feet from hot and cold by wearing shoes, such as at the beach or on hot pavement.  Schedule a complete foot exam at least once a year (annually) or more often if you have foot problems. Report any cuts, sores, or bruises to your health care provider immediately. Where to find more information  American Diabetes Association: www.diabetes.org  Association of Diabetes Care & Education Specialists: www.diabeteseducator.org Contact a health care provider if:  You have a medical condition that increases your  risk of infection and you have any cuts, sores, or bruises on your feet.  You have an injury that is not healing.  You have redness on your legs or feet.  You feel burning or tingling in your legs or feet.  You have pain or cramps in your legs and feet.  Your legs or feet are numb.  Your feet always feel cold.  You have pain around any toenails. Get help right away if:  You have a wound, scrape, corn, or callus on your foot and: ? You have pain, swelling, or redness that gets worse. ? You have fluid or blood coming from the wound, scrape, corn, or callus. ? Your wound, scrape, corn, or callus feels warm to the touch. ? You have pus or a bad smell coming from the wound, scrape, corn, or callus. ? You have a fever. ? You have a red line going up your leg. Summary  Check your feet every day for blisters, cuts, bruises, sores, and redness.  Apply a moisturizing lotion or petroleum jelly to the skin on your feet and to dry, brittle toenails.  Wear shoes that fit properly and have enough cushioning.  If you have foot problems, report any cuts, sores, or bruises to your health care provider immediately.  Schedule a complete foot exam at least once a year (annually) or more often if you have foot problems. This information is not intended to replace advice given to you by your health care provider. Make sure you discuss any questions you have with your health care provider. Document Revised: 06/04/2020 Document Reviewed: 06/04/2020 Elsevier Patient Education  2021 [employer] Living With Diabetes Diabetes (type 1 diabetes mellitus or type 2 diabetes mellitus) is a condition in which the body does not have enough of a hormone called insulin, or the body does not respond properly to insulin. Normally, insulin allows sugars (glucose) to enter cells in the body. With diabetes, extra glucose builds up in the blood instead of going into cells. This results in high blood glucose  (hyperglycemia). How to  manage lifestyle changes Managing diabetes includes medical treatments as well as lifestyle changes. If diabetes is not managed well, serious physical and emotional complications can occur. Taking good care of yourself means that you are responsible for:  Monitoring glucose regularly.  Eating a healthy diet.  Exercising regularly.  Meeting with health care providers.  Taking medicines as directed. Most people feel some stress about managing their diabetes. When this stress becomes too much, it is known as diabetes-related distress. This is very common. Living with diabetes can place you at risk for diabetes distress, depression, or anxiety. These disorders can make diabetes more difficult to manage. How to recognize stress You may have diabetes distress if you:  Avoid or ignore your daily diabetes care. This includes glucose testing, following a meal plan, and taking medications.  Feel overwhelmed by your daily diabetes care.  Experience emotional reactions such as anger, sadness, or fear related to your daily diabetes care.  Feel fear or shame about not doing everything perfectly that you have been told to do. Emotional distress Symptoms of diabetes distress include:  Anger about having a diagnosis of diabetes.  Fear or frustration about your diagnosis and the changes you need to make to manage the condition.  Being overly worried about the care that you need or the cost of the care that you need.  Feeling like you caused your condition by doing something wrong.  Fear about unpredictable fluctuations in your blood glucose, like low or high blood glucose.  Feeling judged by your health care providers.  Feeling very alone with the disease. Depression Having diabetes means that you are at a higher risk for depression. Your health care provider may test (screen) you for symptoms of depression. It is important to recognize symptoms and to start treatment  for depression soon after it is diagnosed. The following are some symptoms of depression:  Loss of interest in things that you used to enjoy.  Feeling depressed much or most of the time.  A change in appetite.  Trouble getting to sleep or staying asleep.  Feeling tired most of the day.  Feeling nervous and anxious.  Feeling guilty and worrying that you are a burden to others.  Having thoughts of hurting yourself or feeling that you want to die. If you have any of these symptoms, more days than not, for 2 weeks or longer, you may have depression. This would be a good time to contact your health care provider. Follow these instructions at home: Managing diabetes distress The following are some ways to manage emotional distress:  Learn as much as you can about diabetes and its treatment. Take one step at a time to improve your management.  Meet with a certified diabetes care and education specialist. Take a class to learn how to manage your condition.  Consider working with a counselor or therapist.  Keep a journal of your thoughts and concerns.  Accept that some things are out of your control.  Talk with other people who have diabetes. It can help to talk about the distress that you feel.  Find ways to manage stress that work for you. These may include art or music therapy, exercise, meditation, and hobbies.  Seek support from spiritual leaders, family, and friends.   General instructions  Do your best to follow your diabetes management plan.  If you are struggling to follow your plan, talk with a certified diabetes care and education specialist, or with someone else who has diabetes. They may  have ideas that will help.  Forgive yourself for not being perfect. Almost everyone struggles with the tasks of diabetes.  Keep all follow-up visits. This is important. Where to find support  Search for information and support from the American Diabetes Association:  www.diabetes.org  Find a certified diabetes education and care specialist. Make an appointment through the Association of Diabetes Care & Education Specialists: www.diabeteseducator.org Contact a health care provider if:  You believe your diabetes is getting out of control.  You are concerned you may be depressed.  You think your medications are not helping control your diabetes.  You are feeling overwhelmed with your diabetes. Get help right away if:  You have thoughts about hurting yourself or others. If you ever feel like you may hurt yourself or others, or have thoughts about taking your own life, get help right away. You can go to your nearest emergency department or call:  Your local emergency services (911 in the [place]  A suicide crisis helpline, such as the National Suicide Prevention Lifeline at 937-435-4472. This is open 24 hours a day. Summary  Diabetes (type 1 diabetes mellitus or type 2 diabetes mellitus) is a condition in which the body does not have enough of a hormone called insulin, or the body does not respond properly to insulin.  Living with diabetes puts you at risk for medical and emotional issues, such as diabetes distress, depression, and anxiety.  Recognizing the symptoms of diabetes distress and depression may help you avoid problems with your diabetes control. If you experience symptoms, it is important to discuss this with your health care provider, certified diabetes care and education specialist, or therapist.  It is important to start treatment for diabetes distress and depression soon after diagnosis.  Ask your health care provider to recommend a therapist who understands both depression and diabetes. This information is not intended to replace advice given to you by your health care provider. Make sure you discuss any questions you have with your health care provider. Document Revised: 03/26/2020 Document Reviewed: 03/26/2020 Elsevier Patient  Education  2021 [employer] Cellulitis, Adult  Cellulitis is a skin infection. The infected area is usually warm, red, swollen, and tender. This condition occurs most often in the arms and lower legs. The infection can travel to the muscles, blood, and underlying tissue and become serious. It is very important to get treated for this condition. What are the causes? Cellulitis is caused by bacteria. The bacteria enter through a break in the skin, such as a cut, burn, insect bite, open sore, or crack. What increases the risk? This condition is more likely to occur in people who:  Have a weak body defense system (immune system).  Have open wounds on the skin, such as cuts, burns, bites, and scrapes. Bacteria can enter the body through these open wounds.  Are older than 41 years of age.  Have diabetes.  Have a type of long-lasting (chronic) liver disease (cirrhosis) or kidney disease.  Are obese.  Have a skin condition such as: ? Itchy rash (eczema). ? Slow movement of blood in the veins (venous stasis). ? Fluid buildup below the skin (edema).  Have had radiation therapy.  Use IV drugs. What are the signs or symptoms? Symptoms of this condition include:  Redness, streaking, or spotting on the skin.  Swollen area of the skin.  Tenderness or pain when an area of the skin is touched.  Warm skin.  A fever.  Chills.  Blisters. How is  this diagnosed? This condition is diagnosed based on a medical history and physical exam. You may also have tests, including:  Blood tests.  Imaging tests. How is this treated? Treatment for this condition may include:  Medicines, such as antibiotic medicines or medicines to treat allergies (antihistamines).  Supportive care, such as rest and application of cold or warm cloths (compresses) to the skin.  Hospital care, if the condition is severe. The infection usually starts to get better within 1-2 days of treatment. Follow these  instructions at home: Medicines  Take over-the-counter and prescription medicines only as told by your health care provider.  If you were prescribed an antibiotic medicine, take it as told by your health care provider. Do not stop taking the antibiotic even if you start to feel better. General instructions  Drink enough fluid to keep your urine pale yellow.  Do not touch or rub the infected area.  Raise (elevate) the infected area above the level of your heart while you are sitting or lying down.  Apply warm or cold compresses to the affected area as told by your health care provider.  Keep all follow-up visits as told by your health care provider. This is important. These visits let your health care provider make sure a more serious infection is not developing.   Contact a health care provider if:  You have a fever.  Your symptoms do not begin to improve within 1-2 days of starting treatment.  Your bone or joint underneath the infected area becomes painful after the skin has healed.  Your infection returns in the same area or another area.  You notice a swollen bump in the infected area.  You develop new symptoms.  You have a general ill feeling (malaise) with muscle aches and pains. Get help right away if:  Your symptoms get worse.  You feel very sleepy.  You develop vomiting or diarrhea that persists.  You notice red streaks coming from the infected area.  Your red area gets larger or turns dark in color. These symptoms may represent a serious problem that is an emergency. Do not wait to see if the symptoms will go away. Get medical help right away. Call your local emergency services (911 in the [place] Do not drive yourself to the hospital. Summary  Cellulitis is a skin infection. This condition occurs most often in the arms and lower legs.  Treatment for this condition may include medicines, such as antibiotic medicines or antihistamines.  Take over-the-counter  and prescription medicines only as told by your health care provider. If you were prescribed an antibiotic medicine, do not stop taking the antibiotic even if you start to feel better.  Contact a health care provider if your symptoms do not begin to improve within 1-2 days of starting treatment or your symptoms get worse.  Keep all follow-up visits as told by your health care provider. This is important. These visits let your health care provider make sure that a more serious infection is not developing. This information is not intended to replace advice given to you by your health care provider. Make sure you discuss any questions you have with your health care provider. Document Revised: 11/25/2019 Document Reviewed: 04/05/2018 Else[employer] Patient Education  2021 [employer]

## 2021-01-28 NOTE — Progress Notes (Signed)
Subjective:    Patient ID: [name], female    DOB: 1981/02/28, 40 y.o.   MRN: [id number]  40y/o caucasian female new patient here to follow up leg infection was started on bactrim DS po BID 01/25/21 after phone consultation with RN Gayleen Orem as no NP clinic until today 01/28/21.  Patient had abrasion from work cart that became infection on right ankle/posterior leg.  Patient reported swelling and pain and discharge has decreased and affected area not itchy but feeling much better.  Denied fever/chills.  Stated taking medication as prescribed without stomach upset.  She is taking her blood pressure and diabetic medications also.  Eating lunch and dinner typically skips breakfast but drinks a gallon of water per day.  Last Hgba1c 11 per patient January and her medications were adjusted but she reported horrible eating habits despite nutrition classes.  "I know what I need to do but I like pasta and I don't measure out portion sizes"  Patient reported she has nerve problems due to diabetes and whenever she gets cut on feet "they take months to heal".     Review of Systems  Constitutional: Negative for activity change, appetite change, chills, diaphoresis, fatigue and fever.  HENT: Negative for trouble swallowing and voice change.   Eyes: Negative for photophobia and visual disturbance.  Respiratory: Negative for cough, shortness of breath, wheezing and stridor.   Gastrointestinal: Negative for abdominal pain, diarrhea, nausea and vomiting.  Endocrine: Negative for cold intolerance and heat intolerance.  Genitourinary: Negative for difficulty urinating.  Musculoskeletal: Negative for gait problem, myalgias, neck pain and neck stiffness.  Skin: Positive for rash and wound. Negative for color change and pallor.  Allergic/Immunologic: Negative for food allergies.  Neurological: Negative for dizziness, tremors, seizures, syncope, speech difficulty, weakness and headaches.  Hematological: Negative for  adenopathy. Does not bruise/bleed easily.  Psychiatric/Behavioral: Negative for agitation, confusion and sleep disturbance.       Objective:   Physical Exam Nursing note reviewed.  Constitutional:      General: She is awake. She is not in acute distress.    Appearance: Normal appearance. She is well-developed and well-groomed. She is obese. She is not ill-appearing, toxic-appearing or diaphoretic.  HENT:     Head: Normocephalic and atraumatic.     Jaw: There is normal jaw occlusion.     Right Ear: External ear normal.     Left Ear: External ear normal.     Nose: Nose normal. No congestion or rhinorrhea.     Mouth/Throat:     Pharynx: Oropharynx is clear.  Eyes:     General: Lids are normal. Vision grossly intact. Gaze aligned appropriately. No allergic shiner, visual field deficit or scleral icterus.       Right eye: No discharge.        Left eye: No discharge.     Extraocular Movements: Extraocular movements intact.     Conjunctiva/sclera: Conjunctivae normal.     Pupils: Pupils are equal, round, and reactive to light.  Neck:     Trachea: Trachea and phonation normal. No tracheal deviation.  Cardiovascular:     Rate and Rhythm: Normal rate and regular rhythm.     Pulses: Normal pulses.          Dorsalis pedis pulses are 2+ on the right side.  Pulmonary:     Effort: Pulmonary effort is normal. No respiratory distress.     Breath sounds: Normal breath sounds and air entry. No stridor or transmitted upper  airway sounds. No wheezing.     Comments: Spoke full sentences without difficulty; wearing mask due to covid 19 pandemic; no cough in exam room Abdominal:     Palpations: Abdomen is soft.  Musculoskeletal:        General: Signs of injury present. No swelling, tenderness or deformity. Normal range of motion.     Cervical back: Normal range of motion and neck supple. No edema, erythema, signs of trauma, rigidity, torticollis or crepitus. No pain with movement. Normal range of  motion.     Right knee: No swelling, deformity, effusion, erythema, ecchymosis, lacerations or crepitus. Normal range of motion. No tenderness.     Right lower leg: No swelling, deformity, lacerations, tenderness or bony tenderness. No edema.     Left lower leg: No edema.     Right foot: No swelling, laceration, tenderness or crepitus.     Left foot: No swelling, tenderness or crepitus.  Lymphadenopathy:     Head:     Right side of head: No preauricular adenopathy.     Left side of head: No preauricular adenopathy.     Cervical: No cervical adenopathy.     Right cervical: No superficial cervical adenopathy.    Left cervical: No superficial cervical adenopathy.  Skin:    General: Skin is warm.     Capillary Refill: Capillary refill takes less than 2 seconds.     Coloration: Skin is not ashen, cyanotic, jaundiced, mottled, pale or sallow.     Findings: Abrasion, erythema, signs of injury, rash and wound present. No acne, bruising, burn, ecchymosis, laceration, lesion or petechiae. Rash is macular, papular and pustular. Rash is not crusting, nodular, purpuric, scaling, urticarial or vesicular.     Nails: There is no clubbing.       Neurological:     General: No focal deficit present.     Mental Status: She is alert and oriented to person, place, and time. Mental status is at baseline.     GCS: GCS eye subscore is 4. GCS verbal subscore is 5. GCS motor subscore is 6.     Cranial Nerves: No cranial nerve deficit, dysarthria or facial asymmetry.     Motor: Motor function is intact. No weakness, tremor, atrophy, abnormal muscle tone or seizure activity.     Coordination: Coordination is intact. Coordination normal.     Gait: Gait is intact. Gait normal.     Comments: In/out of chair without difficulty; bilateral hand grasp equal 5/5; gait sure and steady in clinic  Psychiatric:        Attention and Perception: Attention and perception normal.        Mood and Affect: Mood and affect normal.         Speech: Speech normal.        Behavior: Behavior normal. Behavior is cooperative.        Thought Content: Thought content normal.        Cognition and Memory: Cognition and memory normal.        Judgment: Judgment normal.           Assessment & Plan:  A-cellulitis right ankle initial visit; abrasion initial visit; type 2 diabetes mellitis with diabetic neuropathy uncontrolled  P-wound care ordered and RN Maxie Better completed with first aid spray, triple antitiotic ointment applied then telfa gauze.  Patient to wash with soap and water daily and change dressing daily after washing and prn soiling.  Keep covered with triple antibiotic ointment or aquaphor until  completely healed.  Continue bactrim DS po BID until 7 days completed.  Follow up appt scheduled for next Tuesday 0900 with me.  Patient to follow up sooner if new or worsening swelling/discharge, fever, chills.  Patient given exitcare handouts for cellulitis and abrasion printed. Do not soak right lower leg in dirty water until abrasions healed avoid pool, lake, hot tub, dirty sink water. May shower daily apply neosporin or vaseline/aquaphor at least daily;  keep wounds covered they will heal faster and prevent contamination rubbing from clothing tearing off scabs.  Given triple antibiotic, UD aquaphor and bandaids from clinic stop change at least daily after washing with soap and water.  Change prn soiling. Exitcare handout on abrasion given to patient.  Patient verbalized understanding information/instructions, agreed with plan of care and had no further questions at this time.  Discussed healthy food choices e.g. fresh/frozen vegetables, non-starchy carbs, lean proteins.  Consider oatmeal with fruit or low sugar yogurt/high protein for breakfast.  Caution with protein/oatmeal bars read label to check for added sugars.  Offered diabetic food handouts and patient refused at this time.  Discussed serving of pasta typical 1/2 cup.   Discussed whole wheat pasta typically less carbs consider switching.  Ensure taking her medications every day as prescribed.  Avoiding dehydration.  Follow up with RN Rolly Salter or I when ready to discuss further nutrition/dietary choices to decrease hgba1c/improve blood sugar control.  Discussed nerve damage organs/eyes can occur if Hgba1c continues at 10 and poor quality of life as she ages where dietary changes now could lead to better health outcomes/quality of life.  Patient verbalized understanding information/not ready to change dietary habits at this time.  She will follow up with PCM as scheduled.  Patient had no further questions or concerns at this time.

## 2021-02-02 ENCOUNTER — Telehealth: Payer: Self-pay | Admitting: *Deleted

## 2021-02-02 DIAGNOSIS — L03119 Cellulitis of unspecified part of limb: Secondary | ICD-10-CM

## 2021-02-02 DIAGNOSIS — L02419 Cutaneous abscess of limb, unspecified: Secondary | ICD-10-CM

## 2021-02-02 MED ORDER — SULFAMETHOXAZOLE-TRIMETHOPRIM 800-160 MG PO TABS
1.0000 | ORAL_TABLET | Freq: Two times a day (BID) | ORAL | 0 refills | Status: AC
Start: 2021-02-02 — End: 2021-02-09

## 2021-02-02 NOTE — Telephone Encounter (Signed)
Patient seen in clinic today wound still healing and some purulent material in wound bed.  Erythema around wound edges swelling minimal afebrile A&Ox3 skin warm and pink.  Patient wearing bandage over affected area and scant discharge on telfa when removed. Gait sure and steady in clinic. Continue bactrim ds po BID for another 7 days has sufficient supply tabs at home.  Follow up in 1 week 02/09/2021 at 0900.  Continue washing daily with soapy water and allow to air dry before reapplying bandage prior to bed.  Remove dressing to shower.  Patient to continue neosporin or vaseline/aquaphor application daily until healed.  Patient verbalized understanding information/instructions, agreed with plan of care and had no further questions at this time.

## 2021-02-02 NOTE — Telephone Encounter (Signed)
Pt presented to clinic for f/u of R lateral lower leg wound check. Was originally seen by Maxie Better RN in clinic on 2/28. Bactrim was started then. Seen by NP Inetta Fermo 3/3 in clinic. Today she returns with bandage in place. Removes with small amount of drainage present on bandage. Has been applying neosporin to area daily with bandage change. Pt to RTC in one week for f/u. Instructed by NP Inetta Fermo to continue Bactrim BID until f/u 3/15.

## 2021-02-09 ENCOUNTER — Encounter: Payer: Self-pay | Admitting: Registered Nurse

## 2021-02-09 ENCOUNTER — Telehealth: Payer: Self-pay | Admitting: Registered Nurse

## 2021-02-09 DIAGNOSIS — S90511D Abrasion, right ankle, subsequent encounter: Secondary | ICD-10-CM

## 2021-02-09 MED ORDER — TRIPLE ANTIBIOTIC 5-400-5000 EX OINT: TOPICAL_OINTMENT | Freq: Every day | CUTANEOUS | 0 refills | Status: AC

## 2021-02-09 NOTE — Telephone Encounter (Signed)
Patient in clinic for wound check.  Scant yellow discharge on telfa pad still has some scab noted linear 3cm right ankle no edema surrounding skin still hyperemic.  Discussed with patient continue neosporin topical generic daily to BID and wash daily with soap and water and monitor for worsening swelling/pain/discharge.  May stop bactrim DS.  Patient instructed to continue vaseline/aquaphor/neosporin until fully healed.  Cover while at work.  Encouraged to eat low added sugar diabetic diet.  Avoid dehydration, drink water to keep urine pale yellow clear and voiding every 2-4 hours while awake.  Patient taking her prescribed medications as prescribed per patient from [facility]. Follow up prn if new or worsening symptoms.  Patient gait sure and steady in clinic.  In/out of chair without difficulty.  No edema lower extremity no sock line at ankle.  Patient verbalized understanding information/instructions, agreed with plan of care and had no further questions at this time.

## 2022-04-27 ENCOUNTER — Ambulatory Visit: Payer: No Typology Code available for payment source | Admitting: Primary Care

## 2022-04-27 ENCOUNTER — Encounter: Payer: Self-pay | Admitting: Primary Care

## 2022-04-27 ENCOUNTER — Other Ambulatory Visit: Payer: No Typology Code available for payment source

## 2022-04-27 VITALS — BP 148/82 | HR 100 | Temp 98.3°F | Ht 64.0 in | Wt 204.0 lb

## 2022-04-27 DIAGNOSIS — E781 Pure hyperglyceridemia: Secondary | ICD-10-CM

## 2022-04-27 DIAGNOSIS — L03116 Cellulitis of left lower limb: Secondary | ICD-10-CM | POA: Insufficient documentation

## 2022-04-27 DIAGNOSIS — I10 Essential (primary) hypertension: Secondary | ICD-10-CM | POA: Diagnosis not present

## 2022-04-27 DIAGNOSIS — E1165 Type 2 diabetes mellitus with hyperglycemia: Secondary | ICD-10-CM | POA: Diagnosis not present

## 2022-04-27 LAB — POCT GLYCOSYLATED HEMOGLOBIN (HGB A1C): Hemoglobin A1C: 10.8 % — AB (ref 4.0–5.6)

## 2022-04-27 LAB — MICROALBUMIN / CREATININE URINE RATIO
Creatinine,U: 62.8 mg/dL
Microalb Creat Ratio: 151.7 mg/g — ABNORMAL HIGH (ref 0.0–30.0)
Microalb, Ur: 95.2 mg/dL — ABNORMAL HIGH (ref 0.0–1.9)

## 2022-04-27 MED ORDER — FREESTYLE LIBRE 3 SENSOR MISC: 1 refills | Status: DC

## 2022-04-27 MED ORDER — SULFAMETHOXAZOLE-TRIMETHOPRIM 800-160 MG PO TABS: 1.0000 | ORAL_TABLET | Freq: Two times a day (BID) | ORAL | 0 refills | Status: DC

## 2022-04-27 MED ORDER — METFORMIN HCL ER 500 MG PO TB24: ORAL_TABLET | ORAL | 0 refills | Status: DC

## 2022-04-27 NOTE — Progress Notes (Signed)
Subjective:    Patient ID: [name], female    DOB: Dec 17, 1980, 41 y.o.   MRN: [id number]  HPI  [name] is a very pleasant 41 y.o. female with a history of uncontrolled type 2 diabetes, neuropathy, pancreatitis, hyperlipidemia, anxiety and depression who presents today for follow up of diabetes and to discuss skin wound.  1) Type 2 Diabetes:  She has not been seen by me since September 2020.  Current medications include:   Previously managed on Lantus 50 units twice daily, Novolin R 28 units 3 times daily with meals, metformin 1000 mg twice daily.  She has been consistently using Novolin R and Lantus (back supply from prior years) over the last year. She is injecting Lantus 50 units twice daily and injecting 30 units of Novolin R TID with meals.   She is checking her blood glucose 0 times daily as she doesn't have a glucometer. She is interested in the [employer] sensor.     Last A1C: 10.9 in October 2021, 10.8 today  Last Eye Exam: Due Last Foot Exam: Due Pneumonia Vaccination: 2020 Urine Microalbumin: Due Statin: None.  Dietary changes since last visit: She is eating a lot of junk food. She uses an air fryer mostly, eats packaged and frozen foods. Increased cravings with binge eating of sweets. Mostly sugar free drinks and water. She has never seen a diabetic nutritionist.   Exercise: None, but active active at work.   2) Skin Wound: Acute for the last 2 weeks and located to the left posterior calf with itching and redness. She then developed a wound to the site which burst open, green/whitish drainage. Over the last few days she's noticed increased redness and pain with drainage. She denies known injury/trauma, insect bite   3) Hypertriglyceridemia: Chronic.  Previously managed on atorvastatin 20 mg daily, she has been without this medication for nearly 3 years as she has not been seen since then.  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  History of acute pancreatitis.  BP Readings from Last 3 Encounters:  04/27/22 (!) 148/82  01/25/21 (!) 144/87  09/10/20 102/70     Review of Systems  Constitutional:  Positive for fatigue. Negative for fever.  Respiratory:  Negative for shortness of breath.   Cardiovascular:  Negative for chest pain.  Endocrine: Positive for polydipsia and polyuria.  Skin:  Positive for color change and wound.  Neurological:  Positive for numbness.        Past Medical History:  Diagnosis Date   Diabetes mellitus    Hypercholesteremia    Hypertension    Pancreatitis     Social History   Socioeconomic History   Marital status: Single    Spouse name: Not on file   Number of children: Not on file   Years of education: Not on file   Highest education level: Not on file  Occupational History   Not on file  Tobacco Use   Smoking status: Former   Smokeless tobacco: Never  Substance and Sexual Activity   Alcohol use: No   Drug use: No   Sexual activity: Never  Other Topics Concern   Not on file  Social History Narrative   Not on file   Social Determinants of Health   Financial Resource Strain: Not on file  Food Insecurity: Not on file  Transportation Needs: Not on file  Physical Activity: Not on file  Stress: Not on file  Social Connections: Not on file  Intimate Partner Violence: Not on file    Past Surgical History:  Procedure Laterality Date   KNEE SURGERY      Family History  Problem Relation Age of Onset   Cancer Other    Diabetes Other    Hypertension Other    Cancer Paternal Grandfather    Depression Mother    Diabetes Father    Hypertension Father    Depression Brother     No Known Allergies  Current Outpatient Medications on File Prior to Visit  Medication Sig Dispense Refill   insulin glargine (LANTUS) 100 UNIT/ML Solostar Pen Inject 50 Units into the skin 2 (two) times daily. 15 mL 5   insulin regular (NOVOLIN R) 100  units/mL injection Inject 0.28 mLs (28 Units total) into the skin 3 (three) times daily before meals. 10 mL 5   atorvastatin (LIPITOR) 20 MG tablet Take 1 tablet (20 mg total) by mouth daily. For cholesterol. (Patient not taking: Reported on 04/27/2022) 90 tablet 1   gabapentin (NEURONTIN) 300 MG capsule Take 1 capsule (300 mg total) by mouth at bedtime. For neuropathy. (Patient not taking: Reported on 04/27/2022) 90 capsule 0   lisinopril (ZESTRIL) 5 MG tablet Take 1 tablet (5 mg total) by mouth daily. For blood pressure and kidney protection. (Patient not taking: Reported on 04/27/2022) 90 tablet 1   metFORMIN (GLUCOPHAGE) 1000 MG tablet Take 1 tablet (1,000 mg total) by mouth 2 (two) times daily with a meal. (Patient not taking: Reported on 04/27/2022) 180 tablet 1   No current facility-administered medications on file prior to visit.    BP (!) 148/82   Pulse 100   Temp 98.3 F (36.8 C) (Oral)   Ht 5\' 4"  (1.626 m)   Wt 204 lb (92.5 kg)   SpO2 97%   BMI 35.02 kg/m  Objective:   Physical Exam Cardiovascular:     Rate and Rhythm: Normal rate and regular rhythm.  Pulmonary:     Effort: Pulmonary effort is normal.     Breath sounds: Normal breath sounds.  Musculoskeletal:     Cervical back: Neck supple.  Skin:    General: Skin is warm and dry.     Findings: Erythema present.     Comments: 0.5 cm rounded open would to left posterior calf with green drainage.  Surrounding erythema.   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

## 2022-04-27 NOTE — Assessment & Plan Note (Signed)
Suspect some form of insect bite, it does appear infectious.  Start Bactrim DS course twice daily x7 days.  She will return if no improvement/resolved.

## 2022-04-27 NOTE — Addendum Note (Signed)
Addended by: Alvina Chou on: 04/27/2022 05:13 PM   Modules accepted: Orders

## 2022-04-27 NOTE — Assessment & Plan Note (Addendum)
Above goal today, has been without antihypertensive treatment for years. No other recent BP reading.  We will plan to see her back in 1 month for blood pressure check and follow-up. Consider initiation of losartan 50 mg daily

## 2022-04-27 NOTE — Patient Instructions (Addendum)
Start metformin XR 500 mg tablets for diabetes.  Take 1 tablet by mouth every morning with breakfast for 1 week, then increase to 2 tablets by mouth every morning with breakfast thereafter.  Start taking her blood sugars with the freestyle libre sensor as discussed.  Please notify me if your insurance does not cover this.  You must work on [employer] as discussed.  Stop by the lab prior to leaving today. I will notify you of your results once received.   You will be contacted regarding your referral to the diabetic nutritionist.  Please let us know if you have not been contacted within two weeks.   Start Bactrim DS (sulfamethoxazole/trimethoprim) tablets for infection. Take 1 tablet by mouth twice daily for 7 days.  We will see you back in 1 month.  It was a pleasure to see you today!

## 2022-04-27 NOTE — Assessment & Plan Note (Signed)
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  Repeat lipid panel pending. Initiate treatment as results return.

## 2022-04-27 NOTE — Assessment & Plan Note (Signed)
Uncontrolled with A1c of 10.8.  No follow-up in nearly 3 years.  Continue Lantus 50 units twice daily, continue Novolin R 30 units 3 times daily with meals.  Start metformin XR 500 mg daily x1 week, then increase to metformin XR 1000 mg daily thereafter.  Prescription for freestyle libre 3 sensors sent to pharmacy.  Discussed instructions for use.  Lipid panel and renal function pending. We will likely resume statin therapy once labs return. Consider initiation of ARB next visit.  Close follow-up in 1 month.

## 2022-04-28 ENCOUNTER — Other Ambulatory Visit: Payer: Self-pay | Admitting: Primary Care

## 2022-04-28 ENCOUNTER — Telehealth: Payer: Self-pay | Admitting: Radiology

## 2022-04-28 DIAGNOSIS — R809 Proteinuria, unspecified: Secondary | ICD-10-CM

## 2022-04-28 DIAGNOSIS — I10 Essential (primary) hypertension: Secondary | ICD-10-CM

## 2022-04-28 LAB — CBC
HCT: 36 % (ref 35.0–45.0)
Hemoglobin: 13 g/dL (ref 11.7–15.5)
MCH: 31.7 pg (ref 27.0–33.0)
MCHC: 36.1 g/dL — ABNORMAL HIGH (ref 32.0–36.0)
MCV: 87.8 fL (ref 80.0–100.0)
MPV: 9.4 fL (ref 7.5–12.5)
Platelets: 460 10*3/uL — ABNORMAL HIGH (ref 140–400)
RBC: 4.1 10*6/uL (ref 3.80–5.10)
RDW: 14.8 % (ref 11.0–15.0)
WBC: 8.2 10*3/uL (ref 3.8–10.8)

## 2022-04-28 MED ORDER — LOSARTAN POTASSIUM 50 MG PO TABS: 50.0000 mg | ORAL_TABLET | Freq: Every day | ORAL | 0 refills | Status: DC

## 2022-04-28 NOTE — Telephone Encounter (Signed)
[place] lab rejected blood sample, patient needs to come back in for a redraw

## 2022-04-29 ENCOUNTER — Other Ambulatory Visit: Payer: No Typology Code available for payment source

## 2022-04-29 DIAGNOSIS — E1165 Type 2 diabetes mellitus with hyperglycemia: Secondary | ICD-10-CM

## 2022-04-29 DIAGNOSIS — E781 Pure hyperglyceridemia: Secondary | ICD-10-CM

## 2022-04-30 LAB — COMPREHENSIVE METABOLIC PANEL
AG Ratio: 1.3 (calc) (ref 1.0–2.5)
ALT: 9 U/L (ref 6–29)
AST: 10 U/L (ref 10–30)
Albumin: 4 g/dL (ref 3.6–5.1)
Alkaline phosphatase (APISO): 65 U/L (ref 31–125)
BUN: 14 mg/dL (ref 7–25)
CO2: 20 mmol/L (ref 20–32)
Calcium: 9.3 mg/dL (ref 8.6–10.2)
Chloride: 102 mmol/L (ref 98–110)
Creat: 0.55 mg/dL (ref 0.50–0.99)
Globulin: 3.1 g/dL (calc) (ref 1.9–3.7)
Glucose, Bld: 306 mg/dL — ABNORMAL HIGH (ref 65–99)
Potassium: 4.2 mmol/L (ref 3.5–5.3)
Sodium: 139 mmol/L (ref 135–146)
Total Bilirubin: 0.4 mg/dL (ref 0.2–1.2)
Total Protein: 7.1 g/dL (ref 6.1–8.1)

## 2022-04-30 LAB — LIPID PANEL
Cholesterol: 902 mg/dL — ABNORMAL HIGH (ref <200)
HDL: 25 mg/dL — ABNORMAL LOW (ref >=50)
Non-HDL Cholesterol (Calc): 877 mg/dL (calc) — ABNORMAL HIGH (ref <130)
Total CHOL/HDL Ratio: 36.1 (calc) — ABNORMAL HIGH (ref <5.0)
Triglycerides: 8699 mg/dL — ABNORMAL HIGH (ref <150)

## 2022-05-04 ENCOUNTER — Other Ambulatory Visit: Payer: Self-pay | Admitting: Primary Care

## 2022-05-04 DIAGNOSIS — E781 Pure hyperglyceridemia: Secondary | ICD-10-CM

## 2022-05-04 MED ORDER — ATORVASTATIN CALCIUM 40 MG PO TABS: 40.0000 mg | ORAL_TABLET | Freq: Every day | ORAL | 0 refills | Status: DC

## 2022-05-05 ENCOUNTER — Telehealth: Payer: Self-pay | Admitting: Registered Nurse

## 2022-05-05 ENCOUNTER — Encounter: Payer: Self-pay | Admitting: Registered Nurse

## 2022-05-05 DIAGNOSIS — E781 Pure hyperglyceridemia: Secondary | ICD-10-CM

## 2022-05-05 NOTE — Telephone Encounter (Signed)
Rx new from pcm atorvastatin 40mg  po daily filled from [facility] formulary today #90 and given to patient.  Patient will need new Rx for next fill.

## 2022-05-27 ENCOUNTER — Encounter: Payer: Self-pay | Admitting: Primary Care

## 2022-05-27 ENCOUNTER — Ambulatory Visit: Payer: No Typology Code available for payment source | Admitting: Primary Care

## 2022-05-27 VITALS — BP 134/78 | HR 100 | Temp 98.3°F | Ht 64.0 in | Wt 204.0 lb

## 2022-05-27 DIAGNOSIS — E1165 Type 2 diabetes mellitus with hyperglycemia: Secondary | ICD-10-CM

## 2022-05-27 DIAGNOSIS — E781 Pure hyperglyceridemia: Secondary | ICD-10-CM | POA: Diagnosis not present

## 2022-05-27 DIAGNOSIS — R809 Proteinuria, unspecified: Secondary | ICD-10-CM

## 2022-05-27 DIAGNOSIS — I10 Essential (primary) hypertension: Secondary | ICD-10-CM

## 2022-05-27 MED ORDER — FREESTYLE LIBRE 2 READER DEVI: 0 refills | Status: DC

## 2022-05-27 MED ORDER — FREESTYLE LIBRE 2 SENSOR MISC: 1 refills | Status: DC

## 2022-05-27 NOTE — Assessment & Plan Note (Addendum)
Unfortunately, the freestyle libre 3 sensors are not compatible with her smart phone.  She refuses to check her glucose levels with lancets and test strips. Prescription for freestyle libre 2 sensor and receiver sent to pharmacy.  She will update if this is cost prohibitive.  Continue Lantus 50 units twice daily, Novolin R 30 units 3 times daily with meals, metformin 1000 mg daily.  Testing for evidence of type 1 diabetes given her high levels of insulin with persistent hyperglycemia.  Foot exam today. Managed on ARB and statin. Follow-up in 2 months.

## 2022-05-27 NOTE — Assessment & Plan Note (Signed)
Uncontrolled with triglyceride level in the 8000s during last visit.  Continue atorvastatin 40 mg daily. Repeat lipid panel pending.  Follow-up with the lipid clinic as scheduled.

## 2022-05-27 NOTE — Progress Notes (Signed)
Subjective:    Patient ID: [name], female    DOB: 25-Jan-1981, 41 y.o.   MRN: [id number]  Diabetes Pertinent negatives for hypoglycemia include no dizziness. Pertinent negatives for diabetes include no chest pain.    JANELYS GLASSNER is a very pleasant 41 y.o. female with a history of hyperlipidemia, hypertriglyceridemia, type 2 diabetes, hypertension, neuropathy, anxiety depression who presents today for follow-up.  1) Type 2 Diabetes:   Current medications include: Lantus 50 units twice daily, Novolin R 30 units 3 times daily, metformin XR 1000 mg daily.  She is checking her blood glucose 0 times daily. She does not have a glucometer. She was prescribed the [facility] 3 during her last visit. She picked up the sensors but they are not compatible with her smart phone. She does not want to check glucose by pricking her fingers as her hands are already in pain.  Last A1C: 10.8 from Apr 27, 2022. Last Eye Exam: Due Last Foot Exam: Due Pneumonia Vaccination: 2020 Urine Microalbumin: None.  Managed on ARB Statin: Atorvastatin  Dietary changes since last visit: She does not cook, she uses an air fryer mostly. She mostly eats chicken with sauces, meatballs with sauce, fast food recently, veggies, string cheese, ice cream occasionally.    Exercise: None. Active at work.   2) Essential Hypertension: Currently managed on losartan 50 mg which was initiated 1 month ago for hypertension and evidence of microalbumin in the urine.  She denies chest pain, shortness of breath, dizziness, headaches.  BP Readings from Last 3 Encounters:  05/27/22 134/78  04/27/22 (!) 148/82  01/25/21 (!) 144/87   3) Hyperlipidemia/Hypertriglyceridemia: Currently managed on atorvastatin 40 mg which is initiated 1 month ago.  Lipid panel 1 month ago with triglycerides of 8699, total cholesterol 902.  She was also referred to the lipid clinic for ongoing treatment.  Since her last visit she is  compliant to atorvastatin 40 mg daily.  She has an appoint with the lipid clinic in August.  She tries to work on her diet, however she does not have much of a kitchen, uses an air [place] mostly.  She tries to make healthier choices when eating from the air [place].  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  Review of Systems  Respiratory:  Negative for shortness of breath.   Cardiovascular:  Negative for chest pain.  Skin:        Xanthomatosis   Neurological:  Negative for dizziness.         Past Medical History:  Diagnosis Date   Diabetes mellitus    Hypercholesteremia    Hypertension    Pancreatitis    Pneumonia due to COVID-19 virus 09/02/2020    Social History   Socioeconomic History   Marital status: Single    Spouse name: Not on file   Number of children: Not on file   Years of education: Not on file   Highest education level: Not on file  Occupational History   Not on file  Tobacco Use   Smoking status: Former   Smokeless tobacco: Never  Substance and Sexual Activity   Alcohol use: No   Drug use: No   Sexual activity: Never  Other Topics Concern   Not on file  Social History Narrative   Not on file   Social Determinants of Health   Financial Resource Strain: Not on file  Food Insecurity: Not on file  Transportation  Needs: Not on file  Physical Activity: Not on file  Stress: Not on file  Social Connections: Not on file  Intimate Partner Violence: Not on file    Past Surgical History:  Procedure Laterality Date   KNEE SURGERY      Family History  Problem Relation Age of Onset   Cancer Other    Diabetes Other    Hypertension Other    Cancer Paternal Grandfather    Depression Mother    Diabetes Father    Hypertension Father    Depression Brother     No Known Allergies  Current Outpatient Medications on File Prior to Visit  Medication Sig Dispense Refill   atorvastatin (LIPITOR) 40 MG  tablet Take 1 tablet (40 mg total) by mouth daily. for cholesterol. 90 tablet 0   insulin glargine (LANTUS) 100 UNIT/ML Solostar Pen Inject 50 Units into the skin 2 (two) times daily. 15 mL 5   insulin regular (NOVOLIN R) 100 units/mL injection Inject 0.28 mLs (28 Units total) into the skin 3 (three) times daily before meals. 10 mL 5   losartan (COZAAR) 50 MG tablet Take 1 tablet (50 mg total) by mouth daily. for blood pressure. 30 tablet 0   metFORMIN (GLUCOPHAGE-XR) 500 MG 24 hr tablet Take 1 tablet by mouth once daily for 1 week, then increase to 2 tablets once daily thereafter for diabetes. 180 tablet 0   No current facility-administered medications on file prior to visit.    BP 134/78   Pulse 100   Temp 98.3 F (36.8 C) (Oral)   Ht 5\' 4"  (1.626 m)   Wt 204 lb (92.5 kg)   SpO2 98%   BMI 35.02 kg/m  Objective:   Physical Exam Cardiovascular:     Rate and Rhythm: Normal rate and regular rhythm.  Pulmonary:     Effort: Pulmonary effort is normal.     Breath sounds: Normal breath sounds.  Musculoskeletal:     Cervical back: Neck supple.  Skin:    General: Skin is warm and dry.     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  Continue losartan 50 mg daily for now. Discussed to work on her diet by eating less processed/salty foods. Checking BMP today.  Follow-up in 2 months.      Relevant Orders   Basic metabolic panel     Endocrine   Type 2 diabetes mellitus with hyperglycemia (HCC) - Primary    Unfortunately, the freestyle libre 3 sensors are not compatible with her smart phone.  She refuses to check her glucose levels with lancets and test strips. Prescription for freestyle libre 2 sensor and receiver sent to pharmacy.  She will update if this is cost prohibitive.  Continue Lantus 50 units twice  daily, Novolin R 30 units 3 times daily with meals, metformin 1000 mg daily.  Testing for evidence of type 1 diabetes given her high levels of insulin with persistent hyperglycemia.  Foot exam today. Managed on ARB and statin. Follow-up in 2 months.      Relevant Medications   Continuous Blood Gluc Sensor (FREESTYLE LIBRE 2 SENSOR) MISC   Continuous Blood Gluc Receiver (FREESTYLE LIBRE 2 READER) DEVI   Other Relevant Orders   C-peptide   Glutamic acid decarboxylase auto  abs     Other   Hypertriglyceridemia    Uncontrolled with triglyceride level in the 8000s during last visit.  Continue atorvastatin 40 mg daily. Repeat lipid panel pending.  Follow-up with the lipid clinic as scheduled.      Relevant Orders   Lipid panel       Pleas Koch, NP

## 2022-05-27 NOTE — Assessment & Plan Note (Signed)
Improved.  Continue losartan 50 mg daily for now. Discussed to work on her diet by eating less processed/salty foods. Checking BMP today.  Follow-up in 2 months.

## 2022-05-27 NOTE — Patient Instructions (Signed)
Start using the [employer] 2.  Pick up both the sensors and the receiver.   Stop by the lab prior to leaving today. I will notify you of your results once received.   Please send me blood sugar readings via MyChart in 2 weeks.  Please schedule a follow up visit for 2 months.  It was a pleasure to see you today!

## 2022-05-27 NOTE — Addendum Note (Signed)
Addended by: Ilda Foil on: 05/27/2022 05:04 PM   Modules accepted: Orders

## 2022-05-28 LAB — BASIC METABOLIC PANEL
BUN: 18 mg/dL (ref 7–25)
CO2: 20 mmol/L (ref 20–32)
Calcium: 8.9 mg/dL (ref 8.6–10.2)
Chloride: 100 mmol/L (ref 98–110)
Creat: 0.94 mg/dL (ref 0.50–0.99)
Glucose, Bld: 221 mg/dL — ABNORMAL HIGH (ref 65–99)
Potassium: 4 mmol/L (ref 3.5–5.3)
Sodium: 133 mmol/L — ABNORMAL LOW (ref 135–146)

## 2022-05-28 LAB — LIPID PANEL
Cholesterol: 522 mg/dL — ABNORMAL HIGH (ref <200)
HDL: 24 mg/dL — ABNORMAL LOW (ref >=50)
Non-HDL Cholesterol (Calc): 498 mg/dL (calc) — ABNORMAL HIGH (ref <130)
Total CHOL/HDL Ratio: 21.8 (calc) — ABNORMAL HIGH (ref <5.0)
Triglycerides: 3655 mg/dL — ABNORMAL HIGH (ref <150)

## 2022-05-31 LAB — GLUTAMIC ACID DECARBOXYLASE AUTO ABS: Glutamic Acid Decarb Ab: <5 IU/mL (ref <5)

## 2022-05-31 LAB — C-PEPTIDE: C-Peptide: 4.17 ng/mL — ABNORMAL HIGH (ref 0.80–3.85)

## 2022-06-07 MED ORDER — LOSARTAN POTASSIUM 50 MG PO TABS: 50.0000 mg | ORAL_TABLET | Freq: Every day | ORAL | 0 refills | Status: DC

## 2022-06-17 MED ORDER — DEXCOM G6 SENSOR MISC: 5 refills | Status: DC

## 2022-06-30 ENCOUNTER — Ambulatory Visit: Payer: No Typology Code available for payment source | Admitting: Primary Care

## 2022-06-30 ENCOUNTER — Encounter: Payer: Self-pay | Admitting: Primary Care

## 2022-06-30 VITALS — BP 138/76 | HR 82 | Temp 98.3°F | Ht 64.0 in | Wt 204.0 lb

## 2022-06-30 DIAGNOSIS — I10 Essential (primary) hypertension: Secondary | ICD-10-CM | POA: Diagnosis not present

## 2022-06-30 DIAGNOSIS — Z23 Encounter for immunization: Secondary | ICD-10-CM | POA: Diagnosis not present

## 2022-06-30 DIAGNOSIS — E1165 Type 2 diabetes mellitus with hyperglycemia: Secondary | ICD-10-CM

## 2022-06-30 LAB — POCT GLYCOSYLATED HEMOGLOBIN (HGB A1C): Hemoglobin A1C: 8.4 % — AB (ref 4.0–5.6)

## 2022-06-30 MED ORDER — LOSARTAN POTASSIUM 100 MG PO TABS: 100.0000 mg | ORAL_TABLET | Freq: Every day | ORAL | 1 refills | Status: DC

## 2022-06-30 MED ORDER — METFORMIN HCL ER 500 MG PO TB24: 1000.0000 mg | ORAL_TABLET | Freq: Two times a day (BID) | ORAL | 1 refills | Status: DC

## 2022-06-30 NOTE — Progress Notes (Signed)
Subjective:    Patient ID: [name], female    DOB: [birth year], 41 y.o.   MRN: [id number]  Diabetes Pertinent negatives for hypoglycemia include no dizziness. Pertinent negatives for diabetes include no chest pain.    [name] is a very pleasant 41 y.o. female with a history of hypertension, type 2 diabetes, neuropathy, pancreatitis, hypertriglyceridemia, anxiety/depression who presents today for follow-up of diabetes. She is also due for a tetanus shot.  1) Type 2 Diabetes:  Current medications include: Lantus 50 units twice daily, Novolin R 28 units TID with meals, metformin XR 1000 mg daily.  She is checking her blood glucose continuously and is getting readings in the mid 150's. Some readings in the mid 200's.   Her phone is not compatible with Freestyle Libre 3 sensors. The Dexcom sensors are cost prohibitive. The Freestyle receiver was sent to her pharmacy in late June 2023  Last A1C: 10.8 in May 2023, 8.4 today Last Eye Exam: Due Last Foot Exam: UTD Pneumonia Vaccination: 2020 Urine Microalbumin: None. Managed on ARB Statin: atorvastatin   Dietary changes since last visit: She has been grilling more food. Less fried foods. Working to cut back on carbs.   Exercise: None. Active at work.   2) Essential Hypertension: Currently managed on losartan 50 mg. She does not check her BP at home. She denies chest pain, headaches, dizziness.   BP Readings from Last 3 Encounters:  06/30/22 138/76  05/27/22 134/78  04/27/22 (!) 148/82      Review of Systems  Eyes:  Negative for visual disturbance.  Respiratory:  Negative for shortness of breath.   Cardiovascular:  Negative for chest pain.  Neurological:  Negative for dizziness.         Past Medical History:  Diagnosis Date   Diabetes mellitus    Hypercholesteremia    Hypertension    Pancreatitis    Pneumonia due to COVID-19 virus 09/02/2020    Social History   Socioeconomic History   Marital status:  Single    Spouse name: Not on file   Number of children: Not on file   Years of education: Not on file   Highest education level: Not on file  Occupational History   Not on file  Tobacco Use   Smoking status: Former   Smokeless tobacco: Never  Substance and Sexual Activity   Alcohol use: No   Drug use: No   Sexual activity: Never  Other Topics Concern   Not on file  Social History Narrative   Not on file   Social Determinants of Health   Financial Resource Strain: Not on file  Food Insecurity: Not on file  Transportation Needs: Not on file  Physical Activity: Not on file  Stress: Not on file  Social Connections: Not on file  Intimate Partner Violence: Not on file    Past Surgical History:  Procedure Laterality Date   KNEE SURGERY      Family History  Problem Relation Age of Onset   Cancer Other    Diabetes Other    Hypertension Other    Cancer Paternal Grandfather    Depression Mother    Diabetes Father    Hypertension Father    Depression Brother     No Known Allergies  Current Outpatient Medications on File Prior to Visit  Medication Sig Dispense Refill   atorvastatin (LIPITOR) 40 MG tablet Take 1 tablet (40 mg total) by mouth daily. for cholesterol. 90 tablet 0  insulin glargine (LANTUS) 100 UNIT/ML Solostar Pen Inject 50 Units into the skin 2 (two) times daily. 15 mL 5   insulin regular (NOVOLIN R) 100 units/mL injection Inject 0.28 mLs (28 Units total) into the skin 3 (three) times daily before meals. 10 mL 5   No current facility-administered medications on file prior to visit.    BP 138/76   Pulse 82   Temp 98.3 F (36.8 C) (Oral)   Ht 5\' 4"  (1.626 m)   Wt 204 lb (92.5 kg)   SpO2 96%   BMI 35.02 kg/m  Objective:   Physical Exam Cardiovascular:     Rate and Rhythm: Normal rate and regular rhythm.  Pulmonary:     Effort: Pulmonary effort is normal.     Breath sounds: Normal breath sounds.  Musculoskeletal:     Cervical back: Neck  supple.  Skin:    General: Skin is warm and dry.           Assessment & Plan:   Problem List Items Addressed This Visit       Cardiovascular and Mediastinum   Essential hypertension    Remains borderline today.  Increase losartan to 100 mg daily. New Rx sent to pharmacy.  Follow up in 3 months.      Relevant Medications   losartan (COZAAR) 100 MG tablet     Endocrine   Type 2 diabetes mellitus with hyperglycemia (HCC) - Primary    Improved, not quite at goal.  Continue Lantus 50 units BID, Novolog 28 units TID. Increase Metformin XR to 1000 mg BID.   Managed on statin and ARB. Pneumonia vaccine UTD. Foot exam UTD. Discussed to schedule eye exam.  She will be updating her phone soon so that she can start using Freestyle Libre 3 sensors. She will notify once she needs refills  Follow up in 3 months.      Relevant Medications   metFORMIN (GLUCOPHAGE-XR) 500 MG 24 hr tablet   losartan (COZAAR) 100 MG tablet   Other Relevant Orders   POCT glycosylated hemoglobin (Hb A1C) (Completed)       , NP

## 2022-06-30 NOTE — Assessment & Plan Note (Addendum)
Improved, not quite at goal.  Continue Lantus 50 units BID, Novolog 28 units TID. Increase Metformin XR to 1000 mg BID.   Managed on statin and ARB. Pneumonia vaccine UTD. Foot exam UTD. Discussed to schedule eye exam.  She will be updating her phone soon so that she can start using Freestyle Libre 3 sensors. She will notify once she needs refills  Follow up in 3 months.

## 2022-06-30 NOTE — Assessment & Plan Note (Addendum)
Remains borderline today.  Increase losartan to 100 mg daily. New Rx sent to pharmacy.  Follow up in 3 months.

## 2022-06-30 NOTE — Patient Instructions (Signed)
We increased the dose of your metformin tablets for diabetes. Take 2 tablets by mouth twice daily with meals.  We increased the dose of your losartan tablets for blood pressure to 100 mg. Take 1 tablet by mouth once daily when you start the new prescription.  Please schedule a follow up visit for 3 months.  It was a pleasure to see you today!

## 2022-06-30 NOTE — Addendum Note (Signed)
Addended by: Donnamarie Poag on: 06/30/2022 04:02 PM   Modules accepted: Orders

## 2022-07-17 ENCOUNTER — Other Ambulatory Visit: Payer: Self-pay | Admitting: Primary Care

## 2022-07-17 DIAGNOSIS — E1165 Type 2 diabetes mellitus with hyperglycemia: Secondary | ICD-10-CM

## 2022-07-18 ENCOUNTER — Encounter: Payer: Self-pay | Admitting: Dietician

## 2022-07-18 ENCOUNTER — Encounter: Payer: No Typology Code available for payment source | Attending: Primary Care | Admitting: Dietician

## 2022-07-18 DIAGNOSIS — E1165 Type 2 diabetes mellitus with hyperglycemia: Secondary | ICD-10-CM | POA: Diagnosis present

## 2022-07-18 DIAGNOSIS — Z794 Long term (current) use of insulin: Secondary | ICD-10-CM | POA: Insufficient documentation

## 2022-07-18 NOTE — Progress Notes (Signed)
Diabetes Self-Management Education  Visit Type: First/Initial  Appt. Start Time: 1410 Appt. End Time: 1515  07/18/2022  [name], identified by name and date of birth, is a 41 y.o. female with a diagnosis of Diabetes: Type 2.   ASSESSMENT  Patient is here today alone. She states she has not had diabetes education in the past but feels like at this point she should know more about her diabetes. She states she wants to know how to eat better and that she was used to people cooking for her in the past when she lived with her family but states now she mostly just puts things in the air fryer.   History includes:  type 2 diabetes, HLD, HTN, Hypertriglyceridemia Medications include:  lantus 50 units in morning and before bed, Novolin R 30 units before meals, metformin, Labs noted to include: A1C 8.4% 06/30/2022, TG 3655 05/27/2022 (appt to see lipid specialist scheduled) CGM: Dexcom G6, switching to [employer] 3.   Patient lives with boyfriend. Her boyfriend does most of the cooking. She doesn't eat until she gets home from work at [employer]. She used to pack her lunch to work but got out of the habit.   Height 5\' 4"  (1.626 m), weight 202 lb 8 oz (91.9 kg). Body mass index is 34.76 kg/m.   Diabetes Self-Management Education - 07/18/22 1420       Visit Information   Visit Type First/Initial      Initial Visit   Diabetes Type Type 2    Date Diagnosed 2002    Are you currently following a meal plan? No    Are you taking your medications as prescribed? Yes      Health Coping   How would you rate your overall health? Poor      Psychosocial Assessment   Patient Belief/Attitude about Diabetes Defeat/Burnout    What is the hardest part about your diabetes right now, causing you the most concern, or is the most worrisome to you about your diabetes?   Making healty food and beverage choices    Self-care barriers None    Self-management support Doctor's office;Friends    Other persons present  Patient    Patient Concerns Nutrition/Meal planning    Special Needs None    Preferred Learning Style No preference indicated    Learning Readiness Ready    How often do you need to have someone help you when you read instructions, pamphlets, or other written materials from your doctor or pharmacy? 1 - Never    What is the last grade level you completed in school? associates      Pre-Education Assessment   Patient understands the diabetes disease and treatment process. Needs Review    Patient understands incorporating nutritional management into lifestyle. Needs Review    Patient undertands incorporating physical activity into lifestyle. Needs Review    Patient understands using medications safely. Needs Review    Patient understands monitoring blood glucose, interpreting and using results Needs Review    Patient understands prevention, detection, and treatment of acute complications. Needs Review    Patient understands prevention, detection, and treatment of chronic complications. Needs Review    Patient understands how to develop strategies to address psychosocial issues. Needs Review    Patient understands how to develop strategies to promote health/change behavior. Needs Review      Complications   Last HgB A1C per patient/outside source 8.6 %   06/30/2022   How often do you check your blood  sugar? > 4 times/day    Fasting Blood glucose range (mg/dL) >200    Number of hypoglycemic episodes per month 0    Number of hyperglycemic episodes ( >200mg /dL): Daily    Can you tell when your blood sugar is high? Yes    What do you do if your blood sugar is high? drinks water    Have you had a dilated eye exam in the past 12 months? No    Have you had a dental exam in the past 12 months? No    Are you checking your feet? Yes    How many days per week are you checking your feet? 6      Dietary Intake   Breakfast skips    Snack (morning) none    Lunch skips    Dinner chicken and cheese  quesadillas with onions and peppers OR tacos OR sandwich in tortilla   4pm after work   Snack (evening) bowl of cereal OR soup    Beverage(s) water, diet soda      Activity / Exercise   Activity / Exercise Type ADL's      Patient Education   Previous Diabetes Education No    Disease Pathophysiology Definition of diabetes, type 1 and 2, and the diagnosis of diabetes;Explored patient's options for treatment of their diabetes    Healthy Eating Role of diet in the treatment of diabetes and the relationship between the three main macronutrients and blood glucose level;Food label reading, portion sizes and measuring food.;Plate Method;Reviewed blood glucose goals for pre and post meals and how to evaluate the patients' food intake on their blood glucose level.;Meal timing in regards to the patients' current diabetes medication.    Being Active Role of exercise on diabetes management, blood pressure control and cardiac health.    Medications Reviewed patients medication for diabetes, action, purpose, timing of dose and side effects.    Monitoring Identified appropriate SMBG and/or A1C goals.;Daily foot exams;Yearly dilated eye exam    Acute complications Discussed and identified patients' prevention, symptoms, and treatment of hyperglycemia.    Chronic complications Relationship between chronic complications and blood glucose control;Assessed and discussed foot care and prevention of foot problems;Identified and discussed with patient  current chronic complications;Nephropathy, what it is, prevention of, the use of ACE, ARB's and early detection of through urine microalbumia.    Diabetes Stress and Support Identified and addressed patients feelings and concerns about diabetes;Worked with patient to identify barriers to care and solutions;Role of stress on diabetes;Helped patient identify a support system for diabetes management    Lifestyle and Health Coping Lifestyle issues that need to be addressed for  better diabetes care      Individualized Goals (developed by patient)   Nutrition General guidelines for healthy choices and portions discussed;Follow meal plan discussed    Physical Activity Exercise 5-7 days per week;30 minutes per day    Medications take my medication as prescribed    Monitoring  Consistenly use CGM    Problem Solving Eating Pattern    Reducing Risk examine blood glucose patterns;do foot checks daily      Outcomes   Expected Outcomes Demonstrated interest in learning. Expect positive outcomes    Future DMSE 2 months    Program Status Not Completed             Individualized Plan for Diabetes Self-Management Training:   Learning Objective:  Patient will have a greater understanding of diabetes self-management. Patient education plan is to  attend individual and/or group sessions per assessed needs and concerns.   Plan:   Patient Instructions  Consider packing lunch for work. -Tuna salad or chicken salad packet with whole grain crackers such as triscuits or wheat thins -Low carb tortillas with peanut butter and banana -Cottage cheese and fruit -Veggie wrap in whole wheat or low carb tortilla with hummus -Pack your lunch the night before -Keep your insulin with you at work  Aim to get 150 minutes of physical activity per week. -Consider utilizing the gym at your workplace -Walking is great exercise -Walking after eating meals can help keep your blood sugar down    Expected Outcomes:  Demonstrated interest in learning. Expect positive outcomes  Education material provided: ADA - How to Thrive: A Guide for Your Journey with Diabetes and Meal plan card  If problems or questions, patient to contact team via:  Phone  Future DSME appointment: 2 months

## 2022-07-18 NOTE — Patient Instructions (Signed)
Consider packing lunch for work. -Tuna salad or chicken salad packet with whole grain crackers such as triscuits or wheat thins -Low carb tortillas with peanut butter and banana -Cottage cheese and fruit -Veggie wrap in whole wheat or low carb tortilla with hummus -Pack your lunch the night before -Keep your insulin with you at work  Aim to get 150 minutes of physical activity per week. -Consider utilizing the gym at your workplace -Walking is great exercise -Walking after eating meals can help keep your blood sugar down

## 2022-07-29 ENCOUNTER — Telehealth: Payer: Self-pay | Admitting: Primary Care

## 2022-07-29 NOTE — Telephone Encounter (Signed)
Patient called in stating that her boyfriend tested positive for HCV. She wanting to have labs done to get tested to see if she has it. Please advise. Thank you!

## 2022-07-29 NOTE — Telephone Encounter (Signed)
Called pt and left a message to call the office. Pt will need to schedule an office visit to be seen with one of the providers.

## 2022-08-09 ENCOUNTER — Other Ambulatory Visit (HOSPITAL_COMMUNITY)
Admission: RE | Admit: 2022-08-09 | Discharge: 2022-08-09 | Disposition: A | Discharge status: Home / Self Care | Payer: No Typology Code available for payment source | Source: Ambulatory Visit | Attending: Family | Admitting: Family

## 2022-08-09 ENCOUNTER — Ambulatory Visit: Payer: No Typology Code available for payment source | Admitting: Family

## 2022-08-09 ENCOUNTER — Encounter: Payer: Self-pay | Admitting: Family

## 2022-08-09 VITALS — BP 138/86 | HR 86 | Temp 98.1°F | Resp 16 | Ht 64.0 in | Wt 195.4 lb

## 2022-08-09 DIAGNOSIS — Z205 Contact with and (suspected) exposure to viral hepatitis: Secondary | ICD-10-CM

## 2022-08-09 DIAGNOSIS — Z113 Encounter for screening for infections with a predominantly sexual mode of transmission: Secondary | ICD-10-CM | POA: Insufficient documentation

## 2022-08-09 DIAGNOSIS — E781 Pure hyperglyceridemia: Secondary | ICD-10-CM | POA: Diagnosis not present

## 2022-08-09 MED ORDER — ICOSAPENT ETHYL 1 G PO CAPS: 2.0000 g | ORAL_CAPSULE | Freq: Two times a day (BID) | ORAL | 2 refills | Status: DC

## 2022-08-09 NOTE — Assessment & Plan Note (Signed)
Continue atorvastatin 40 mg once daily Sent in new RX for vascepa 2 g to see If approved Pt advised to start taking if approved and f/u with kate for f/u on elevated triglycerides Consider cardiology referral in the future if no improvement

## 2022-08-09 NOTE — Assessment & Plan Note (Signed)
Testing today for hepatitis C as well as other STDS  Pending results

## 2022-08-09 NOTE — Progress Notes (Signed)
Established Patient Office Visit  Subjective:  Patient ID: [name], female    DOB: [birth year]  Age: 41 y.o. MRN: [id number]  CC:  Chief Complaint  Patient presents with   Hepatitis C    Wants to be tested    HPI [name] is here today for follow up.   Pt is with acute concerns. She states that she recently found out her boyfriend has hepatitis C he was trying to donate plasma and tested positive. She does not have any ruq abdominal pain.she would like to also be tested for all other STDS as well. To her knowledge she has never been tested for hepatitis C.   Elevated triglycerdies: h/o pancreatitis, pt denies luq tenderness at current. Was without statins for a while, after last lab draw in June was started back on atorvastatin 40 mg once daily. Triglycerides still in 3000's. Pt with no h/o seeing cardiology in the past. Lab Results  Component Value Date   CHOL 522 (H) 05/27/2022   HDL 24 (L) 05/27/2022   LDLCALC  05/27/2022     Comment:     . LDL cholesterol not calculated. Triglyceride levels greater than 400 mg/dL invalidate calculated LDL results. . Reference range: <100 . Desirable range <100 mg/dL for primary prevention;   <70 mg/dL for patients with CHD or diabetic patients  with > or = 2 CHD risk factors. Marland Kitchen LDL-C is now calculated using the Martin-Hopkins  calculation, which is a validated novel method providing  better accuracy than the Friedewald equation in the  estimation of LDL-C.  Horald Pollen et al. Lenox Ahr. 5573;220(25): 2061-2068  (http://education.[facility].com/faq/FAQ164)    LDLDIRECT 64.0 05/27/2019   TRIG 3,655 (H) 05/27/2022   CHOLHDL 21.8 (H) 05/27/2022     Past Medical History:  Diagnosis Date   Diabetes mellitus    Hypercholesteremia    Hypertension    Pancreatitis    Pneumonia due to COVID-19 virus 09/02/2020    Past Surgical History:  Procedure Laterality Date   KNEE SURGERY      Family History  Problem Relation  Age of Onset   Cancer Other    Diabetes Other    Hypertension Other    Cancer Paternal Grandfather    Depression Mother    Diabetes Father    Hypertension Father    Depression Brother     Social History   Socioeconomic History   Marital status: Single    Spouse name: Not on file   Number of children: Not on file   Years of education: Not on file   Highest education level: Not on file  Occupational History   Not on file  Tobacco Use   Smoking status: Former   Smokeless tobacco: Never  Substance and Sexual Activity   Alcohol use: No   Drug use: No   Sexual activity: Never  Other Topics Concern   Not on file  Social History Narrative   Not on file   Social Determinants of Health   Financial Resource Strain: Not on file  Food Insecurity: Not on file  Transportation Needs: Not on file  Physical Activity: Not on file  Stress: Not on file  Social Connections: Not on file  Intimate Partner Violence: Not on file    Outpatient Medications Prior to Visit  Medication Sig Dispense Refill   atorvastatin (LIPITOR) 40 MG tablet Take 1 tablet (40 mg total) by mouth daily. for cholesterol. 90 tablet 0   insulin glargine (LANTUS) 100 UNIT/ML  Solostar Pen Inject 50 Units into the skin 2 (two) times daily. 15 mL 5   insulin regular (NOVOLIN R) 100 units/mL injection Inject 0.28 mLs (28 Units total) into the skin 3 (three) times daily before meals. 10 mL 5   losartan (COZAAR) 100 MG tablet Take 1 tablet (100 mg total) by mouth daily. for blood pressure. 90 tablet 1   metFORMIN (GLUCOPHAGE-XR) 500 MG 24 hr tablet Take 2 tablets (1,000 mg total) by mouth 2 (two) times daily with a meal. for diabetes. 360 tablet 1   No facility-administered medications prior to visit.    No Known Allergies       Objective:    Physical Exam Constitutional:      General: She is not in acute distress.    Appearance: Normal appearance. She is obese. She is not ill-appearing, toxic-appearing or  diaphoretic.  Pulmonary:     Effort: Pulmonary effort is normal.  Abdominal:     General: Abdomen is flat. There is no distension.     Palpations: There is no mass.     Tenderness: There is no abdominal tenderness. There is no guarding.  Neurological:     Mental Status: She is alert.      BP 138/86   Pulse 86   Temp 98.1 F (36.7 C)   Resp 16   Ht 5\' 4"  (1.626 m)   Wt 195 lb 6 oz (88.6 kg)   SpO2 98%   BMI 33.54 kg/m  Wt Readings from Last 3 Encounters:  08/09/22 195 lb 6 oz (88.6 kg)  07/18/22 202 lb 8 oz (91.9 kg)  06/30/22 204 lb (92.5 kg)     Health Maintenance Due  Topic Date Due   Hepatitis C Screening  Never done   PAP SMEAR-Modifier  Never done   COVID-19 Vaccine (4 - Moderna series) 08/27/2021   INFLUENZA VACCINE  06/28/2022    There are no preventive care reminders to display for this patient.  Lab Results  Component Value Date   TSH 3.59 03/27/2019   Lab Results  Component Value Date   WBC 8.2 04/27/2022   HGB 13.0 04/27/2022   HCT 36.0 04/27/2022   MCV 87.8 04/27/2022   PLT 460 (H) 04/27/2022   Lab Results  Component Value Date   NA 133 (L) 05/27/2022   K 4.0 05/27/2022   CO2 20 05/27/2022   GLUCOSE 221 (H) 05/27/2022   BUN 18 05/27/2022   CREATININE 0.94 05/27/2022   BILITOT 0.4 04/29/2022   ALKPHOS 47 09/07/2020   AST 10 04/29/2022   ALT 9 04/29/2022   PROT 7.1 04/29/2022   ALBUMIN 2.6 (L) 09/07/2020   CALCIUM 8.9 05/27/2022   ANIONGAP 11 09/07/2020   GFR 125.53 03/27/2019   Lab Results  Component Value Date   CHOL 522 (H) 05/27/2022   Lab Results  Component Value Date   HDL 24 (L) 05/27/2022   Lab Results  Component Value Date   [facility]  05/27/2022     Comment:     . LDL cholesterol not calculated. Triglyceride levels greater than 400 mg/dL invalidate calculated LDL results. . Reference range: <100 . Desirable range <100 mg/dL for primary prevention;   <70 mg/dL for patients with CHD or diabetic patients  with >  or = 2 CHD risk factors. 05/29/2022 LDL-C is now calculated using the Martin-Hopkins  calculation, which is a validated novel method providing  better accuracy than the Friedewald equation in the  estimation of LDL-C.  Horald Pollen et al. Lenox Ahr. 0998;338(25): 2061-2068  (http://education.[facility].com/faq/FAQ164)    Lab Results  Component Value Date   TRIG 3,655 (H) 05/27/2022   Lab Results  Component Value Date   CHOLHDL 21.8 (H) 05/27/2022   Lab Results  Component Value Date   HGBA1C 8.4 (A) 06/30/2022      Assessment & Plan:   Problem List Items Addressed This Visit       Other   Hypertriglyceridemia    Continue atorvastatin 40 mg once daily Sent in new RX for vascepa 2 g to see If approved Pt advised to start taking if approved and f/u with kate for f/u on elevated triglycerides Consider cardiology referral in the future if no improvement       Relevant Medications   icosapent Ethyl (VASCEPA) 1 g capsule   Screening examination for STD (sexually transmitted disease)    Safe sex d/w pt .  Std panel ordered today. Pending results.        Relevant Orders   RPR   HIV Antibody (routine testing w rflx)   Herpes Simplex Virus 1 and 2 (IgG), with Reflex to HSV-2 Inhibition   Urine cytology ancillary only   Hepatitis C antibody   Exposure to hepatitis C - Primary    Testing today for hepatitis C as well as other STDS  Pending results      Relevant Orders   RPR   HIV Antibody (routine testing w rflx)   Herpes Simplex Virus 1 and 2 (IgG), with Reflex to HSV-2 Inhibition   Urine cytology ancillary only   Hepatitis C antibody    Meds ordered this encounter  Medications   icosapent Ethyl (VASCEPA) 1 g capsule    Sig: Take 2 capsules (2 g total) by mouth 2 (two) times daily.    Dispense:  120 capsule    Refill:  2    On atorvastatin 40 mg once daily Triglycerdies >500, in the 3000's    Order Specific Question:   Supervising Provider    Answer:   Kerby Nora E  [[id number]]    Follow-up: Return for as scheduled with Jae Dire in November.    Mort Sawyers, FNP

## 2022-08-09 NOTE — Patient Instructions (Signed)
I recommend you continue with atorvastatin, and start vascepa. I have sent this to the pharmacy to see if it will be approved  This is for your highly elevated triglycerides.    Regards,   Mort Sawyers FNP-C

## 2022-08-09 NOTE — Assessment & Plan Note (Signed)
Safe sex d/w pt .  Std panel ordered today. Pending results.   

## 2022-08-10 LAB — URINE CYTOLOGY ANCILLARY ONLY
Chlamydia: NEGATIVE
Comment: NEGATIVE
Comment: NEGATIVE
Comment: NORMAL
Neisseria Gonorrhea: NEGATIVE
Trichomonas: NEGATIVE

## 2022-08-11 ENCOUNTER — Encounter: Payer: Self-pay | Admitting: Family

## 2022-08-16 LAB — HCV RNA,QUANTITATIVE REAL TIME PCR
HCV Quantitative Log: <1.18 Log IU/mL
HCV RNA, PCR, QN: <15 IU/mL

## 2022-08-16 LAB — HERPES SIMPLEX VIRUS 1 AND 2 (IGG),REFLEX HSV-2 INHIBITION
HAV 1 IGG,TYPE SPECIFIC AB: <0.9 index
HSV 2 IGG,TYPE SPECIFIC AB: <0.9 index

## 2022-08-16 LAB — HEPATITIS C ANTIBODY: Hepatitis C Ab: BORDERLINE — AB

## 2022-08-16 LAB — HIV ANTIBODY (ROUTINE TESTING W REFLEX): HIV 1&2 Ab, 4th Generation: NONREACTIVE

## 2022-08-16 LAB — RPR: RPR Ser Ql: NONREACTIVE

## 2022-09-19 ENCOUNTER — Ambulatory Visit: Payer: No Typology Code available for payment source | Admitting: Dietician

## 2022-09-29 ENCOUNTER — Ambulatory Visit (HOSPITAL_BASED_OUTPATIENT_CLINIC_OR_DEPARTMENT_OTHER): Payer: No Typology Code available for payment source | Admitting: Internal Medicine

## 2022-09-30 ENCOUNTER — Encounter: Payer: No Typology Code available for payment source | Admitting: Primary Care

## 2022-11-29 ENCOUNTER — Encounter: Payer: Self-pay | Admitting: Primary Care

## 2022-11-29 ENCOUNTER — Ambulatory Visit: Payer: No Typology Code available for payment source | Admitting: Primary Care

## 2022-11-29 VITALS — BP 164/100 | HR 113 | Temp 97.1°F | Ht 64.0 in | Wt 195.0 lb

## 2022-11-29 DIAGNOSIS — E781 Pure hyperglyceridemia: Secondary | ICD-10-CM

## 2022-11-29 DIAGNOSIS — F4323 Adjustment disorder with mixed anxiety and depressed mood: Secondary | ICD-10-CM | POA: Diagnosis not present

## 2022-11-29 DIAGNOSIS — I10 Essential (primary) hypertension: Secondary | ICD-10-CM

## 2022-11-29 DIAGNOSIS — E1165 Type 2 diabetes mellitus with hyperglycemia: Secondary | ICD-10-CM

## 2022-11-29 LAB — POCT GLYCOSYLATED HEMOGLOBIN (HGB A1C): Hemoglobin A1C: 12.2 % — AB (ref 4.0–5.6)

## 2022-11-29 MED ORDER — FLUOXETINE HCL 20 MG PO CAPS: 20.0000 mg | ORAL_CAPSULE | Freq: Every day | ORAL | 0 refills | Status: DC

## 2022-11-29 NOTE — Assessment & Plan Note (Signed)
Uncontrolled also inconsistent use of losartan. Blood pressure improved upon recheck.  I have asked that she start taking her losartan daily consistently.  Continue losartan 100 mg daily. Follow-up in 1 month.

## 2022-11-29 NOTE — Assessment & Plan Note (Signed)
She declines labs today, but she will come prepared next visit.  Unfortunately, the lipid clinic may be cost prohibitive. Will consult with pharmacy.  Continue atorvastatin 40 mg daily.

## 2022-11-29 NOTE — Progress Notes (Signed)
Subjective:    Patient ID: [name], female    DOB: 09-12-1981, 42 y.o.   MRN: [id number]  Depression        Associated symptoms include no headaches.   [name] is a very pleasant 42 y.o. female with a history of hypertension, type 2 diabetes, neuropathy, hypertriglyceridemia, anxiety depression who presents today for follow-up of chronic conditions and to discuss depression.  1) Essential Hypertension: Currently managed on losartan 100 mg daily for which she has been taking inconsistently. She will miss at least 2-3 days weekly as she forgets to take it. She does not check her BP at home, but she does have access to a BP cuff at work through the health clinic.   BP Readings from Last 3 Encounters:  11/29/22 (!) 164/100  08/09/22 138/86  06/30/22 138/76   She has a history of white coat syndrome. She denies headaches.    2) Type 2 Diabetes: Chronic, uncontrolled for years.   Current medications include: Lantus 50 units twice daily, NovoLog 28 units 3 times daily, metformin XR 1000 mg twice daily.  She is injecting Lantus 60 HS only. She is injecting 30 units of Novolog TID with meals.   She is checking her blood glucose 0 times daily as she was allergic to the glue from the [employer] and her insurance would not cover the Dexcom sensor.   She has been in conversation with a [employer] agent who is to be sending [place] some information about a free CGM.   Last A1C: 8.4 in August 2023, 12.2 today Last Eye Exam: Due Last Foot Exam: Up-to-date Pneumonia Vaccination: 2020 Urine Microalbumin: Up-to-date Statin: Atorvastatin  Dietary changes since last visit: Emotional eating with junk food and sweets.    Exercise: None  3) Anxiety/Depression: Chronic history. Symptoms include irritability, mood swings, feeling overwhelmed, tearfulness, worrying a lot. Symptoms occur daily, multiple times daily which is affecting her work day. Symptoms began in early 2023 and  have progressed since.   Previously prescribed fluoxetine, she doesn't recall ever starting this medication. She has a significant medical history of bipolar disorder in mother and brother. Her brother is managed on fluoxetine and does well. She does have access to therapy from her employer.   4) Hypertriglyceridemia: Currently managed on atorvastatin 40 mg daily.  Lipid panel in June 2023 with triglycerides of 3655 which was a decrease from 8699 1 month prior.  At this time she was referred to the lipid clinic for further management.   Since her last visit she has not visited the lipid clinic as she doesn't think that her insurance will cover. She is compliant to her atorvastatin as prescribed. She is not fasting today and is not ready for a lab draw. 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   Review of Systems  Respiratory:  Negative for shortness of breath.   Cardiovascular:  Negative for chest pain.  Neurological:  Positive for numbness. Negative for headaches.  Psychiatric/Behavioral:  Positive for depression and sleep disturbance. The patient is nervous/anxious.          Past Medical History:  Diagnosis Date   Diabetes mellitus    Hypercholesteremia    Hypertension    Pancreatitis    Pneumonia due to COVID-19 virus 09/02/2020    Social History   Socioeconomic History   Marital status: Single    Spouse name: Not on file   Number of children: Not on file  Years of education: Not on file   Highest education level: Not on file  Occupational History   Not on file  Tobacco Use   Smoking status: Former   Smokeless tobacco: Never  Substance and Sexual Activity   Alcohol use: No   Drug use: No   Sexual activity: Never  Other Topics Concern   Not on file  Social History Narrative   Not on file   Social Determinants of Health   Financial Resource Strain: Not on file  Food Insecurity: Not on file  Transportation Needs: Not on file  Physical  Activity: Not on file  Stress: Not on file  Social Connections: Not on file  Intimate Partner Violence: Not on file    Past Surgical History:  Procedure Laterality Date   KNEE SURGERY      Family History  Problem Relation Age of Onset   Cancer Other    Diabetes Other    Hypertension Other    Cancer Paternal Grandfather    Depression Mother    Diabetes Father    Hypertension Father    Depression Brother     No Known Allergies  Current Outpatient Medications on File Prior to Visit  Medication Sig Dispense Refill   atorvastatin (LIPITOR) 40 MG tablet Take 1 tablet (40 mg total) by mouth daily. for cholesterol. 90 tablet 0   insulin glargine (LANTUS) 100 UNIT/ML Solostar Pen Inject 50 Units into the skin 2 (two) times daily. 15 mL 5   insulin regular (NOVOLIN R) 100 units/mL injection Inject 0.28 mLs (28 Units total) into the skin 3 (three) times daily before meals. 10 mL 5   losartan (COZAAR) 100 MG tablet Take 1 tablet (100 mg total) by mouth daily. for blood pressure. 90 tablet 1   metFORMIN (GLUCOPHAGE-XR) 500 MG 24 hr tablet Take 2 tablets (1,000 mg total) by mouth 2 (two) times daily with a meal. for diabetes. 360 tablet 1   No current facility-administered medications on file prior to visit.    BP (!) 164/100   Pulse (!) 113   Temp (!) 97.1 F (36.2 C) (Temporal)   Ht 5\' 4"  (1.626 m)   Wt 195 lb (88.5 kg)   SpO2 98%   BMI 33.47 kg/m  Objective:   Physical Exam Cardiovascular:     Rate and Rhythm: Normal rate and regular rhythm.  Pulmonary:     Effort: Pulmonary effort is normal.     Breath sounds: Normal breath sounds.  Musculoskeletal:     Cervical back: Neck supple.  Skin:    General: Skin is warm and dry.     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   Neurological:     Mental Status: She is alert.  Psychiatric:     Comments: Tearful at times.           Assessment & Plan:   Problem List Items Addressed This Visit        Cardiovascular and Mediastinum   Essential hypertension    Uncontrolled also inconsistent use of losartan. Blood pressure improved upon recheck.  I have asked that she start taking her losartan daily consistently.  Continue losartan 100 mg daily. Follow-up in 1 month.        Endocrine   Type 2 diabetes mellitus with hyperglycemia (HCC)    Uncontrolled with A1c of 12.2 today which is largely secondary to her poor diet from depression.  We will work to gain better control of her depression.  Dexcom G6 samples provided today so that she can start monitoring glucose levels.  Continue Lantus 60 units at bedtime. Continue NovoLog 30 units 3 times daily with meals. Continue metformin XR 1000 mg twice daily.  Unfortunately, she has a history of recurrent pancreatitis so GLP-1 agents are not an option.  Consider resuming glipizide. Will consult with pharmacy for options.  [name] could be an option, but she needs to start checking glucose levels regularly first. She will work on her diet.  Follow-up in 1 month.      Relevant Orders   POCT glycosylated hemoglobin (Hb A1C) (Completed)     Other   Hypertriglyceridemia    She declines labs today, but she will come prepared next visit.  Unfortunately, the lipid clinic may be cost prohibitive. Will consult with pharmacy.  Continue atorvastatin 40 mg daily.      Adjustment disorder with mixed anxiety and depressed mood - Primary    Uncontrolled.  Discussed options for treatment, she opts for medication.  She will get in touch with her employers therapy group.  Start fluoxetine 20 mg daily. Follow-up in 1 month.      Relevant Medications   FLUoxetine (PROZAC) 20 MG capsule       Pleas Koch, NP

## 2022-11-29 NOTE — Assessment & Plan Note (Signed)
Uncontrolled with A1c of 12.2 today which is largely secondary to her poor diet from depression.  We will work to gain better control of her depression. Dexcom G6 samples provided today so that she can start monitoring glucose levels.  Continue Lantus 60 units at bedtime. Continue NovoLog 30 units 3 times daily with meals. Continue metformin XR 1000 mg twice daily.  Unfortunately, she has a history of recurrent pancreatitis so GLP-1 agents are not an option.  Consider resuming glipizide. Will consult with pharmacy for options.  [name] could be an option, but she needs to start checking glucose levels regularly first. She will work on her diet.  Follow-up in 1 month.

## 2022-11-29 NOTE — Assessment & Plan Note (Signed)
Uncontrolled.  Discussed options for treatment, she opts for medication.  She will get in touch with her employers therapy group.  Start fluoxetine 20 mg daily. Follow-up in 1 month.

## 2022-11-29 NOTE — Patient Instructions (Signed)
Start monitoring your blood pressure daily, around the same time of day, for the next 2-3 weeks.  Ensure that you have rested for 30 minutes prior to checking your blood pressure.   Record your readings and notify me if you see numbers consistently at or above 130 on top and/or 90 on bottom.  Start the G6 Dexcom glucose monitor.   Start fluoxetine 20 mg daily for anxiety and depression.   Schedule a follow up visit in 1 month.  It was a pleasure to see you today!

## 2022-12-08 ENCOUNTER — Ambulatory Visit: Payer: No Typology Code available for payment source | Admitting: Occupational Medicine

## 2022-12-08 DIAGNOSIS — S50819A Abrasion of unspecified forearm, initial encounter: Secondary | ICD-10-CM

## 2022-12-08 NOTE — Progress Notes (Signed)
Patient reports cut right forearm on metal cut. Hx of Dm. Last Tdap 8/23. Noted small abrasion cleaned with antiseptic, applied neosporin and Telfa. Educated to keep clean and dry. Education to patient to monitor for signs of infection fever redness and swelling. To notify clinic ASAP for infection.  Educated to fill out incident report as well.

## 2022-12-30 ENCOUNTER — Ambulatory Visit: Payer: No Typology Code available for payment source | Admitting: Primary Care

## 2023-01-13 ENCOUNTER — Encounter: Payer: Self-pay | Admitting: Primary Care

## 2023-01-13 ENCOUNTER — Ambulatory Visit: Payer: No Typology Code available for payment source | Admitting: Primary Care

## 2023-01-13 VITALS — BP 152/90 | HR 95 | Temp 97.3°F | Ht 64.0 in | Wt 196.0 lb

## 2023-01-13 DIAGNOSIS — E1165 Type 2 diabetes mellitus with hyperglycemia: Secondary | ICD-10-CM

## 2023-01-13 DIAGNOSIS — F4323 Adjustment disorder with mixed anxiety and depressed mood: Secondary | ICD-10-CM

## 2023-01-13 DIAGNOSIS — I10 Essential (primary) hypertension: Secondary | ICD-10-CM | POA: Diagnosis not present

## 2023-01-13 DIAGNOSIS — E781 Pure hyperglyceridemia: Secondary | ICD-10-CM

## 2023-01-13 MED ORDER — TRULICITY 0.75 MG/0.5ML ~~LOC~~ SOAJ: 0.7500 mg | SUBCUTANEOUS | 0 refills | Status: DC

## 2023-01-13 MED ORDER — FLUOXETINE HCL 40 MG PO CAPS: 40.0000 mg | ORAL_CAPSULE | Freq: Every day | ORAL | 0 refills | Status: DC

## 2023-01-13 MED ORDER — LOSARTAN POTASSIUM-HCTZ 100-12.5 MG PO TABS: 1.0000 | ORAL_TABLET | Freq: Every day | ORAL | 0 refills | Status: DC

## 2023-01-13 NOTE — Assessment & Plan Note (Signed)
Uncontrolled, even on recheck.  Stop losartan 100 mg daily. Start losartan hydrochlorothiazide 100-12.5 mg daily.  BMP pending.  Follow-up in 1 month.

## 2023-01-13 NOTE — Patient Instructions (Signed)
Stop by the lab prior to leaving today. I will notify you of your results once received.   Start dulaglutide (Trulicity) [id number] mg once weekly for diabetes.  Continue Lantus 60 units daily.  Be sure to inject NovoLog 30 units 3 times daily with meals.  Start checking her blood sugars as discussed.  Stop taking losartan 100 mg daily for blood pressure. Start taking losartan-hydrochlorothiazide 100-12.5 mg daily for blood pressure.  We increased your fluoxetine (Prozac) for anxiety/depression to 40 mg daily.  Schedule follow-up visit for 1 month.  It was a pleasure to see you today!

## 2023-01-13 NOTE — Progress Notes (Signed)
Subjective:    Patient ID: [name], female    DOB: June 03, 1981, 42 y.o.   MRN: [id number]  HPI  [name] is a very pleasant 42 y.o. female with a history of type 2 diabetes, hypertension, hypertriglyceridemia who presents today for follow up of diabetes, hypertension, anxiety/depression, and hypertriglyceridemia.   1) Current medications include: Lantus 60 units HS, Novolog 30 units TID with meals, metformin XR 1000 mg BID.  She is actually injecting 20 units of Novolog three times daily.   She is checking her blood glucose 0 times daily. She is not using her CGM as "there's no point since I'm binge eating". She did experience an allergic reaction with the adhesive of FreeStyle Libre.  Her insurance will not cover Dexcom or Medtronics.  Last A1C: 12.2 in January 2024 Last Eye Exam: UTD Last Foot Exam: UTD Pneumonia Vaccination: 2020 Urine Microalbumin: UTD Statin: atorvastatin   Dietary changes since last visit: None. She does emotional binge eating when feeling sad, this occurs 2-3 days weekly.    Exercise: None.   2) Hypertension: Currently managed on losartan 100 mg daily.  During her last office visit 1 month ago her blood pressure was above goal, but at that time she was not taking losartan consistently.  Since her last visit she is compliant to her losartan daily. She denies headaches and dizziness.  BP Readings from Last 3 Encounters:  01/13/23 (!) 152/90  11/29/22 (!) 164/100  08/09/22 138/86   3) Anxiety/Depression: Currently managed on fluoxetine 20 mg daily for symptoms of irritability, mood swings, tearfulness, excessive worry.  This regimen was initiated last month.  Last month she also mentioned that she would get in touch with her employers therapy program.  Since her last visit she's feeling some better. She has not feeling as overwhelmed but she continues with her same symptoms from last visit.  She denies any side effects or complications from  fluoxetine.  4) Hypertriglyceridemia: Currently managed on atorvastatin 40 mg daily.  Referred to the lipid clinic in June 2023 for excessively high levels of triglycerides despite management on statin therapy.  During her visit last months she mentioned she had not been to the lipid clinic as insurance would not cover.  She is due for repeat lipid panel today.    Review of Systems  Respiratory:  Negative for shortness of breath.   Cardiovascular:  Negative for chest pain.  Neurological:  Negative for dizziness and headaches.  Psychiatric/Behavioral:  The patient is nervous/anxious.        See HPI         Past Medical History:  Diagnosis Date   Diabetes mellitus    Hypercholesteremia    Hypertension    Pancreatitis    Pneumonia due to COVID-19 virus 09/02/2020    Social History   Socioeconomic History   Marital status: Single    Spouse name: Not on file   Number of children: Not on file   Years of education: Not on file   Highest education level: Not on file  Occupational History   Not on file  Tobacco Use   Smoking status: Former   Smokeless tobacco: Never  Substance and Sexual Activity   Alcohol use: No   Drug use: No   Sexual activity: Never  Other Topics Concern   Not on file  Social History Narrative   Not on file   Social Determinants of Health   Financial Resource Strain: Not on file  Food Insecurity: Not on file  Transportation Needs: Not on file  Physical Activity: Not on file  Stress: Not on file  Social Connections: Not on file  Intimate Partner Violence: Not on file    Past Surgical History:  Procedure Laterality Date   KNEE SURGERY      Family History  Problem Relation Age of Onset   Cancer Other    Diabetes Other    Hypertension Other    Cancer Paternal Grandfather    Depression Mother    Diabetes Father    Hypertension Father    Depression Brother     No Known Allergies  Current Outpatient Medications on File Prior to Visit   Medication Sig Dispense Refill   atorvastatin (LIPITOR) 40 MG tablet Take 1 tablet (40 mg total) by mouth daily. for cholesterol. 90 tablet 0   insulin glargine (LANTUS) 100 UNIT/ML Solostar Pen Inject 50 Units into the skin 2 (two) times daily. 15 mL 5   insulin regular (NOVOLIN R) 100 units/mL injection Inject 0.28 mLs (28 Units total) into the skin 3 (three) times daily before meals. 10 mL 5   metFORMIN (GLUCOPHAGE-XR) 500 MG 24 hr tablet Take 2 tablets (1,000 mg total) by mouth 2 (two) times daily with a meal. for diabetes. 360 tablet 1   No current facility-administered medications on file prior to visit.    BP (!) 152/90   Pulse 95   Temp (!) 97.3 F (36.3 C) (Temporal)   Ht 5' 4"$  (1.626 m)   Wt 196 lb (88.9 kg)   SpO2 98%   BMI 33.64 kg/m  Objective:   Physical Exam Cardiovascular:     Rate and Rhythm: Normal rate and regular rhythm.  Pulmonary:     Effort: Pulmonary effort is normal.     Breath sounds: Normal breath sounds.  Musculoskeletal:     Cervical back: Neck supple.  Skin:    General: Skin is warm and dry.           Assessment & Plan:  Adjustment disorder with mixed anxiety and depressed mood -     FLUoxetine HCl; Take 1 capsule (40 mg total) by mouth daily. for anxiety and depression.  Dispense: 90 capsule; Refill: 0  Type 2 diabetes mellitus with hyperglycemia, without long-term current use of insulin ([place]) Assessment & Plan: Likely uncontrolled.  Long discussion today about the absolute need to check glucose levels.  Continue Lantus 60 units daily. Discussed that she should be injecting NovoLog 30 units 3 times daily with meals.  Continue metformin XR 1000 mg twice daily.  Add Trulicity [id number] mg weekly.  Although she has a history of pancreatitis, the risks for pancreatitis do not outweigh the benefits of better diabetes control and binge eating control.  I am hopeful that a GLP-1 agonist will help to reduce binge eating and cravings.  Will plan  to see her back in 1 month for follow-up.  Orders: -     Trulicity; Inject 0.75 mg into the skin once a week. for diabetes.  Dispense: 6 mL; Refill: 0  Essential hypertension Assessment & Plan: Uncontrolled, even on recheck.  Stop losartan 100 mg daily. Start losartan hydrochlorothiazide 100-12.5 mg daily.  BMP pending.  Follow-up in 1 month.  Orders: -     Losartan Potassium-HCTZ; Take 1 tablet by mouth daily. for blood pressure.  Dispense: 90 tablet; Refill: 0 -     Basic metabolic panel  Hypertriglyceridemia Assessment & Plan: Repeat lipid panel pending.  Continue atorvastatin 40 mg daily. Consider addition of fenofibrate 145 mg daily.  Orders: -     Lipid panel        Pleas Koch, NP

## 2023-01-13 NOTE — Assessment & Plan Note (Signed)
Likely uncontrolled.  Long discussion today about the absolute need to check glucose levels.  Continue Lantus 60 units daily. Discussed that she should be injecting NovoLog 30 units 3 times daily with meals.  Continue metformin XR 1000 mg twice daily.  Add Trulicity [id number] mg weekly.  Although she has a history of pancreatitis, the risks for pancreatitis do not outweigh the benefits of better diabetes control and binge eating control.  I am hopeful that a GLP-1 agonist will help to reduce binge eating and cravings.  Will plan to see her back in 1 month for follow-up.

## 2023-01-13 NOTE — Assessment & Plan Note (Signed)
Repeat lipid panel pending.  Continue atorvastatin 40 mg daily. Consider addition of fenofibrate 145 mg daily.

## 2023-01-14 LAB — BASIC METABOLIC PANEL
BUN: 15 mg/dL (ref 7–25)
CO2: 15 mmol/L — ABNORMAL LOW (ref 20–32)
Calcium: 9.4 mg/dL (ref 8.6–10.2)
Chloride: 103 mmol/L (ref 98–110)
Creat: 0.52 mg/dL (ref 0.50–0.99)
Glucose, Bld: 316 mg/dL — ABNORMAL HIGH (ref 65–99)
Potassium: 3.9 mmol/L (ref 3.5–5.3)
Sodium: 139 mmol/L (ref 135–146)

## 2023-01-14 LAB — LIPID PANEL
Cholesterol: 875 mg/dL — ABNORMAL HIGH (ref <200)
HDL: 28 mg/dL — ABNORMAL LOW (ref >=50)
Non-HDL Cholesterol (Calc): 847 mg/dL (calc) — ABNORMAL HIGH (ref <130)
Total CHOL/HDL Ratio: 31.3 (calc) — ABNORMAL HIGH (ref <5.0)
Triglycerides: >10000 mg/dL — ABNORMAL HIGH (ref <150)

## 2023-01-18 ENCOUNTER — Other Ambulatory Visit: Payer: Self-pay | Admitting: Primary Care

## 2023-01-18 DIAGNOSIS — E781 Pure hyperglyceridemia: Secondary | ICD-10-CM

## 2023-01-18 MED ORDER — FENOFIBRATE 145 MG PO TABS: 145.0000 mg | ORAL_TABLET | Freq: Every day | ORAL | 0 refills | Status: DC

## 2023-01-19 ENCOUNTER — Telehealth: Payer: Self-pay | Admitting: Registered Nurse

## 2023-01-19 ENCOUNTER — Encounter: Payer: Self-pay | Admitting: Registered Nurse

## 2023-01-19 DIAGNOSIS — E781 Pure hyperglyceridemia: Secondary | ICD-10-CM

## 2023-01-19 DIAGNOSIS — E1165 Type 2 diabetes mellitus with hyperglycemia: Secondary | ICD-10-CM

## 2023-01-19 DIAGNOSIS — I10 Essential (primary) hypertension: Secondary | ICD-10-CM

## 2023-01-19 DIAGNOSIS — Z0000 Encounter for general adult medical examination without abnormal findings: Secondary | ICD-10-CM

## 2023-01-19 NOTE — Telephone Encounter (Signed)
Patient had PCM appt 01/13/23 for annual physical.  Had stopped all meds (hypertension, diabetes, triglycerides) due to depression.  Restarted medications with PCM.  Vapes given tobacco cessation information/brainshark video and alternative form for hypertension/diabetes/hypertriglceridemia for PCM to sign at next appt.  Patient to bring completed tobacco cessation quiz and alternatives form after PCM completion back to clinic staff within 60 days/prior to 29 May 2023  Labs completed at [facility] appt.  BP, lipids and A1c did not meet requirements for 2025 Be Well.  Restart all medications per PCM.  Drink water to keep urine pale yellow clear and voiding every 2-4 hours while awake.  Recheck lipids and Hgba1c/BP in 3 months.  See RN Evlyn Kanner next week for BP recheck.   I recommend exercise 150 minutes per week; dietary fiber daily by mouth 20 grams women per up to date; eat whole grains/fruits/vegetables; keep added sugars to less than 100 calories/ 5 teaspoons for women per American Heart Association    Be well insurance premium discount evaluation:    Patient is smoker completed tobacco cessation alteratives.    epic reviewed by NP and transcribed Labs  Tobacco attestation signed. [employer] ROI formed signed. Forms placed in the paper chart at [facility]    Patient given handouts for [facility] pharmacies and discount drugs list,MyChart setup  Tele doc setup, Tele doc Behavioral, [place] counseling and [facility] counseling.        Latest Reference Range & Units 04/27/22 12:08 04/27/22 12:38 04/27/22 15:16 04/29/22 15:48 05/27/22 16:10 05/27/22 17:04 06/30/22 14:57 11/29/22 15:07 01/13/23 15:07  COMPREHENSIVE METABOLIC PANEL     Rpt !       Sodium 135 - 146 mmol/L    139  133 (L)   139  Potassium 3.5 - 5.3 mmol/L    4.2  4.0   3.9  Chloride 98 - 110 mmol/L    102  100   103  CO2 20 - 32 mmol/L    20  20   15 $ (L)  Glucose 65 - 99 mg/dL    306 (H)  221 (H)   316 (H)  BUN 7 - 25 mg/dL     14  18   15  $ Creatinine 0.50 - 0.99 mg/dL    0.55  0.94   0.52  Calcium 8.6 - 10.2 mg/dL    9.3  8.9   9.4  BUN/Creatinine Ratio 6 - 22 (calc)    NOT APPLICABLE  NOT APPLICABLE   SEE NOTE:  AG Ratio 1.0 - 2.5 (calc)    1.3       AST 10 - 30 U/L    10       ALT 6 - 29 U/L    9       Total Protein 6.1 - 8.1 g/dL    7.1       Total Bilirubin 0.2 - 1.2 mg/dL    0.4       Total CHOL/HDL Ratio <5.0 (calc)    36.1 (H)  21.8 (H)   31.3 (H)  Cholesterol <200 mg/dL    902 (H)  522 (H)   875 (H)  HDL Cholesterol > OR = 50 mg/dL    25 (L)  24 (L)   28 (L)  LDL Cholesterol (Calc) mg/dL (calc)    Pend  Pend   Pend  MICROALB/CREAT RATIO 0.0 - 30.0 mg/g  151.7 (H)         Non-HDL Cholesterol (Calc) <130  mg/dL (calc)    877 (H)  498 (H)   847 (H)  Triglycerides <150 mg/dL    8,699 (H)  3,655 (H)   >10,000 (H)  Alkaline phosphatase (APISO) 31 - 125 U/L    65       Globulin 1.9 - 3.7 g/dL (calc)    3.1       WBC 3.8 - 10.8 Thousand/uL   8.2        RBC 3.80 - 5.10 Million/uL   4.10        Hemoglobin 11.7 - 15.5 g/dL   13.0        HCT 35.0 - 45.0 %   36.0        MCV 80.0 - 100.0 fL   87.8        MCH 27.0 - 33.0 pg   31.7        MCHC 32.0 - 36.0 g/dL   36.1 (H)        RDW 11.0 - 15.0 %   14.8        Platelets 140 - 400 Thousand/uL   460 (H)        MPV 7.5 - 12.5 fL   9.4        Hemoglobin A1C 4.0 - 5.6 % 10.8 !      8.4 ! 12.2 !   Glutamic Acid Decarb Ab <5 IU/mL     <5      C-Peptide 0.80 - 3.85 ng/mL     4.17 (H)      !: Data is abnormal (L): Data is abnormally low (H): Data is abnormally high Rpt: View report in Results Review for more information

## 2023-02-15 ENCOUNTER — Ambulatory Visit: Payer: No Typology Code available for payment source | Admitting: Primary Care

## 2023-03-07 ENCOUNTER — Encounter: Payer: Self-pay | Admitting: Primary Care

## 2023-03-07 ENCOUNTER — Ambulatory Visit: Payer: No Typology Code available for payment source | Admitting: Primary Care

## 2023-03-07 VITALS — BP 154/82 | HR 88 | Temp 98.3°F | Ht 64.0 in | Wt 199.0 lb

## 2023-03-07 DIAGNOSIS — E1165 Type 2 diabetes mellitus with hyperglycemia: Secondary | ICD-10-CM

## 2023-03-07 DIAGNOSIS — I10 Essential (primary) hypertension: Secondary | ICD-10-CM

## 2023-03-07 DIAGNOSIS — E781 Pure hyperglyceridemia: Secondary | ICD-10-CM | POA: Diagnosis not present

## 2023-03-07 DIAGNOSIS — F4323 Adjustment disorder with mixed anxiety and depressed mood: Secondary | ICD-10-CM | POA: Diagnosis not present

## 2023-03-07 LAB — POCT GLYCOSYLATED HEMOGLOBIN (HGB A1C): Hemoglobin A1C: 11 % — AB (ref 4.0–5.6)

## 2023-03-07 MED ORDER — GEMFIBROZIL 600 MG PO TABS: 600.0000 mg | ORAL_TABLET | Freq: Two times a day (BID) | ORAL | 0 refills | Status: DC

## 2023-03-07 MED ORDER — LISINOPRIL-HYDROCHLOROTHIAZIDE 20-25 MG PO TABS
1.0000 | ORAL_TABLET | Freq: Every day | ORAL | 0 refills | Status: AC
Start: 2023-03-07 — End: ?

## 2023-03-07 NOTE — Assessment & Plan Note (Signed)
Improved but remains uncontrolled with A1C today of 11.1.  Will work to get PA approved for [employer]. Recommend 0.75 mg weekly x 1 month, then increase to 1.5 mg weekly thereafter.  Continue metformin ER 1000 mg BID, Lantus 50 units BID, Novolin R 28 units TID with meals.  Follow up in 1 month.

## 2023-03-07 NOTE — Assessment & Plan Note (Signed)
Significant improvement!  Continue Prozac 40 mg daily.

## 2023-03-07 NOTE — Progress Notes (Signed)
Subjective:    Patient ID: [name], female    DOB: 06/30/1981, 42 y.o.   MRN: [id number]  HPI  [name] is a very pleasant 42 y.o. female with a history of hypertension, hyperlipidemia, type 2 diabetes, pancreatitis, anxiety and depression who presents today for follow up.    1) Essential Hypertension: Currently managed on losartan-HCTZ 100-12.5 mg daily. During her last visit HCTZ 12.5 mg was added. She is compliant daily. She does not check her BP at home or work. She denies chest pain, headaches, dizziness.   She can get lisinopril-HCTZ free from her employer.    BP Readings from Last 3 Encounters:  03/07/23 (!) 154/82  01/13/23 (!) 152/90  11/29/22 (!) 164/100   2) Type 2 Diabetes:  Current medications include: Lantus 50 units BID, Novlin R 28 units three times daily, Trulicity 0.75 mg weekly, metformin XR 1000 mg BID.  She never began the Trulicity as her insurance required a PA. The PA was never completed.   She is checking her blood glucose continuously and is getting an average reading of mid 200's over the last 30 days.   Last A1C: 12.2 in January 2024, 11.0 today  Last Eye Exam: Due Last Foot Exam: UTD Pneumonia Vaccination: 2020 Urine Microalbumin: UTD Statin: atorvastatin   Dietary changes since last visit: She is cutting back on bread. Still eating frozen food and fast food. Also eats pasta.    Exercise: Walking recently.   3) Hypertriglyceridemia: Currently managed on atorvastatin 40 mg daily and fenofibrate 145 mg daily. During her last visit her triglycerides were >10,000. She agreed to the referral to the lipid clinic this time, referral was placed in February 2024.  She has an appointment scheduled with the lipid clinic in August 2024. She can get gemfibrozil 600 mg free from her employer and would like to switch from fenofibrate.    Review of Systems  Respiratory:  Negative for shortness of breath.   Cardiovascular:  Negative for chest  pain.  Neurological:  Positive for numbness. Negative for headaches.  Psychiatric/Behavioral:  The patient is not nervous/anxious.          Past Medical History:  Diagnosis Date   Diabetes mellitus    Hypercholesteremia    Hypertension    Pancreatitis    Pneumonia due to COVID-19 virus 09/02/2020    Social History   Socioeconomic History   Marital status: Single    Spouse name: Not on file   Number of children: Not on file   Years of education: Not on file   Highest education level: Not on file  Occupational History   Not on file  Tobacco Use   Smoking status: Former   Smokeless tobacco: Never  Substance and Sexual Activity   Alcohol use: No   Drug use: No   Sexual activity: Never  Other Topics Concern   Not on file  Social History Narrative   Not on file   Social Determinants of Health   Financial Resource Strain: Not on file  Food Insecurity: Not on file  Transportation Needs: Not on file  Physical Activity: Not on file  Stress: Not on file  Social Connections: Not on file  Intimate Partner Violence: Not on file    Past Surgical History:  Procedure Laterality Date   KNEE SURGERY      Family History  Problem Relation Age of Onset   Cancer Other    Diabetes Other    Hypertension Other  Cancer Paternal Grandfather    Depression Mother    Diabetes Father    Hypertension Father    Depression Brother     No Known Allergies  Current Outpatient Medications on File Prior to Visit  Medication Sig Dispense Refill   atorvastatin (LIPITOR) 40 MG tablet Take 1 tablet (40 mg total) by mouth daily. for cholesterol. 90 tablet 0   Dulaglutide (TRULICITY) 0.75 MG/0.5ML SOPN Inject 0.75 mg into the skin once a week. for diabetes. 6 mL 0   FLUoxetine (PROZAC) 40 MG capsule Take 1 capsule (40 mg total) by mouth daily. for anxiety and depression. 90 capsule 0   insulin glargine (LANTUS) 100 UNIT/ML Solostar Pen Inject 50 Units into the skin 2 (two) times daily.  15 mL 5   insulin regular (NOVOLIN R) 100 units/mL injection Inject 0.28 mLs (28 Units total) into the skin 3 (three) times daily before meals. 10 mL 5   metFORMIN (GLUCOPHAGE-XR) 500 MG 24 hr tablet Take 2 tablets (1,000 mg total) by mouth 2 (two) times daily with a meal. for diabetes. 360 tablet 1   No current facility-administered medications on file prior to visit.    BP (!) 154/82   Pulse 88   Temp 98.3 F (36.8 C) (Temporal)   Ht 5\' 4"  (1.626 m)   Wt 199 lb (90.3 kg)   LMP 02/21/2023 (Approximate)   SpO2 100%   BMI 34.16 kg/m  Objective:   Physical Exam Cardiovascular:     Rate and Rhythm: Normal rate and regular rhythm.  Pulmonary:     Effort: Pulmonary effort is normal.     Breath sounds: Normal breath sounds.  Musculoskeletal:     Cervical back: Neck supple.  Skin:    General: Skin is warm and dry.           Assessment & Plan:  Type 2 diabetes mellitus with hyperglycemia, without long-term current use of insulin Assessment & Plan: Improved but remains uncontrolled with A1C today of 11.1.  Will work to get PA approved for [employer]. Recommend 0.75 mg weekly x 1 month, then increase to 1.5 mg weekly thereafter.  Continue metformin ER 1000 mg BID, Lantus 50 units BID, Novolin R 28 units TID with meals.  Follow up in 1 month.  Orders: -     POCT glycosylated hemoglobin (Hb A1C)  Hypertriglyceridemia Assessment & Plan: Exam today suggestive of improved triglycerides.  Stop fenofibrate due to cost. Start gemfibrozil 600 mg BID as this is free from her employer.  Continue atorvastatin 40 mg daily.  Repeat lipids pending.  Follow up with lipid clinic as scheduled.   Orders: -     Gemfibrozil; Take 1 tablet (600 mg total) by mouth 2 (two) times daily before a meal. For cholesterol.  Dispense: 180 tablet; Refill: 0 -     Lipid panel  Essential hypertension Assessment & Plan: Uncontrolled.  Stop losartan-HCTZ 100-12.5 mg daily. Start lisinopril-HCTZ  20-25 mg daily.  BMP pending. Follow up in 1 month.  Orders: -     Lisinopril-hydroCHLOROthiazide; Take 1 tablet by mouth daily. for blood pressure.  Dispense: 90 tablet; Refill: 0 -     Comprehensive metabolic panel  Adjustment disorder with mixed anxiety and depressed mood Assessment & Plan: Significant improvement!  Continue Prozac 40 mg daily.          Doreene Nest, NP

## 2023-03-07 NOTE — Assessment & Plan Note (Signed)
Exam today suggestive of improved triglycerides.  Stop fenofibrate due to cost. Start gemfibrozil 600 mg BID as this is free from her employer.  Continue atorvastatin 40 mg daily.  Repeat lipids pending.  Follow up with lipid clinic as scheduled.

## 2023-03-07 NOTE — Patient Instructions (Addendum)
Stop taking losartan-hydrochlorothiazide 100-12.5 mg. Start taking lisinopril-hydrochlorothiazide 20-25 mg daily for blood pressure.  Stop taking fenofibrate 145 mg for cholesterol. Start taking gemfibrozil 600 mg twice daily for cholesterol.  Continue all other medications.   Please call or message me if you don't hear from anyone by next Tuesday regarding the Trulicity.  Work on [employer]!  Set up a visit for 1 month.  It was a pleasure to see you today!

## 2023-03-07 NOTE — Assessment & Plan Note (Signed)
Uncontrolled.  Stop losartan-HCTZ 100-12.5 mg daily. Start lisinopril-HCTZ 20-25 mg daily.  BMP pending. Follow up in 1 month.

## 2023-03-08 LAB — COMPREHENSIVE METABOLIC PANEL
ALT: 12 U/L (ref 0–35)
AST: 10 U/L (ref 0–37)
Albumin: 4.5 g/dL (ref 3.5–5.2)
Alkaline Phosphatase: 75 U/L (ref 39–117)
BUN: 22 mg/dL (ref 6–23)
CO2: 25 mEq/L (ref 19–32)
Calcium: 10.1 mg/dL (ref 8.4–10.5)
Chloride: 99 mEq/L (ref 96–112)
Creatinine, Ser: 0.69 mg/dL (ref 0.40–1.20)
GFR: 106.48 mL/min (ref >60.00)
Glucose, Bld: 128 mg/dL — ABNORMAL HIGH (ref 70–99)
Potassium: 3.9 mEq/L (ref 3.5–5.1)
Sodium: 136 mEq/L (ref 135–145)
Total Bilirubin: 0.4 mg/dL (ref 0.2–1.2)
Total Protein: 7.4 g/dL (ref 6.0–8.3)

## 2023-03-08 LAB — LIPID PANEL
Cholesterol: 293 mg/dL — ABNORMAL HIGH (ref 0–200)
HDL: 39.7 mg/dL (ref >39.00)
Total CHOL/HDL Ratio: 7
Triglycerides: 1833 mg/dL — ABNORMAL HIGH (ref 0.0–149.0)

## 2023-03-08 LAB — LDL CHOLESTEROL, DIRECT: Direct LDL: 74 mg/dL

## 2023-03-10 ENCOUNTER — Telehealth: Payer: Self-pay

## 2023-03-10 NOTE — Telephone Encounter (Signed)
Please submit PA for Trulicity 0/75mg  pen injectors for this patient.  Thank you

## 2023-03-11 DIAGNOSIS — E1165 Type 2 diabetes mellitus with hyperglycemia: Secondary | ICD-10-CM

## 2023-03-13 ENCOUNTER — Other Ambulatory Visit (HOSPITAL_COMMUNITY): Payer: Self-pay

## 2023-03-13 MED ORDER — TRULICITY 0.75 MG/0.5ML ~~LOC~~ SOAJ
0.7500 mg | SUBCUTANEOUS | 0 refills | Status: DC
Start: 2023-03-13 — End: 2023-04-20

## 2023-03-14 ENCOUNTER — Telehealth: Payer: Self-pay | Admitting: Registered Nurse

## 2023-03-14 ENCOUNTER — Encounter: Payer: Self-pay | Admitting: Registered Nurse

## 2023-03-14 ENCOUNTER — Ambulatory Visit: Payer: No Typology Code available for payment source | Admitting: Occupational Medicine

## 2023-03-14 DIAGNOSIS — E1165 Type 2 diabetes mellitus with hyperglycemia: Secondary | ICD-10-CM

## 2023-03-14 DIAGNOSIS — I10 Essential (primary) hypertension: Secondary | ICD-10-CM

## 2023-03-14 DIAGNOSIS — E781 Pure hyperglyceridemia: Secondary | ICD-10-CM

## 2023-03-14 DIAGNOSIS — E559 Vitamin D deficiency, unspecified: Secondary | ICD-10-CM

## 2023-03-14 DIAGNOSIS — Z0000 Encounter for general adult medical examination without abnormal findings: Secondary | ICD-10-CM

## 2023-03-14 MED ORDER — ATORVASTATIN CALCIUM 40 MG PO TABS: 40.0000 mg | ORAL_TABLET | Freq: Every day | ORAL | 3 refills | Status: AC

## 2023-03-14 MED ORDER — GEMFIBROZIL 600 MG PO TABS: 600.0000 mg | ORAL_TABLET | Freq: Two times a day (BID) | ORAL | 3 refills | Status: DC

## 2023-03-14 MED ORDER — LISINOPRIL-HYDROCHLOROTHIAZIDE 20-25 MG PO TABS: 1.0000 | ORAL_TABLET | Freq: Every day | ORAL | 3 refills | Status: AC

## 2023-03-14 NOTE — Progress Notes (Signed)
Be well insurance premium discount evaluation: Reasonable accomodation applied signed by her PCP. Improve diet, medication changes, increase exercise started a new gym. Follow with PCP in a month. A1c recheck in 3 months.   Patient is smoker tobacco cessation alteratives completed. Brain shark video and quiz. Reviewed answers  Patient completed PCM office visit epic reviewed by RN Kimrey and transcribed Labs. Tobacco attestation signed. [employer] ROI formed signed. Forms placed in the chart.   Patient given handouts for [facility] pharmacies and discount drugs list,MyChart, Tele doc setup, Tele doc [address], [place] counseling and [employer] counseling.  What to do for infectious illness protocol. Given handout for list of medications that can be filled at [facility]. Given Clinic hours and Clinic Email.

## 2023-03-14 NOTE — Telephone Encounter (Signed)
Patient had been filling Rx at [facility].  Last PDRx fill atorvastatin Jun 2023.  Had labs with [facility]   Latest Reference Range & Units 04/29/22 15:48 05/27/22 16:10 05/27/22 17:04 06/30/22 14:57 11/29/22 15:07 01/13/23 15:07 03/07/23 14:25 03/07/23 14:53  BASIC METABOLIC PANEL    Rpt !   Rpt !    COMPREHENSIVE METABOLIC PANEL  Rpt !       Rpt !  Sodium 135 - 145 mEq/L 139  133 (L)   139  136  Potassium 3.5 - 5.1 mEq/L 4.2  4.0   3.9  3.9  Chloride 96 - 112 mEq/L 102  100   103  99  CO2 19 - 32 mEq/L (L)  25  Glucose 70 - 99 mg/dL 161 (H)  096 (H)   045 (H)  128 (H)  BUN 6 - 23 mg/dL Creatinine 0.40 - 1.20 mg/dL 4.09  8.11   9.14  7.82  Calcium 8.4 - 10.5 mg/dL 9.3  8.9   9.4  95.6  BUN/Creatinine Ratio 6 - 22 (calc) NOT APPLICABLE  NOT APPLICABLE   SEE NOTE:    Alkaline Phosphatase 39 - 117 U/L        75  Albumin 3.5 - 5.2 g/dL        4.5  AG Ratio 1.0 - 2.5 (calc) 1.3         AST 0 - 37 U/L 10       10  ALT 0 - 35 U/L 9       12  Total Protein 6.0 - 8.3 g/dL 7.1       7.4  Total Bilirubin 0.2 - 1.2 mg/dL 0.4       0.4  GFR >21.30 mL/min        106.48  Total CHOL/HDL Ratio <5.0 (calc) 36.1 (H)  21.8 (H)   31.3 (H)  7  Cholesterol 0 - 200 mg/dL 865 (H)  784 (H)   696 (H)  293 (H)  HDL Cholesterol >39.00 mg/dL 25 (L)  24 (L)   28 (L)  39.70  Direct LDL mg/dL        29.5  LDL Cholesterol (Calc) mg/dL (calc) Pend  Pend   Pend    Non-HDL Cholesterol (Calc) <130 mg/dL (calc) 284 (H)  132 (H)   847 (H)    Triglycerides 0.0 - 149.0 mg/dL 4,401 (H)  0,272 (H)   >10,000 (H)  1833.0 Triglyceride is over 400; calculations on Lipids are invalid. (H)  Alkaline phosphatase (APISO) 31 - 125 U/L 65         Globulin 1.9 - 3.7 g/dL (calc) 3.1         Hemoglobin A1C 4.0 - 5.6 %    8.4 ! 12.2 !  11.0 !   Glutamic Acid Decarb Ab <5 IU/mL  <5        C-Peptide 0.80 - 3.85 ng/mL  4.17 (H)        !: Data is abnormal (L): Data is abnormally low (H): Data is abnormally high Rpt:  View report in Results Review for [facility] informationBu[facility]il on backorder only able to dispense 120 tablets at this time.  Last BP   BP 154/82 Important    Pulse 88   Temp 98.3 F (36.8 C) (Temporal)   Ht  (1.626 m)   Wt 199 lb (90.3 kg)   LMP 02/21/2023 (  Approximate)   SpO2 100%   BMI 34.16 kg/m   BSA 2.02 m      More Vitals   Flowsheets: Method of Visit,   BMI Metric Follow Up,   Falls,   PHQ2-9,   GAD-7:  Generalized Anxiety Disorder 7-Item Scale,   NEWS,   MEWS Score,   Vital Signs,   Anthropometrics  Encounter Info: Billing Info,   History,   Allergies,   Detailed Report  Given alternative form for Be Well completion Feb 2024.  Next labs due July 2024 RN Kimrey notified.

## 2023-03-15 NOTE — Telephone Encounter (Signed)
Shipment received and patient given additional 60 tabs gemfibrozil  po BID from [facility] 03/14/23

## 2023-03-20 ENCOUNTER — Other Ambulatory Visit (HOSPITAL_COMMUNITY): Payer: Self-pay

## 2023-03-20 ENCOUNTER — Telehealth: Payer: Self-pay

## 2023-03-20 NOTE — Telephone Encounter (Signed)
PA for TRULICITY 0.75 MG/0.5 ML PEN submitted via [email or web address]  Prior Auth (EOC) ID: [id number]

## 2023-03-20 NOTE — Telephone Encounter (Signed)
PA submitted via [email or web address]   Prior Auth (EOC) ID:   [id number]    Status pending

## 2023-03-20 NOTE — Telephone Encounter (Signed)
PA approved. Effective: 03/20/23-03/18/24 

## 2023-03-20 NOTE — Telephone Encounter (Signed)
PA approved. Effective: 03/20/23-03/18/24

## 2023-04-06 ENCOUNTER — Ambulatory Visit: Payer: No Typology Code available for payment source | Admitting: Primary Care

## 2023-04-20 ENCOUNTER — Encounter: Payer: Self-pay | Admitting: Primary Care

## 2023-04-20 ENCOUNTER — Ambulatory Visit: Payer: No Typology Code available for payment source | Admitting: Primary Care

## 2023-04-20 VITALS — BP 128/86 | HR 85 | Temp 97.9°F | Ht 64.0 in | Wt 207.0 lb

## 2023-04-20 DIAGNOSIS — E1165 Type 2 diabetes mellitus with hyperglycemia: Secondary | ICD-10-CM

## 2023-04-20 DIAGNOSIS — I10 Essential (primary) hypertension: Secondary | ICD-10-CM

## 2023-04-20 MED ORDER — TRULICITY 1.5 MG/0.5ML ~~LOC~~ SOAJ
1.5000 mg | SUBCUTANEOUS | 0 refills | Status: DC
Start: 2023-04-20 — End: 2023-08-24

## 2023-04-20 NOTE — Assessment & Plan Note (Signed)
Perhaps slightly improved based on glucose readings, she is about 50% in range for the last 2 weeks.  Discussed to inject fast acting insulin with meals only. Increase Trulicity to 1.5 mg weekly after 4 weeks of 0.5 mg weekly.  New prescription sent to pharmacy.  Will consult with pharmacy regarding her insulin regimen.  I do suspect the main culprit of her hyperglycemia to be her diet as it is carb heavy.  Discussed this in great detail today.  Recommended she reduce intake of pasta and potatoes.  Follow-up again in 1 month.

## 2023-04-20 NOTE — Progress Notes (Signed)
Subjective:    Patient ID: [name], female    DOB: [birth year], 42 y.o.   MRN: [id number]  HPI  [name] is a very pleasant 42 y.o. female with a history of hypertension, type 2 diabetes, neuropathy, hypertriglyceridemia who presents today for follow-up of diabetes and hypertension.  1) Type 2 Diabetes:  Current medications include: Metformin ER 1000 mg twice daily, Lantus 50 units twice daily, Novolin R 28 units 3 times daily with meals, Trulicity 0.75 mg weekly.  She denies constipation, nausea.   She has been injecting Lantus 60 units at bedtime for several months. She is injecting between 20-30 of Novolin R about 1 hour before all meals. She is also injecting another 10 units right after a meal.   She is checking her blood glucose continuously and is getting readings of:  AM fasting: 230's Before lunch: 130's  14 day average: 52% in range, 37% high, 11% very high 30 day average: 41% in range, 38% high, 21% very high  Last A1C: 11.0 in April 2024, 12.2 in January 2024 Last Eye Exam: Up-to-date Last Foot Exam: Up-to-date Pneumonia Vaccination: 2020 Urine Microalbumin: Up-to-date Statin: Atorvastatin  Diet currently consists of:  Breakfast: Eggs and sausage Lunch: Skips Dinner: Potatoes, pasta, baked meats Snacks: Lunch meat, sauteed mushrooms/onions  Desserts: Infrequently  Beverages: Water, diet soda  Exercise: 2 days weekly    2) Hypertension: Currently managed on lisinopril-hydrochlorothiazide 20-25 milligrams daily.  She denies dizziness, headaches, and chest pain.   BP Readings from Last 3 Encounters:  04/20/23 128/86  03/07/23 (!) 154/82  01/13/23 (!) 152/90         Review of Systems  Respiratory:  Negative for shortness of breath.   Cardiovascular:  Negative for chest pain.  Gastrointestinal:  Negative for constipation and nausea.         Past Medical History:  Diagnosis Date   Diabetes mellitus    Hypercholesteremia     Hypertension    Pancreatitis    Pneumonia due to COVID-19 virus 09/02/2020    Social History   Socioeconomic History   Marital status: Single    Spouse name: Not on file   Number of children: Not on file   Years of education: Not on file   Highest education level: Not on file  Occupational History   Not on file  Tobacco Use   Smoking status: Former   Smokeless tobacco: Never  Substance and Sexual Activity   Alcohol use: No   Drug use: No   Sexual activity: Never  Other Topics Concern   Not on file  Social History Narrative   Not on file   Social Determinants of Health   Financial Resource Strain: Not on file  Food Insecurity: Not on file  Transportation Needs: Not on file  Physical Activity: Not on file  Stress: Not on file  Social Connections: Not on file  Intimate Partner Violence: Not on file    Past Surgical History:  Procedure Laterality Date   KNEE SURGERY      Family History  Problem Relation Age of Onset   Cancer Other    Diabetes Other    Hypertension Other    Cancer Paternal Grandfather    Depression Mother    Diabetes Father    Hypertension Father    Depression Brother     No Known Allergies  Current Outpatient Medications on File Prior to Visit  Medication Sig Dispense Refill   atorvastatin (LIPITOR) 40 MG  tablet Take 1 tablet (40 mg total) by mouth daily. 90 tablet 3   FLUoxetine (PROZAC) 40 MG capsule Take 1 capsule (40 mg total) by mouth daily. for anxiety and depression. 90 capsule 0   gemfibrozil (LOPID) 600 MG tablet Take 1 tablet (600 mg total) by mouth 2 (two) times daily before a meal. 180 tablet 3   insulin glargine (LANTUS) 100 UNIT/ML Solostar Pen Inject 50 Units into the skin 2 (two) times daily. 15 mL 5   insulin regular (NOVOLIN R) 100 units/mL injection Inject 0.28 mLs (28 Units total) into the skin 3 (three) times daily before meals. 10 mL 5   metFORMIN (GLUCOPHAGE-XR) 500 MG 24 hr tablet Take 2 tablets (1,000 mg total) by  mouth 2 (two) times daily with a meal. for diabetes. 360 tablet 1   atorvastatin (LIPITOR) 40 MG tablet Take 1 tablet (40 mg total) by mouth daily. for cholesterol. (Patient not taking: Reported on 04/20/2023) 90 tablet 0   gemfibrozil (LOPID) 600 MG tablet Take 1 tablet (600 mg total) by mouth 2 (two) times daily before a meal. For cholesterol. (Patient not taking: Reported on 04/20/2023) 180 tablet 0   lisinopril-hydrochlorothiazide (ZESTORETIC) 20-25 MG tablet Take 1 tablet by mouth daily. for blood pressure. (Patient not taking: Reported on 04/20/2023) 90 tablet 0   lisinopril-hydrochlorothiazide (ZESTORETIC) 20-25 MG tablet Take 1 tablet by mouth daily. (Patient not taking: Reported on 04/20/2023) 90 tablet 3   No current facility-administered medications on file prior to visit.    BP 128/86   Pulse 85   Temp 97.9 F (36.6 C) (Temporal)   Ht 5\' 4"  (1.626 m)   Wt 207 lb (93.9 kg)   SpO2 98%   BMI 35.53 kg/m  Objective:   Physical Exam Cardiovascular:     Rate and Rhythm: Normal rate and regular rhythm.  Pulmonary:     Effort: Pulmonary effort is normal.     Breath sounds: Normal breath sounds.  Musculoskeletal:     Cervical back: Neck supple.  Skin:    General: Skin is warm and dry.           Assessment & Plan:  Type 2 diabetes mellitus with hyperglycemia, without long-term current use of insulin (HCC) Assessment & Plan: Perhaps slightly improved based on glucose readings, she is about 50% in range for the last 2 weeks.  Discussed to inject fast acting insulin with meals only. Increase Trulicity to 1.5 mg weekly after 4 weeks of 0.5 mg weekly.  New prescription sent to pharmacy.  Will consult with pharmacy regarding her insulin regimen.  I do suspect the main culprit of her hyperglycemia to be her diet as it is carb heavy.  Discussed this in great detail today.  Recommended she reduce intake of pasta and potatoes.  Follow-up again in 1 month.  Orders: -      Trulicity; Inject 1.5 mg into the skin once a week. for diabetes.  Dispense: 6 mL; Refill: 0  Essential hypertension Assessment & Plan: Controlled!  Continue lisinopril-hydrochlorothiazide 20-25 milligrams daily.         Doreene Nest, NP

## 2023-04-20 NOTE — Patient Instructions (Addendum)
Increase your Trulicity dose to 1.5 mg weekly after you use 4 of the 0.75 mg pens.  Please message me if you cannot get the Trulicity 1.5 mg pens from the pharmacy.  You must reduce consumption of pasta and potatoes.   Inject your Novolin R right as you eat meals.   Please schedule a follow up visit for 1 month. This can be a virtual visit.  It was a pleasure to see you today!

## 2023-04-20 NOTE — Assessment & Plan Note (Signed)
Controlled!  Continue lisinopril-hydrochlorothiazide 20-25 milligrams daily.

## 2023-05-04 ENCOUNTER — Other Ambulatory Visit: Payer: Self-pay | Admitting: Primary Care

## 2023-05-04 DIAGNOSIS — F4323 Adjustment disorder with mixed anxiety and depressed mood: Secondary | ICD-10-CM

## 2023-05-04 MED ORDER — FLUOXETINE HCL 40 MG PO CAPS
40.0000 mg | ORAL_CAPSULE | Freq: Every day | ORAL | 1 refills | Status: AC
Start: 2023-05-04 — End: ?

## 2023-05-04 NOTE — Telephone Encounter (Signed)
From: [name] To: Office of Doreene Nest, NP Sent: 05/03/2023 9:51 PM EDT Subject: Medication Renewal Request  Refills have been requested for the following medications:   FLUoxetine (PROZAC) 40 MG capsule [Brayah Urquilla K [place] Mckesson]  Preferred pharmacy: [facility] - [place] ([place]), [place] - [address] Delivery method: [employer]

## 2023-05-22 ENCOUNTER — Telehealth: Payer: No Typology Code available for payment source | Admitting: Primary Care

## 2023-06-07 ENCOUNTER — Other Ambulatory Visit: Payer: No Typology Code available for payment source | Admitting: Occupational Medicine

## 2023-06-07 DIAGNOSIS — Z0000 Encounter for general adult medical examination without abnormal findings: Secondary | ICD-10-CM

## 2023-06-07 DIAGNOSIS — E1165 Type 2 diabetes mellitus with hyperglycemia: Secondary | ICD-10-CM

## 2023-06-07 DIAGNOSIS — E559 Vitamin D deficiency, unspecified: Secondary | ICD-10-CM

## 2023-06-07 NOTE — Progress Notes (Signed)
Lab drawn from Left AC tolerated well no issues noted.   

## 2023-06-08 LAB — VITAMIN D 25 HYDROXY (VIT D DEFICIENCY, FRACTURES)

## 2023-06-08 LAB — HEMOGLOBIN A1C
Est. average glucose Bld gHb Est-mCnc: 206 mg/dL
Hgb A1c MFr Bld: 8.8 % — ABNORMAL HIGH (ref 4.8–5.6)

## 2023-06-12 DIAGNOSIS — E1165 Type 2 diabetes mellitus with hyperglycemia: Secondary | ICD-10-CM

## 2023-06-13 ENCOUNTER — Telehealth: Payer: No Typology Code available for payment source | Admitting: Primary Care

## 2023-06-13 MED ORDER — METFORMIN HCL ER 500 MG PO TB24
1000.0000 mg | ORAL_TABLET | Freq: Two times a day (BID) | ORAL | 0 refills | Status: AC
Start: 2023-06-13 — End: ?

## 2023-06-14 ENCOUNTER — Telehealth: Payer: Self-pay | Admitting: Occupational Medicine

## 2023-06-14 NOTE — Telephone Encounter (Signed)
[facility] message stated sample too lipemic unable to run vitamin D level. Patient reports has a history of this happening due to her genetics. Cholesterol will need to be improved before next redraw. Patient is taking all of her medications.  Your Hgba1c (3 month blood sugar average) improving. Congratulations!  Continue your medication/diet and exercise.  Avoid dehydration.  NP recommends activity after meals and snacks e.g. walk, house chores e.g. laundry/dishes/vacuum/sweep.  Next Hgba1c per PCM/follow up with PCM recommended.

## 2023-06-14 NOTE — Telephone Encounter (Signed)
-----   Message from Barbaraann Barthel sent at 06/08/2023  1:19 PM EDT ----- [facility] message stated sample too lipemic unable to run vitamin D level  Cholesterol will need to be improved before next redraw.  Verify if patient taking all of her medications history hypertriglyceridemia

## 2023-06-17 ENCOUNTER — Other Ambulatory Visit: Payer: Self-pay | Admitting: Registered Nurse

## 2023-06-17 DIAGNOSIS — Z8639 Personal history of other endocrine, nutritional and metabolic disease: Secondary | ICD-10-CM | POA: Insufficient documentation

## 2023-06-17 MED ORDER — VITAMIN D3 50 MCG (2000 UT) PO CAPS: 2000.0000 [IU] | ORAL_CAPSULE | Freq: Every day | ORAL | 3 refills | Status: DC

## 2023-06-17 NOTE — Progress Notes (Signed)
RN Bess Kinds notified NP she spoke with patient and patient had been taking her cholesterol medications prior to serum draw for Vitamin D level.  She reviewed Hgba1c results with patient.  Discussed with patient no repeat level at this time due to elevated lipids.  History of vitamin D deficiency not taking any supplement recommend starting 2000 units daily with meal cholecalciferol Rx sent to her pharmacy of choice.

## 2023-06-28 ENCOUNTER — Encounter (INDEPENDENT_AMBULATORY_CARE_PROVIDER_SITE_OTHER): Payer: Self-pay

## 2023-07-19 ENCOUNTER — Ambulatory Visit: Payer: No Typology Code available for payment source | Admitting: Primary Care

## 2023-07-25 ENCOUNTER — Encounter (HOSPITAL_BASED_OUTPATIENT_CLINIC_OR_DEPARTMENT_OTHER): Payer: Self-pay | Admitting: Internal Medicine

## 2023-07-25 ENCOUNTER — Ambulatory Visit (HOSPITAL_BASED_OUTPATIENT_CLINIC_OR_DEPARTMENT_OTHER): Payer: No Typology Code available for payment source | Admitting: Internal Medicine

## 2023-07-25 ENCOUNTER — Other Ambulatory Visit (HOSPITAL_COMMUNITY): Payer: Self-pay

## 2023-07-25 ENCOUNTER — Telehealth: Payer: Self-pay

## 2023-07-25 ENCOUNTER — Telehealth (HOSPITAL_BASED_OUTPATIENT_CLINIC_OR_DEPARTMENT_OTHER): Payer: Self-pay

## 2023-07-25 ENCOUNTER — Telehealth (HOSPITAL_BASED_OUTPATIENT_CLINIC_OR_DEPARTMENT_OTHER): Payer: Self-pay | Admitting: *Deleted

## 2023-07-25 VITALS — BP 123/85 | HR 83 | Ht 64.0 in | Wt 198.7 lb

## 2023-07-25 DIAGNOSIS — E1165 Type 2 diabetes mellitus with hyperglycemia: Secondary | ICD-10-CM

## 2023-07-25 DIAGNOSIS — Z8719 Personal history of other diseases of the digestive system: Secondary | ICD-10-CM

## 2023-07-25 DIAGNOSIS — Z794 Long term (current) use of insulin: Secondary | ICD-10-CM | POA: Diagnosis not present

## 2023-07-25 DIAGNOSIS — E783 Hyperchylomicronemia: Secondary | ICD-10-CM

## 2023-07-25 MED ORDER — FENOFIBRATE 160 MG PO TABS
160.0000 mg | ORAL_TABLET | Freq: Every day | ORAL | 3 refills | Status: AC
Start: 2023-07-25 — End: ?

## 2023-07-25 MED ORDER — ICOSAPENT ETHYL 1 G PO CAPS: 2.0000 g | ORAL_CAPSULE | Freq: Two times a day (BID) | ORAL | 3 refills | Status: AC

## 2023-07-25 NOTE — Progress Notes (Signed)
LIPID CLINIC CONSULT NOTE  Chief Complaint:  High triglycerides  Primary Care Physician: [name] Nest, NP  Primary Cardiologist:  None  HPI:  [name] is a 42 y.o. female who is being seen today for the evaluation of high trigylcerides at the request of [name] Nest, NP.  This is a 42 year old female kindly referred for evaluation management of high triglycerides.  Unfortunately she also has a history of pancreatitis having had 3 episodes over the past 10 years.  She notes a longstanding history of high triglycerides although is not aware of any family members that have that.  She has several half-brothers and neither of her parents had had episodes of this to her knowledge.  Her father did have diabetes and she has this as well.  Her high triglycerides seem to track with poorly controlled diabetes.  She had seen an endocrinologist once but is generally managed by her primary care provider.  About 7 months ago hemoglobin A1c had tracked up to 12.2%.  At that time her total cholesterol was 875 and triglycerides were greater than 10,000 with an HDL of 28.  In general her triglycerides have ranged between thousand and 10,000 in the past.  More recently her triglycerides were 1833 with total cholesterol 293 and direct LDL of 74 in April 2024.  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  Currently they are improving.  She had previously been on fenofibrate but is was switched back to gemfibrozil because medication was considered free with her insurance.  PMHx:  Past Medical History:  Diagnosis Date   Diabetes mellitus    Hypercholesteremia    Hypertension    Pancreatitis    Pneumonia due to COVID-19 virus 09/02/2020    Past Surgical History:  Procedure Laterality Date   KNEE SURGERY      FAMHx:  Family History  Problem Relation Age of Onset   Cancer Other    Diabetes Other    Hypertension Other    Cancer  Paternal Grandfather    Depression Mother    Diabetes Father    Hypertension Father    Depression Brother     SOCHx:   reports that she has quit smoking. She has never used smokeless tobacco. She reports that she does not drink alcohol and does not use drugs.  ALLERGIES:  No Known Allergies  ROS: Pertinent items noted in HPI and remainder of comprehensive ROS otherwise negative.  HOME MEDS: Current Outpatient Medications on File Prior to Visit  Medication Sig Dispense Refill   atorvastatin (LIPITOR) 40 MG tablet Take 1 tablet (40 mg total) by mouth daily. 90 tablet 3   Dulaglutide (TRULICITY) 1.5 MG/0.5ML SOPN Inject 1.5 mg into the skin once a week. for diabetes. 6 mL 0   FLUoxetine (PROZAC) 40 MG capsule Take 1 capsule (40 mg total) by mouth daily. for anxiety and depression. 90 capsule 1   insulin glargine (LANTUS) 100 UNIT/ML Solostar Pen Inject 50 Units into the skin 2 (two) times daily. 15 mL 5   insulin regular (NOVOLIN R) 100 units/mL injection Inject 0.28 mLs (28 Units total) into the skin 3 (three) times daily before meals. 10 mL 5   lisinopril-hydrochlorothiazide (ZESTORETIC) 20-25 MG tablet Take 1 tablet by mouth daily. for blood pressure. 90 tablet 0   lisinopril-hydrochlorothiazide (ZESTORETIC) 20-25 MG tablet Take 1 tablet by mouth daily. 90 tablet 3   metFORMIN (GLUCOPHAGE-XR) 500 MG 24 hr tablet Take  2 tablets (1,000 mg total) by mouth 2 (two) times daily with a meal. for diabetes. 360 tablet 0   No current facility-administered medications on file prior to visit.    LABS/IMAGING: No results found for this or any previous visit (from the past 48 hour(s)). No results found.  LIPID PANEL:    Component Value Date/Time   CHOL 293 (H) 03/07/2023 1453   CHOL 344 (H) 10/13/2014 0423   TRIG (H) 03/07/2023 1453    1833.0 Triglyceride is over 400; calculations on Lipids are invalid.   TRIG 635 (H) 02/20/2015 0523   HDL 39.70 03/07/2023 1453   HDL 17 (L) 10/13/2014  0423   CHOLHDL 7 03/07/2023 1453   VLDL UNABLE TO CALCULATE IF TRIGLYCERIDE OVER 400 mg/dL 09/81/1914 7829   VLDL SEE COMMENT 10/13/2014 0423   LDLCALC  01/13/2023 1507     Comment:     Result not calculated because one or more required values exceed analytical limits. Reference range: <100 . Desirable range <100 mg/dL for primary prevention;   <70 mg/dL for patients with CHD or diabetic patients  with > or = 2 CHD risk factors. Marland Kitchen LDL-C is now calculated using the Martin-Hopkins  calculation, which is a validated novel method providing  better accuracy than the Friedewald equation in the  estimation of LDL-C.  Horald Pollen et al. Lenox Ahr. 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  It would be helpful to do genetic testing to further understand the etiology of her disease and may make her a candidate for upcoming clinical trials.  Her regimen is reasonable and she has had improvement in triglycerides which tend to track with her A1c.  She needs better glycemic control and may benefit from seeing an endocrinologist.  I would recommend switching her gemfibrozil back  to fenofibrate 160 mg daily since this has less interactions with atorvastatin.  It should be of minimal cost but not free.  In addition she would benefit from the addition of a icosapent ethyl (Vascepa) which is available as generic, 2 g twice daily.  I will maintain her name and her research database that she is a good candidate for hopefully an upcoming clinical trial.  Thank you for this interesting referral.  Chrystie Nose, MD, [facility], FACP  [place]  [facility]  Medical Director of the Advanced Lipid Disorders &  Cardiovascular Risk Reduction Clinic Diplomate of the American Board of Clinical Lipidology Attending Cardiologist  Direct Dial: [phone]  Fax: [phone]  Website:  [email or web address] Nicely Kenson Groh 07/25/2023, 8:42 AM

## 2023-07-25 NOTE — Telephone Encounter (Signed)
Patient started on generic vascepa and fenofibrate today in LIPID Clinic will likely need prior auths, please route updates to [employer]

## 2023-07-25 NOTE — Telephone Encounter (Signed)
Genetic test for [name] ordered ([facility]) Cheek swab completed in office Specimen and necessary paperwork mailed. ID: [id number]

## 2023-07-25 NOTE — Telephone Encounter (Signed)
Tipps, Shaniqua T, CPhT  Wilson, Kaila M, RN; Rx Prior Auth Team14 minutes ago (12:08 PM)    Per test claim: P/A is already on file for fenofibrate until 09/30/2023, and it was also filled on today 8/27. However, a P/A for Iscosapent ethyl was needed and submitted via (Prompt P/A) portal and is currently pending. Please see my new encounter for test claims and further detail.

## 2023-07-25 NOTE — Telephone Encounter (Signed)
Pharmacy Patient Advocate Encounter  Insurance verification completed.    The patient is insured through [facility]   Ran test claim for FENOFIBRATE. The Rx has been filled as of today 07/25/23 and has an effective authorization on file until 09/30/2023           This test claim was processed through [facility]- copay amounts may vary at other pharmacies due to pharmacy/plan contracts, or as the patient moves through the different stages of their insurance plan.

## 2023-07-25 NOTE — Telephone Encounter (Signed)
Pharmacy Patient Advocate Encounter   Received notification from Physician's Office/RN JENNA E. that prior authorization for ICOSAPENT ETHYL 1Gis required/requested.   Insurance verification completed.   The patient is insured through  [employer]  .   Per test claim: PA required; PA submitted to [employer] via Prompt PA Key/confirmation #/EOC [id number]  Status is pending

## 2023-07-25 NOTE — Patient Instructions (Signed)
Medication Instructions:  Your physician has recommended you make the following change in your medication:   Stop: Gemfibrozil   Start: Fenofibrate 160mg  daily  Start: Icosapent ethyl 2g twice daily    Lab Work: Your physician recommends that you return for lab work in the next week or so fasting NMR, Direct LDL, and LPA   Your physician recommends that you return for lab work in 3-4 months for fasting NMR, Direct LDL, and LPA    Follow-Up: At [facility], you and your health needs are our priority.  As part of our continuing mission to provide you with exceptional heart care, we have created designated Provider Care Teams.  These Care Teams include your primary Cardiologist (physician) and Advanced Practice Providers (APPs -  Physician Assistants and Nurse Practitioners) who all work together to provide you with the care you need, when you need it.  We recommend signing up for the patient portal called "MyChart".  Sign up information is provided on this After Visit Summary.  MyChart is used to connect with patients for Virtual Visits (Telemedicine).  Patients are able to view lab/test results, encounter notes, upcoming appointments, etc.  Non-urgent messages can be sent to your provider as well.   To learn more about what you can do with MyChart, go to [email or web address].    Your next appointment:   3-4 months with Dr. Rennis Golden in Lipid Clinic

## 2023-07-28 ENCOUNTER — Other Ambulatory Visit (HOSPITAL_COMMUNITY): Payer: Self-pay

## 2023-07-28 NOTE — Telephone Encounter (Signed)
   Pharmacy Patient Advocate Encounter  Received notification from [employer] that Prior Authorization for ICOSAPENT ETHYL has been APPROVED until 07/24/2024. Ran test claim, Copay is $20.00 for a 90day supply .      This test claim was processed through [facility]- copay amounts may vary at other pharmacies due to pharmacy/plan contracts, or as the patient moves through the different stages of their insurance plan.

## 2023-07-28 NOTE — Telephone Encounter (Signed)
Update sent to patient via MyChart 

## 2023-08-06 ENCOUNTER — Encounter (HOSPITAL_BASED_OUTPATIENT_CLINIC_OR_DEPARTMENT_OTHER): Payer: Self-pay | Admitting: Internal Medicine

## 2023-08-08 LAB — NMR, LIPOPROFILE
Cholesterol, Total: 221 mg/dL — ABNORMAL HIGH (ref 100–199)
HDL Particle Number: 25.8 umol/L — ABNORMAL LOW (ref >=30.5)
HDL-C: 33 mg/dL — ABNORMAL LOW (ref >39)
LDL Particle Number: 809 nmol/L (ref <1000)
LDL Size: 19.2 nm — ABNORMAL LOW (ref >20.5)
LP-IR Score: 96 — ABNORMAL HIGH (ref <=45)
Small LDL Particle Number: 556 nmol/L — ABNORMAL HIGH (ref <=527)
Triglycerides: 937 mg/dL (ref 0–149)

## 2023-08-08 LAB — LDL CHOLESTEROL, DIRECT: LDL Direct: 63 mg/dL (ref 0–99)

## 2023-08-08 LAB — LIPOPROTEIN A (LPA): Lipoprotein (a): <8.4 nmol/L (ref <75.0)

## 2023-08-21 ENCOUNTER — Encounter (HOSPITAL_BASED_OUTPATIENT_CLINIC_OR_DEPARTMENT_OTHER): Payer: Self-pay | Admitting: Internal Medicine

## 2023-08-23 NOTE — Telephone Encounter (Signed)
Ok thanks .Marland Kitchen See if she could stop by [facility] on Friday and have the sample taken.  I will inform [facility] so there is no charge.  Dr. Rexene Edison

## 2023-08-24 ENCOUNTER — Ambulatory Visit: Payer: No Typology Code available for payment source | Admitting: Primary Care

## 2023-08-24 VITALS — BP 144/80 | HR 90 | Temp 97.2°F | Ht 64.0 in | Wt 201.0 lb

## 2023-08-24 DIAGNOSIS — F4323 Adjustment disorder with mixed anxiety and depressed mood: Secondary | ICD-10-CM

## 2023-08-24 DIAGNOSIS — E1165 Type 2 diabetes mellitus with hyperglycemia: Secondary | ICD-10-CM | POA: Diagnosis not present

## 2023-08-24 DIAGNOSIS — Z7984 Long term (current) use of oral hypoglycemic drugs: Secondary | ICD-10-CM

## 2023-08-24 LAB — POCT GLYCOSYLATED HEMOGLOBIN (HGB A1C): Hemoglobin A1C: 10.1 % — AB (ref 4.0–5.6)

## 2023-08-24 MED ORDER — TRULICITY 3 MG/0.5ML ~~LOC~~ SOAJ
3.0000 mg | SUBCUTANEOUS | 1 refills | Status: DC
Start: 2023-08-24 — End: 2024-02-15

## 2023-08-24 MED ORDER — FLUOXETINE HCL 20 MG PO TABS
20.0000 mg | ORAL_TABLET | Freq: Every day | ORAL | 3 refills | Status: AC
Start: 2023-08-24 — End: ?

## 2023-08-24 NOTE — Assessment & Plan Note (Signed)
Deteriorated.  Increase fluoxetine to 60 mg daily.  Will add additional 20 mg tablet to her existing 40 mg dose.  Follow-up in 3 months.

## 2023-08-24 NOTE — Assessment & Plan Note (Signed)
Deteriorated and uncontrolled with A1c of 10.1 today.  Discussed to work on emotional eating.  Will work to treat anxiety/irritability.  Increase Trulicity to 3 mg weekly to help with cravings. Continue metformin XR 1000 mg twice daily. Continue Lantus 60 units daily and Novolin R 15 to 30 units with meals.  Foot exam today. Urine microalbumin due and pending.

## 2023-08-24 NOTE — Patient Instructions (Addendum)
We increased the dose of your fluoxetine by adding an additional 20 mg.  Take both the 40 mg pill and the 20 mg pill for anxiety/depression.  We increase the dose of your Trulicity for diabetes to 3 mg weekly.  Work on [employer].  Please schedule a physical to meet with me in 3 months.   It was a pleasure to see you today!

## 2023-08-24 NOTE — Progress Notes (Signed)
Subjective:    Patient ID: [name], female    DOB: 1981/05/28, 42 y.o.   MRN: [id number]  HPI  [name] is a very pleasant 42 y.o. female with a history of hypertension, type 2 diabetes, hypertriglyceridemia, pancreatitis who presents today for follow-up of diabetes.  She would also like to discuss her anxiety.  Current medications include: Trulicity 1.5 mg weekly, metformin XR 1000 mg twice daily, Lantus insulin 60 daily, Novolin R 15-30 with meals.   She is checking her blood glucose continuously times daily and is getting readings:   14 day average: 41% in range, 46%, 13% very high 30 day average: 40% in range, 40% high, 20% very high  Last A1C: 8.8 in July 2024, 10.1 today Last Eye Exam: Up-to-date Last Foot Exam: Due Pneumonia Vaccination: 2020 Urine Microalbumin: Due Statin: Atorvastatin  Dietary changes since last visit: Increased food cravings for sweets, emotionally eating.    Exercise: None  BP Readings from Last 3 Encounters:  08/24/23 (!) 144/80  07/25/23 123/85  04/20/23 128/86   Over the last 2 months she's noticed increased irritability, overeating, feeling more emotional at work, being rude to her co-workers. She is managed on fluoxetine 40 mg daily. She questions if she needs a dose increase. She feels that it helps with her depression.   Wt Readings from Last 3 Encounters:  08/24/23 201 lb (91.2 kg)  07/25/23 198 lb 11.2 oz (90.1 kg)  04/20/23 207 lb (93.9 kg)     Review of Systems  Respiratory:  Negative for shortness of breath.   Cardiovascular:  Negative for chest pain.  Neurological:  Negative for numbness.  Psychiatric/Behavioral:  The patient is nervous/anxious.          Past Medical History:  Diagnosis Date   Diabetes mellitus    Hypercholesteremia    Hypertension    Pancreatitis    Pneumonia due to COVID-19 virus 09/02/2020    Social History   Socioeconomic History   Marital status: Single    Spouse name: Not  on file   Number of children: Not on file   Years of education: Not on file   Highest education level: Associate degree: academic program  Occupational History   Not on file  Tobacco Use   Smoking status: Former   Smokeless tobacco: Never  Vaping Use   Vaping status: Never Used  Substance and Sexual Activity   Alcohol use: No   Drug use: No   Sexual activity: Never  Other Topics Concern   Not on file  Social History Narrative   Not on file   Social Determinants of Health   Financial Resource Strain: Medium Risk (08/24/2023)   Overall Financial Resource Strain (CARDIA)    Difficulty of Paying Living Expenses: Somewhat hard  Food Insecurity: No Food Insecurity (08/24/2023)   Hunger Vital Sign    Worried About Running Out of Food in the Last Year: Never true    Ran Out of Food in the Last Year: Never true  Transportation Needs: No Transportation Needs (08/24/2023)   PRAPARE - Administrator, Civil Service (Medical): No    Lack of Transportation (Non-Medical): No  Physical Activity: Insufficiently Active (08/24/2023)   Exercise Vital Sign    Days of Exercise per Week: 2 days    Minutes of Exercise per Session: 20 min  Stress: No Stress Concern Present (08/24/2023)   [employer] of Occupational Health - Occupational Stress Questionnaire    Feeling  of Stress : Only a little  Social Connections: Socially Isolated (08/24/2023)   Social Connection and Isolation Panel [NHANES]    Frequency of Communication with Friends and Family: Once a week    Frequency of Social Gatherings with Friends and Family: Never    Attends Religious Services: Never    Diplomatic Services operational officer: No    Attends Engineer, structural: Not on file    Marital Status: Living with partner  Intimate Partner Violence: Not on file    Past Surgical History:  Procedure Laterality Date   KNEE SURGERY      Family History  Problem Relation Age of Onset   Cancer Other     Diabetes Other    Hypertension Other    Cancer Paternal Grandfather    Depression Mother    Diabetes Father    Hypertension Father    Depression Brother     No Known Allergies  Current Outpatient Medications on File Prior to Visit  Medication Sig Dispense Refill   atorvastatin (LIPITOR) 40 MG tablet Take 1 tablet (40 mg total) by mouth daily. 90 tablet 3   fenofibrate 160 MG tablet Take 1 tablet (160 mg total) by mouth daily. 90 tablet 3   FLUoxetine (PROZAC) 40 MG capsule Take 1 capsule (40 mg total) by mouth daily. for anxiety and depression. 90 capsule 1   icosapent Ethyl (VASCEPA) 1 g capsule Take 2 capsules (2 g total) by mouth 2 (two) times daily. 360 capsule 3   insulin glargine (LANTUS) 100 UNIT/ML Solostar Pen Inject 50 Units into the skin 2 (two) times daily. 15 mL 5   insulin regular (NOVOLIN R) 100 units/mL injection Inject 0.28 mLs (28 Units total) into the skin 3 (three) times daily before meals. 10 mL 5   lisinopril-hydrochlorothiazide (ZESTORETIC) 20-25 MG tablet Take 1 tablet by mouth daily. for blood pressure. 90 tablet 0   lisinopril-hydrochlorothiazide (ZESTORETIC) 20-25 MG tablet Take 1 tablet by mouth daily. 90 tablet 3   metFORMIN (GLUCOPHAGE-XR) 500 MG 24 hr tablet Take 2 tablets (1,000 mg total) by mouth 2 (two) times daily with a meal. for diabetes. 360 tablet 0   No current facility-administered medications on file prior to visit.    BP (!) 144/80   Pulse 90   Temp (!) 97.2 F (36.2 C) (Temporal)   Ht 5\' 4"  (1.626 m)   Wt 201 lb (91.2 kg)   SpO2 98%   BMI 34.50 kg/m  Objective:   Physical Exam Cardiovascular:     Rate and Rhythm: Normal rate and regular rhythm.  Pulmonary:     Effort: Pulmonary effort is normal.     Breath sounds: Normal breath sounds.  Musculoskeletal:     Cervical back: Neck supple.  Skin:    General: Skin is warm and dry.           Assessment & Plan:  Type 2 diabetes mellitus with hyperglycemia, without long-term  current use of insulin (HCC) Assessment & Plan: Deteriorated and uncontrolled with A1c of 10.1 today.  Discussed to work on emotional eating.  Will work to treat anxiety/irritability.  Increase Trulicity to 3 mg weekly to help with cravings. Continue metformin XR 1000 mg twice daily. Continue Lantus 60 units daily and Novolin R 15 to 30 units with meals.  Foot exam today. Urine microalbumin due and pending.  Orders: -     POCT glycosylated hemoglobin (Hb A1C) -     Microalbumin / creatinine urine  ratio -     Trulicity; Inject 3 mg as directed once a week. for diabetes.  Dispense: 6 mL; Refill: 1  Adjustment disorder with mixed anxiety and depressed mood Assessment & Plan: Deteriorated.  Increase fluoxetine to 60 mg daily.  Will add additional 20 mg tablet to her existing 40 mg dose.  Follow-up in 3 months.  Orders: -     FLUoxetine HCl; Take 1 tablet (20 mg total) by mouth daily. for anxiety and depression. Take with 40 mg dose.  Dispense: 90 tablet; Refill: 3        Doreene Nest, NP

## 2023-08-25 ENCOUNTER — Encounter (HOSPITAL_BASED_OUTPATIENT_CLINIC_OR_DEPARTMENT_OTHER): Payer: Self-pay

## 2023-08-25 ENCOUNTER — Ambulatory Visit (HOSPITAL_BASED_OUTPATIENT_CLINIC_OR_DEPARTMENT_OTHER): Payer: No Typology Code available for payment source

## 2023-08-25 LAB — MICROALBUMIN / CREATININE URINE RATIO
Creatinine,U: 67.4 mg/dL
Microalb Creat Ratio: 53.5 mg/g — ABNORMAL HIGH (ref 0.0–30.0)
Microalb, Ur: 36.1 mg/dL — ABNORMAL HIGH (ref 0.0–1.9)

## 2023-09-07 ENCOUNTER — Telehealth: Payer: Self-pay | Admitting: Primary Care

## 2023-09-07 NOTE — Telephone Encounter (Signed)
Faxed as requested

## 2023-09-07 NOTE — Telephone Encounter (Signed)
Form has been faxed.

## 2023-09-07 NOTE — Telephone Encounter (Signed)
Called and spoke with patient she states she was using the [place], but was allergic to the glue used to attach so she had to stop using them. She tried to switch to [facility], but her insurance would not cover. That is why is is looking for an alternative implantable 6 mo device.

## 2023-09-07 NOTE — Telephone Encounter (Signed)
Please notify patient that I received the form for the implantable continuous glucose monitoring device.  That she currently have a Dexcom or freestyle libre CGM?  If not, was there a problem with the insurance?

## 2023-09-07 NOTE — Telephone Encounter (Signed)
[name] from [facility] contacted the office regarding this form, states they are needing OV notes from last 2 visits to be faxed over. Can be sent to [phone]

## 2023-09-07 NOTE — Telephone Encounter (Signed)
Noted, completed form and placed in Kelli's inbox

## 2023-09-21 ENCOUNTER — Ambulatory Visit: Payer: No Typology Code available for payment source

## 2023-09-21 DIAGNOSIS — Z23 Encounter for immunization: Secondary | ICD-10-CM

## 2023-10-03 ENCOUNTER — Telehealth: Payer: Self-pay | Admitting: Internal Medicine

## 2023-10-03 NOTE — Telephone Encounter (Signed)
Genetic test for dyslipidemia/ASCVD ordered ([facility]) -- 3rd test/re-test Cheek swab completed in office Specimen and necessary paperwork mailed. ID: [id number]

## 2023-11-01 ENCOUNTER — Encounter (HOSPITAL_BASED_OUTPATIENT_CLINIC_OR_DEPARTMENT_OTHER): Payer: Self-pay | Admitting: Internal Medicine

## 2023-11-09 ENCOUNTER — Encounter: Payer: Self-pay | Admitting: Internal Medicine

## 2023-11-23 ENCOUNTER — Encounter: Payer: No Typology Code available for payment source | Admitting: Primary Care

## 2024-01-02 NOTE — Progress Notes (Signed)
 I, [name] Batter, CMA acting as a scribe for Artist Lloyd, MD.  [name] is a 43 y.o. female who presents to [facility] today for right knee pain ongoing for a couple weeks. Injury occurred when missing a step on a ladder and landing awkwardly on her leg. Pt locates pain to posterior aspect. Hx of ACL injury. Feels unstable, worse with stairs, down>up. Pain with flexion. Able to move side-to-side without issue.   Knee swelling: no Mechanical symptoms: popping, unstable Aggravates: flexion Treatments tried: brace  Pertinent review of systems: No fevers or chills  Relevant historical information: History of right knee ACL reconstruction 1999.  Diabetes previously not well-controlled currently well-controlled. Eversense Diabetes Continuous Glucometer implant (looks to be MRI safe/conditional)  Exam:  BP (!) 158/84   Pulse 88   Ht 5' 4 (1.626 m)   Wt 213 lb (96.6 kg)   SpO2 98%   BMI 36.56 kg/m  General: Well Developed, well nourished, and in no acute distress.   MSK: Right knee mild effusion.  Mature scar anterior knee from ACL reconstruction.  Normal motion. Tender palpation mild at medial joint line. Ligamentous exam slight laxity to anterior drawer test.  No laxity to posterior drawer test or valgus or varus stress test. Negative Murray's test. Intact strength.    Lab and Radiology Results  Procedure: Real-time Ultrasound Guided Injection of right knee joint superior lateral patella space Device: Philips Affiniti 50G/GE Logiq Images permanently stored and available for review in PACS Ultrasound evaluation prior to injection reveals a small joint effusion.  No definitive tear is visible at either the medial or lateral meniscus although there is degeneration of the medial meniscus.  No Baker's cyst visible. Verbal informed consent obtained.  Discussed risks and benefits of procedure. Warned about infection, bleeding, hyperglycemia  damage to structures among others. Patient expresses understanding and agreement Time-out conducted.   Noted no overlying erythema, induration, or other signs of local infection.   Skin prepped in a sterile fashion.   Local anesthesia: Topical Ethyl chloride.   With sterile technique and under real time ultrasound guidance: 40 mg of Kenalog and 2 mL of Marcaine injected into knee joint. Fluid seen entering the joint capsule.   Completed without difficulty   Pain immediately resolved suggesting accurate placement of the medication.   Advised to call if fevers/chills, erythema, induration, drainage, or persistent bleeding.   Images permanently stored and available for review in the ultrasound unit.  Impression: Technically successful ultrasound guided injection.   X-ray images right knee obtained today personally and independently interpreted. Status post ACL reconstruction.  Mild medial and patellofemoral DJD. Await formal radiology review        Assessment and Plan: 43 y.o. female with right knee pain and effusion after stepping down awkwardly from a ladder.  This occurs in the setting of an ACL tear and reconstruction about 25 years ago.  She is feeling some discomfort or stiffness in her knee that is due to the knee effusion.  She does have a little bit of laxity on anterior drawer test which I think is secondary to her reconstruction and not indicative of a current ACL tear.  Plan for trial of steroid injection and continued knee brace.  However if not improving following the injection neck step should be MRI to evaluate integrity of ACL and meniscus more accurately.  We did talk about her diabetes.  She currently has blood sugar in the less than 180  range almost all the time.  We can expect some hyperglycemia into the 200s for a few days after today's injection.  She does have a implanted continuous glucometer.  The implant looking online is MRI safe but the external reader device  needs to be removed prior to the MRI.  Will make sure that we inform MRI about this implant if working to proceed with an MRI.   PDMP not reviewed this encounter. Orders Placed This Encounter  Procedures   US  LIMITED JOINT SPACE STRUCTURES LOW RIGHT(NO LINKED CHARGES)    Reason for Exam (SYMPTOM  OR DIAGNOSIS REQUIRED):   right knee instability    Preferred imaging location?:   [facility] Knee AP/LAT W/Sunrise Right    Standing Status:   Future    Expiration Date:   01/31/2024    Reason for Exam (SYMPTOM  OR DIAGNOSIS REQUIRED):   right knee injury    Preferred imaging location?:   [facility]    Is patient pregnant?:   No   No orders of the defined types were placed in this encounter.    Discussed warning signs or symptoms. Please see discharge instructions. Patient expresses understanding.   The above documentation has been reviewed and is accurate and complete Artist Lloyd, M.D.

## 2024-01-03 ENCOUNTER — Ambulatory Visit: Payer: No Typology Code available for payment source | Admitting: Primary Care

## 2024-01-03 ENCOUNTER — Ambulatory Visit (INDEPENDENT_AMBULATORY_CARE_PROVIDER_SITE_OTHER): Payer: No Typology Code available for payment source

## 2024-01-03 ENCOUNTER — Ambulatory Visit: Payer: Self-pay

## 2024-01-03 ENCOUNTER — Encounter: Payer: Self-pay | Admitting: Family Medicine

## 2024-01-03 ENCOUNTER — Encounter: Payer: Self-pay | Admitting: Primary Care

## 2024-01-03 ENCOUNTER — Ambulatory Visit: Payer: No Typology Code available for payment source | Admitting: Family Medicine

## 2024-01-03 VITALS — BP 138/88 | HR 80 | Temp 97.2°F | Ht 64.0 in | Wt 212.0 lb

## 2024-01-03 VITALS — BP 158/84 | HR 88 | Ht 64.0 in | Wt 213.0 lb

## 2024-01-03 DIAGNOSIS — E1165 Type 2 diabetes mellitus with hyperglycemia: Secondary | ICD-10-CM | POA: Diagnosis not present

## 2024-01-03 DIAGNOSIS — Z794 Long term (current) use of insulin: Secondary | ICD-10-CM

## 2024-01-03 DIAGNOSIS — E781 Pure hyperglyceridemia: Secondary | ICD-10-CM

## 2024-01-03 DIAGNOSIS — E559 Vitamin D deficiency, unspecified: Secondary | ICD-10-CM

## 2024-01-03 DIAGNOSIS — G8929 Other chronic pain: Secondary | ICD-10-CM

## 2024-01-03 DIAGNOSIS — F4323 Adjustment disorder with mixed anxiety and depressed mood: Secondary | ICD-10-CM

## 2024-01-03 DIAGNOSIS — M25561 Pain in right knee: Secondary | ICD-10-CM

## 2024-01-03 DIAGNOSIS — Z0000 Encounter for general adult medical examination without abnormal findings: Secondary | ICD-10-CM | POA: Diagnosis not present

## 2024-01-03 DIAGNOSIS — Z7984 Long term (current) use of oral hypoglycemic drugs: Secondary | ICD-10-CM

## 2024-01-03 DIAGNOSIS — Z1231 Encounter for screening mammogram for malignant neoplasm of breast: Secondary | ICD-10-CM

## 2024-01-03 DIAGNOSIS — I10 Essential (primary) hypertension: Secondary | ICD-10-CM

## 2024-01-03 LAB — COMPREHENSIVE METABOLIC PANEL
ALT: 11 U/L (ref 0–35)
AST: 11 U/L (ref 0–37)
Albumin: 4.1 g/dL (ref 3.5–5.2)
Alkaline Phosphatase: 46 U/L (ref 39–117)
BUN: 11 mg/dL (ref 6–23)
CO2: 25 meq/L (ref 19–32)
Calcium: 8.8 mg/dL (ref 8.4–10.5)
Chloride: 104 meq/L (ref 96–112)
Creatinine, Ser: 0.43 mg/dL (ref 0.40–1.20)
GFR: 118.64 mL/min (ref >60.00)
Glucose, Bld: 215 mg/dL — ABNORMAL HIGH (ref 70–99)
Potassium: 4.5 meq/L (ref 3.5–5.1)
Sodium: 138 meq/L (ref 135–145)
Total Bilirubin: 0.5 mg/dL (ref 0.2–1.2)
Total Protein: 6.8 g/dL (ref 6.0–8.3)

## 2024-01-03 LAB — VITAMIN D 25 HYDROXY (VIT D DEFICIENCY, FRACTURES): VITD: <7 ng/mL — ABNORMAL LOW (ref 30.00–100.00)

## 2024-01-03 LAB — LIPID PANEL
Cholesterol: 216 mg/dL — ABNORMAL HIGH (ref 0–200)
HDL: 35.9 mg/dL — ABNORMAL LOW (ref >39.00)
NonHDL: 180.06
Total CHOL/HDL Ratio: 6
Triglycerides: 1084 mg/dL — ABNORMAL HIGH (ref 0.0–149.0)
VLDL: 216.8 mg/dL — ABNORMAL HIGH (ref 0.0–40.0)

## 2024-01-03 LAB — HEMOGLOBIN A1C: Hgb A1c MFr Bld: 8.2 % — ABNORMAL HIGH (ref 4.6–6.5)

## 2024-01-03 LAB — LDL CHOLESTEROL, DIRECT: Direct LDL: 51 mg/dL

## 2024-01-03 NOTE — Assessment & Plan Note (Signed)
 Controlled.  Continue fluoxetine  60 mg daily.

## 2024-01-03 NOTE — Assessment & Plan Note (Signed)
 Following with lipid clinic.  Continue atorvastatin  40 mg daily, Vascepa  2g BID, fenofibrate  160 mg daily. Repeat lipid panel pending.

## 2024-01-03 NOTE — Assessment & Plan Note (Signed)
 Glucose readings are in range >80% of time! Commended her on this!  Continue Lantus  50 units daily, Trulicity  3 mg weekly, metformin  ER 1000 mg BID, Novolin R 15-20 units TID with meals.  A1C pending today. Discussed to schedule eye exam.  Follow up in 3-6 months based on A1C test.

## 2024-01-03 NOTE — Patient Instructions (Signed)
 Stop by the lab prior to leaving today. I will notify you of your results once received.   Reschedule your eye exam.  [facility] to schedule your mammogram.   Please schedule a follow up visit for 6 months for a diabetes check.  Schedule a visit for your Pap smear.  Start monitoring your blood pressure daily, around the same time of day, for the next 1 week.  Ensure that you have rested for 30 minutes prior to checking your blood pressure.   Send me blood pressure readings via MyChart in 1 week.  It was a pleasure to see you today!

## 2024-01-03 NOTE — Progress Notes (Signed)
 Subjective:    Patient ID: [name], female    DOB: 1981/08/26, 43 y.o.   MRN: [id number]  HPI  [name] is a very pleasant 43 y.o. female who presents today for complete physical and follow up of chronic conditions.  Immunizations: -Tetanus: Completed in 2023 -Influenza: Completed this season   Diet: Fair diet.  Exercise: No regular exercise.  Eye exam: Completes annually  Dental exam: Completes semi-annually    Pap Smear: Completed > 3 years ago, declines today Mammogram: Never completed   BP Readings from Last 3 Encounters:  01/03/24 138/88  01/03/24 (!) 158/84  08/24/23 (!) 144/80       Review of Systems  Constitutional:  Negative for unexpected weight change.  HENT:  Negative for rhinorrhea.   Respiratory:  Negative for cough and shortness of breath.   Cardiovascular:  Negative for chest pain.  Gastrointestinal:  Negative for constipation and diarrhea.  Genitourinary:  Negative for difficulty urinating and menstrual problem.  Musculoskeletal:  Positive for arthralgias.  Skin:  Negative for rash.  Allergic/Immunologic: Negative for environmental allergies.  Neurological:  Negative for dizziness, numbness and headaches.  Psychiatric/Behavioral:  The patient is not nervous/anxious.          Past Medical History:  Diagnosis Date   Abscess of left Bartholin's gland    Diabetes mellitus    Hypercholesteremia    Hypertension    Pancreatitis    Pneumonia due to COVID-19 virus 09/02/2020    Social History   Socioeconomic History   Marital status: Single    Spouse name: Not on file   Number of children: Not on file   Years of education: Not on file   Highest education level: Associate degree: academic program  Occupational History   Not on file  Tobacco Use   Smoking status: Former   Smokeless tobacco: Never  Vaping Use   Vaping status: Never Used  Substance and Sexual Activity   Alcohol use: No   Drug use: No   Sexual activity:  Never  Other Topics Concern   Not on file  Social History Narrative   Not on file   Social Drivers of Health   Financial Resource Strain: Low Risk  (01/03/2024)   Overall Financial Resource Strain (CARDIA)    Difficulty of Paying Living Expenses: Not very hard  Food Insecurity: No Food Insecurity (01/03/2024)   Hunger Vital Sign    Worried About Running Out of Food in the Last Year: Never true    Ran Out of Food in the Last Year: Never true  Transportation Needs: No Transportation Needs (01/03/2024)   PRAPARE - Administrator, Civil Service (Medical): No    Lack of Transportation (Non-Medical): No  Physical Activity: Insufficiently Active (01/03/2024)   Exercise Vital Sign    Days of Exercise per Week: 2 days    Minutes of Exercise per Session: 20 min  Stress: No Stress Concern Present (01/03/2024)   [employer] of Occupational Health - Occupational Stress Questionnaire    Feeling of Stress : Only a little  Social Connections: Socially Isolated (01/03/2024)   Social Connection and Isolation Panel [NHANES]    Frequency of Communication with Friends and Family: Never    Frequency of Social Gatherings with Friends and Family: Once a week    Attends Religious Services: Never    Database Administrator or Organizations: No    Attends Banker Meetings: Not on file  Marital Status: Living with partner  Intimate Partner Violence: Not on file    Past Surgical History:  Procedure Laterality Date   KNEE SURGERY      Family History  Problem Relation Age of Onset   Cancer Other    Diabetes Other    Hypertension Other    Cancer Paternal Grandfather    Depression Mother    Diabetes Father    Hypertension Father    Depression Brother     No Known Allergies  Current Outpatient Medications on File Prior to Visit  Medication Sig Dispense Refill   atorvastatin  (LIPITOR) 40 MG tablet Take 1 tablet (40 mg total) by mouth daily. 90 tablet 3   Dulaglutide   (TRULICITY ) 3 MG/0.5ML SOPN Inject 3 mg as directed once a week. for diabetes. 6 mL 1   fenofibrate  160 MG tablet Take 1 tablet (160 mg total) by mouth daily. 90 tablet 3   FLUoxetine  (PROZAC ) 20 MG tablet Take 1 tablet (20 mg total) by mouth daily. for anxiety and depression. Take with 40 mg dose. 90 tablet 3   FLUoxetine  (PROZAC ) 40 MG capsule Take 1 capsule (40 mg total) by mouth daily. for anxiety and depression. 90 capsule 1   icosapent  Ethyl (VASCEPA ) 1 g capsule Take 2 capsules (2 g total) by mouth 2 (two) times daily. 360 capsule 3   insulin  glargine (LANTUS ) 100 UNIT/ML Solostar Pen Inject 50 Units into the skin 2 (two) times daily. 15 mL 5   insulin  regular (NOVOLIN R) 100 units/mL injection Inject 0.28 mLs (28 Units total) into the skin 3 (three) times daily before meals. 10 mL 5   lisinopril -hydrochlorothiazide  (ZESTORETIC ) 20-25 MG tablet Take 1 tablet by mouth daily. for blood pressure. 90 tablet 0   lisinopril -hydrochlorothiazide  (ZESTORETIC ) 20-25 MG tablet Take 1 tablet by mouth daily. 90 tablet 3   metFORMIN  (GLUCOPHAGE -XR) 500 MG 24 hr tablet Take 2 tablets (1,000 mg total) by mouth 2 (two) times daily with a meal. for diabetes. 360 tablet 0   No current facility-administered medications on file prior to visit.    BP 138/88   Pulse 80   Temp (!) 97.2 F (36.2 C) (Temporal)   Ht 5' 4 (1.626 m)   Wt 212 lb (96.2 kg)   LMP 12/25/2023   SpO2 98%   BMI 36.39 kg/m  Objective:   Physical Exam HENT:     Right Ear: Tympanic membrane and ear canal normal.     Left Ear: Tympanic membrane and ear canal normal.  Eyes:     Pupils: Pupils are equal, round, and reactive to light.  Cardiovascular:     Rate and Rhythm: Normal rate and regular rhythm.  Pulmonary:     Effort: Pulmonary effort is normal.     Breath sounds: Normal breath sounds.  Abdominal:     General: Bowel sounds are normal.     Palpations: Abdomen is soft.     Tenderness: There is no abdominal tenderness.   Musculoskeletal:        General: Normal range of motion.     Cervical back: Neck supple.  Skin:    General: Skin is warm and dry.  Neurological:     Mental Status: She is alert and oriented to person, place, and time.     Cranial Nerves: No cranial nerve deficit.     Deep Tendon Reflexes:     Reflex Scores:      Patellar reflexes are 2+ on the right side and 2+  on the left side. Psychiatric:        Mood and Affect: Mood normal.           Assessment & Plan:  Preventative health care Assessment & Plan: Immunizations UTD. Pap smear due, she declines but will reschedule. Mammogram due, orders placed.   Discussed the importance of a healthy diet and regular exercise in order for weight loss, and to reduce the risk of further co-morbidity.  Exam stable. Labs pending.  Follow up in 1 year for repeat physical.    Screening mammogram for breast cancer -     3D Screening Mammogram, Left and Right; Future  Type 2 diabetes mellitus with hyperglycemia, with long-term current use of insulin  (HCC) Assessment & Plan: Glucose readings are in range >80% of time! Commended her on this!  Continue Lantus  50 units daily, Trulicity  3 mg weekly, metformin  ER 1000 mg BID, Novolin R 15-20 units TID with meals.  A1C pending today. Discussed to schedule eye exam.  Follow up in 3-6 months based on A1C test.  Orders: -     Hemoglobin A1c  Adjustment disorder with mixed anxiety and depressed mood Assessment & Plan: Controlled.  Continue fluoxetine  60 mg daily.   Hypertriglyceridemia Assessment & Plan: Following with lipid clinic.  Continue atorvastatin  40 mg daily, Vascepa  2g BID, fenofibrate  160 mg daily. Repeat lipid panel pending.   Orders: -     Lipid panel -     Comprehensive metabolic panel  Essential hypertension Assessment & Plan: Above goal in the office today, improved upon recheck but borderline high.  Will have her start monitoring BP at work for next 1  week.  She will report with readings.  Continue lisinopril -hydrochlorothiazide  20-25 milligrams daily. Consider addition of amlodipine 5 mg daily.  Await blood pressure readings   Vitamin D  deficiency -     VITAMIN D  25 Hydroxy (Vit-D Deficiency, Fractures)        Dekker Verga K Marilouise Densmore, NP

## 2024-01-03 NOTE — Patient Instructions (Addendum)
 Thank you for coming in today.  Please get an Xray today before you leave  Call or go to the ER if you develop a large red swollen joint with extreme pain or oozing puss.    Continue the brace as needed.   If not better next step is MRI.  Let me know.

## 2024-01-03 NOTE — Assessment & Plan Note (Signed)
 Above goal in the office today, improved upon recheck but borderline high.  Will have her start monitoring BP at work for next 1 week.  She will report with readings.  Continue lisinopril -hydrochlorothiazide  20-25 milligrams daily. Consider addition of amlodipine 5 mg daily.  Await blood pressure readings

## 2024-01-03 NOTE — Assessment & Plan Note (Signed)
 Immunizations UTD. Pap smear due, she declines but will reschedule. Mammogram due, orders placed.   Discussed the importance of a healthy diet and regular exercise in order for weight loss, and to reduce the risk of further co-morbidity.  Exam stable. Labs pending.  Follow up in 1 year for repeat physical.

## 2024-01-16 ENCOUNTER — Ambulatory Visit: Payer: No Typology Code available for payment source | Admitting: Family Medicine

## 2024-01-18 ENCOUNTER — Encounter: Payer: Self-pay | Admitting: Family Medicine

## 2024-01-18 NOTE — Progress Notes (Signed)
Right knee x-ray shows some swelling.  A little bit of arthritis type changes are present.  We can see where you had surgery as well.  If not better after the cortisone shot the next step should probably be an MRI.

## 2024-02-07 ENCOUNTER — Encounter: Payer: Self-pay | Admitting: Registered Nurse

## 2024-02-07 ENCOUNTER — Telehealth: Payer: Self-pay | Admitting: Registered Nurse

## 2024-02-07 DIAGNOSIS — J101 Influenza due to other identified influenza virus with other respiratory manifestations: Secondary | ICD-10-CM

## 2024-02-07 NOTE — Telephone Encounter (Signed)
 Patient reported symptoms started last Thursday 02/01/2024 was seen at urgent care 3/10 and diagnosed with flu. Reviewed care everywhere negative covid rapid testing and positive for influenza A;  influenza B negative. RTW today still has some chest congestion saw clinic nurse today and requested appt with NP for re-evaluation tomorrow.  Stated was able to perform duties at work today.  Appt scheduled for 11 tomorrow with NP.  Patient with some congestion audible no cough during 5 minute call; some audible nasal sniffing.  Patient A&Ox3 agreed with plan of care and had no further questions at this time.  HR and supevisor notified falls under employer communicable disease policy excused absence 3/10-09/2024.  Recommend wearing mask if still having symptoms at work/when around others as may still be shedding virus/can infect others.

## 2024-02-08 ENCOUNTER — Encounter: Payer: Self-pay | Admitting: Registered Nurse

## 2024-02-08 ENCOUNTER — Ambulatory Visit: Payer: Self-pay | Admitting: Registered Nurse

## 2024-02-08 VITALS — HR 68 | Temp 98.2°F | Resp 16

## 2024-02-08 DIAGNOSIS — J069 Acute upper respiratory infection, unspecified: Secondary | ICD-10-CM

## 2024-02-08 DIAGNOSIS — J101 Influenza due to other identified influenza virus with other respiratory manifestations: Secondary | ICD-10-CM

## 2024-02-08 MED ORDER — ALBUTEROL SULFATE HFA 108 (90 BASE) MCG/ACT IN AERS: 2.0000 | INHALATION_SPRAY | Freq: Four times a day (QID) | RESPIRATORY_TRACT | 0 refills | Status: DC | PRN

## 2024-02-08 MED ORDER — BENZONATATE 200 MG PO CAPS: 200.0000 mg | ORAL_CAPSULE | Freq: Three times a day (TID) | ORAL | 1 refills | Status: AC | PRN

## 2024-02-08 MED ORDER — SALINE SPRAY 0.65 % NA SOLN: 2.0000 | NASAL | Status: DC

## 2024-02-08 MED ORDER — LORATADINE 10 MG PO TABS: 10.0000 mg | ORAL_TABLET | Freq: Every day | ORAL | 11 refills | Status: AC

## 2024-02-08 NOTE — Patient Instructions (Addendum)
 Albuterol Metered Dose Inhaler (MDI) What is this medication? ALBUTEROL (al Gaspar Bidding) treats lung diseases, such as asthma, where the airways in the lungs narrow, causing breathing problems or wheezing (bronchospasm). It is also used to treat asthma or prevent breathing problems during exercise. It works by opening the airways of the lungs, making it easier to breathe. It is often called a rescue or quick-relief medication. This medicine may be used for other purposes; ask your health care provider or pharmacist if you have questions. COMMON BRAND NAME(S): Proair HFA, Proventil, Proventil HFA, Respirol, Ventolin, Ventolin HFA What should I tell my care team before I take this medication? They need to know if you have any of these conditions: Diabetes Heart disease High blood pressure Irregular heartbeat or rhythm Pheochromocytoma Seizures Thyroid disease An unusual or allergic reaction to albuterol, other medications, foods, dyes, or preservatives Pregnant or trying to get pregnant Breastfeeding How should I use this medication? This medication is inhaled through the mouth. Take it as directed on the prescription label. Do not use it more often than directed. This medication comes with INSTRUCTIONS FOR USE. Ask your pharmacist for directions on how to use this medication. Read the information carefully. Talk to your pharmacist or care team if you have questions. Talk to your care team about the use of this medication in children. While it may be given to children for selected conditions, precautions do apply. Overdosage: If you think you have taken too much of this medicine contact a poison control center or emergency room at once. NOTE: This medicine is only for you. Do not share this medicine with others. What if I miss a dose? If you take this medication on a regular basis, take it as soon as you can. If it is almost time for your next dose, take only that dose. Do not take double or  extra doses. What may interact with this medication? Certain medications for blood pressure, heart disease, irregular heartbeat Certain medications for depression, anxiety, or other mental health conditions Diuretics MAOIs, such as Marplan, Nardil, and Parnate This list may not describe all possible interactions. Give your health care provider a list of all the medicines, herbs, non-prescription drugs, or dietary supplements you use. Also tell them if you smoke, drink alcohol, or use illegal drugs. Some items may interact with your medicine. What should I watch for while using this medication? Visit your care team for regular checks on your progress. Tell your care team if your symptoms do not start to get better or if they get worse. If your symptoms get worse or if you are using this medication more than normal, call your care team right away. You and your care team should develop an Asthma Action Plan that is just for you. Be sure to know what to do if you are in the yellow (asthma is getting worse) or red (medical alert) zones. Your mouth may get dry. Chewing sugarless gum or sucking hard candy and drinking plenty of water may help. Contact your care team if the problem does not go away or is severe. What side effects may I notice from receiving this medication? Side effects that you should report to your care team as soon as possible: Allergic reactions--skin rash, itching, hives, swelling of the face, lips, tongue, or throat Heart rhythm changes--fast or irregular heartbeat, dizziness, feeling faint or lightheaded, chest pain, trouble breathing Increase in blood pressure Muscle pain or cramps Wheezing or trouble breathing that is worse after  use Side effects that usually do not require medical attention (report to your care team if they continue or are bothersome): Change in taste Dry mouth Headache Sore throat Tremors or shaking Trouble sleeping This list may not describe all  possible side effects. Call your doctor for medical advice about side effects. You may report side effects to FDA at 1-800-FDA-1088. Where should I keep my medication? Keep out of the reach of children and pets. Store at room temperature between 20 and 25 degrees C (68 and 77 degrees F). Keep inhaler away from extreme heat and cold. Get rid of it when the dose counter reads 0 or after the expiration date, whichever is first. To get rid of medications that are no longer needed or have expired: Take the medication to a medication take-back program. Check with your pharmacy or law enforcement to find a location. If you cannot return the medication, ask your care team how to get rid of this medication safely. NOTE: This sheet is a summary. It may not cover all possible information. If you have questions about this medicine, talk to your doctor, pharmacist, or health care provider.  2024 Elsevier/Gold Standard (2022-06-06 00:00:00)How to Perform a Sinus Rinse A sinus rinse is a home treatment that is used to rinse your sinuses with a germ-free (sterile) mixture of salt and water (saline solution). Sinuses are air-filled spaces in your skull that are behind the bones of your face and forehead. They open into your nasal cavity. A sinus rinse can help to clear mucus, dirt, dust, or pollen from your nasal cavity. You may do a sinus rinse when you have a cold, a virus, nasal allergy symptoms, a sinus infection, or stuffiness in your nose or sinuses. What are the risks? A sinus rinse is generally safe and effective. However, there are a few risks, which include: A burning sensation in your sinuses. This may happen if you do not make the saline solution as directed. Be sure to follow all directions when making the saline solution. Nasal irritation. Infection. This may be from unclean supplies or from contaminated water. Infection from contaminated water is rare, but possible. Do not do a sinus rinse if you have  had ear or nasal surgery, ear infection, or plugged ears, unless recommended by your health care provider. Supplies needed: Saline solution or powder. Distilled or sterile water to mix with saline powder. You may use boiled and cooled tap water. Boil tap water for 5 minutes; cool until it is lukewarm. Use within 24 hours. Do not use regular tap water to mix with the saline solution. Neti pot or nasal rinse bottle. These supplies release the saline solution into your nose and through your sinuses. Neti pots and nasal rinse bottles can be purchased at [employer], a health food store, or online. How to perform a sinus rinse  Wash your hands with soap and water for at least 20 seconds. If soap and water are not available, use hand sanitizer. Wash your device according to the directions that came with the product and then dry it. Use the solution that comes with your product or one that is sold separately in stores. Follow the mixing directions on the package to mix with sterile or distilled water. Fill the device with the amount of saline solution noted in the device instructions. Stand by a sink and tilt your head sideways over the sink. Place the spout of the device in your upper nostril (the one closer to the  ceiling). Gently pour or squeeze the saline solution into your nasal cavity. The liquid should drain out from the lower nostril if you are not too congested. While rinsing, breathe through your open mouth. Gently blow your nose to clear any mucus and rinse solution. Blowing too hard may cause ear pain. Turn your head in the other direction and repeat in your other nostril. Clean and rinse your device with clean water and then air-dry it. Talk with your health care provider or pharmacist if you have questions about how to do a sinus rinse. Summary A sinus rinse is a home treatment that is used to rinse your sinuses with a sterile mixture of salt and water (saline solution). You may  do a sinus rinse when you have a cold, a virus, nasal allergy symptoms, a sinus infection, or stuffiness in your nose or sinuses. A sinus rinse is generally safe and effective. Follow all instructions carefully. This information is not intended to replace advice given to you by your health care provider. Make sure you discuss any questions you have with your health care provider. Document Revised: 05/03/2021 Document Reviewed: 05/03/2021 Elsevier Patient Education  2024 Elsevier Inc.Cough, Adult Coughing is a reflex that clears your throat and airways (respiratory system). It helps heal and protect your lungs. It is normal to cough from time to time. A cough that happens with other symptoms or that lasts a long time may be a sign of a condition that needs treatment. A short-term (acute) cough may only last 2-3 weeks. A long-term (chronic) cough may last 8 or more weeks. Coughing is often caused by: Diseases, such as: An infection of the respiratory system. Asthma or other heart or lung diseases. Gastroesophageal reflux. This is when acid comes back up from the stomach. Breathing in things that irritate your lungs. Allergies. Postnasal drip. This is when mucus runs down the back of your throat. Smoking. Some medicines. Follow these instructions at home: Medicines Take over-the-counter and prescription medicines only as told by your health care provider. Talk with your provider before you take cough medicine (cough suppressants). Eating and drinking Do not drink alcohol. Avoid caffeine. Drink enough fluid to keep your pee (urine) pale yellow. Lifestyle Avoid cigarette smoke. Do not use any products that contain nicotine or tobacco. These products include cigarettes, chewing tobacco, and vaping devices, such as e-cigarettes. If you need help quitting, ask your provider. Avoid things that make you cough. These may include perfumes, candles, cleaning products, or campfire smoke. General  instructions  Watch for any changes to your cough. Tell your provider about them. Always cover your mouth when you cough. If the air is dry in your bedroom or home, use a cool mist vaporizer or humidifier. If your cough is worse at night, try to sleep in a semi-upright position. Rest as needed. Contact a health care provider if: You have new symptoms, or your symptoms get worse. You cough up pus. You have a fever that does not go away or a cough that does not get better after 2-3 weeks. You cannot control your cough with medicine, and you are losing sleep. You have pain that gets worse or is not helped with medicine. You lose weight for no clear reason. You have night sweats. Get help right away if: You cough up blood. You have trouble breathing. Your heart is beating very fast. These symptoms may be an emergency. Get help right away. Call 911. Do not wait to see if the symptoms will  go away. Do not drive yourself to the hospital. This information is not intended to replace advice given to you by your health care provider. Make sure you discuss any questions you have with your health care provider. Document Revised: 07/15/2022 Document Reviewed: 07/15/2022 Elsevier Patient Education  2024 [employer].

## 2024-02-08 NOTE — Progress Notes (Signed)
 Subjective:    Patient ID: [name], female    DOB: 07-Dec-1980, 43 y.o.   MRN: [id number]  43y/o caucasian female established patient here for evaluation chest congestion, cough, sore throat s/p influenza A diagnosed at [facility] 05 Feb 2024.  Was given promethazine DM uses at home but today first day back at work and makes her drowsy so unable to use.  Denied n/v/d/f/c but does have protracted coughing at times.  Has OTC cough drops with her to use prn at work.  Tolerating po intake without difficulty.  Notices wheezing sometimes.  Mucous yellow sometimes chunky worst typically when lying down to sleep.      Review of Systems  Constitutional:  Positive for fatigue. Negative for chills, diaphoresis and fever.  HENT:  Positive for congestion, postnasal drip and sore throat. Negative for drooling, ear discharge, ear pain, facial swelling, hearing loss, mouth sores, nosebleeds, rhinorrhea, sinus pressure, sinus pain, sneezing, trouble swallowing and voice change.   Eyes:  Negative for photophobia, discharge, redness and visual disturbance.  Respiratory:  Positive for cough and wheezing. Negative for shortness of breath and stridor.   Cardiovascular:  Negative for chest pain and palpitations.  Gastrointestinal:  Negative for abdominal pain, diarrhea, nausea and vomiting.  Genitourinary:  Negative for difficulty urinating.  Musculoskeletal:  Negative for back pain, gait problem, neck pain and neck stiffness.  Skin:  Negative for rash.  Neurological:  Negative for tremors, seizures, syncope, facial asymmetry, speech difficulty, weakness, light-headedness, numbness and headaches.  Hematological:  Negative for adenopathy. Does not bruise/bleed easily.  Psychiatric/Behavioral:  Positive for sleep disturbance. Negative for agitation and confusion.        Objective:   Physical Exam Vitals and nursing note reviewed.  Constitutional:      General: She is awake. She is not in acute distress.     Appearance: She is well-developed. She is obese. She is ill-appearing. She is not toxic-appearing or diaphoretic.  HENT:     Head: Normocephalic and atraumatic.     Jaw: There is normal jaw occlusion.     Salivary Glands: Right salivary gland is not diffusely enlarged or tender. Left salivary gland is not diffusely enlarged or tender.     Right Ear: Hearing, ear canal and external ear normal. No decreased hearing noted. No laceration, drainage, swelling or tenderness. A middle ear effusion is present. There is no impacted cerumen. No foreign body. No mastoid tenderness. No PE tube. No hemotympanum. Tympanic membrane is bulging. Tympanic membrane is not injected, scarred, perforated, erythematous or retracted.     Left Ear: Hearing, ear canal and external ear normal. No decreased hearing noted. No laceration, drainage, swelling or tenderness. A middle ear effusion is present. There is no impacted cerumen. No foreign body. No mastoid tenderness. No PE tube. No hemotympanum. Tympanic membrane is bulging. Tympanic membrane is not injected, scarred, perforated, erythematous or retracted.     Ears:     Comments: Bilateral TMs intact air fluid level clear; no debris noted in auditory canals    Nose: Mucosal edema, congestion and rhinorrhea present. No laceration. Rhinorrhea is clear.     Right Nostril: No epistaxis.     Left Nostril: No epistaxis.     Right Turbinates: Enlarged and swollen. Not pale.     Left Turbinates: Enlarged and swollen. Not pale.     Right Sinus: No maxillary sinus tenderness or frontal sinus tenderness.     Left Sinus: No maxillary sinus tenderness or frontal  sinus tenderness.     Comments: Nasal turbinates edema erythema clear discharge; bilateral allergic shiners; cobblestoning posterior pharynx; tonsils 1+/4 bilaterally    Mouth/Throat:     Lips: Pink. No lesions.     Mouth: Mucous membranes are moist. No oral lesions or angioedema.     Dentition: No gum lesions.     Tongue:  No lesions. Tongue does not deviate from midline.     Palate: No mass and lesions.     Pharynx: Uvula midline. Pharyngeal swelling, posterior oropharyngeal erythema and postnasal drip present. No oropharyngeal exudate or uvula swelling.     Tonsils: No tonsillar exudate or tonsillar abscesses. 1+ on the right. 1+ on the left.  Eyes:     General: Lids are normal. Vision grossly intact. Gaze aligned appropriately. Allergic shiner present. No scleral icterus.       Right eye: No discharge.        Left eye: No discharge.     Extraocular Movements: Extraocular movements intact.     Conjunctiva/sclera: Conjunctivae normal.     Pupils: Pupils are equal, round, and reactive to light.  Neck:     Trachea: Trachea and phonation normal.  Cardiovascular:     Rate and Rhythm: Normal rate and regular rhythm.     Pulses: Normal pulses.          Radial pulses are 2+ on the right side and 2+ on the left side.     Heart sounds: Normal heart sounds, S1 normal and S2 normal.  Pulmonary:     Effort: Pulmonary effort is normal. No respiratory distress.     Breath sounds: Normal breath sounds. No stridor, decreased air movement or transmitted upper airway sounds. No decreased breath sounds, wheezing, rhonchi or rales.     Comments: Audible nasal congestion and voice husky; nasal sniffing and throat clearing observed in exam room; BBS CTA respirations even and unlabored RA spoke full sentences without difficulty Abdominal:     Palpations: Abdomen is soft.  Musculoskeletal:        General: Normal range of motion.     Right hand: Normal strength. Normal capillary refill.     Left hand: Normal strength. Normal capillary refill.     Cervical back: Normal range of motion and neck supple. No swelling, edema, deformity, erythema, signs of trauma, lacerations, rigidity, spasms, torticollis, tenderness or crepitus. No pain with movement. Normal range of motion.     Thoracic back: No swelling, edema, deformity, signs of  trauma, lacerations, spasms or tenderness. Normal range of motion.     Right lower leg: No edema.     Left lower leg: No edema.  Lymphadenopathy:     Head:     Right side of head: No submental, submandibular, tonsillar, preauricular, posterior auricular or occipital adenopathy.     Left side of head: No submental, submandibular, tonsillar, preauricular, posterior auricular or occipital adenopathy.     Cervical: No cervical adenopathy.     Right cervical: No superficial, deep or posterior cervical adenopathy.    Left cervical: No superficial, deep or posterior cervical adenopathy.  Skin:    General: Skin is warm and dry.     Capillary Refill: Capillary refill takes less than 2 seconds.     Coloration: Skin is not ashen, cyanotic, jaundiced, mottled, pale or sallow.     Findings: No abrasion, abscess, acne, bruising, burn, ecchymosis, erythema, signs of injury, laceration, lesion, petechiae, rash or wound.     Nails: There is no  clubbing.     Comments: Face/neck and hands visually inspected  Neurological:     General: No focal deficit present.     Mental Status: She is alert and oriented to person, place, and time. Mental status is at baseline.     GCS: GCS eye subscore is 4. GCS verbal subscore is 5. GCS motor subscore is 6.     Cranial Nerves: No cranial nerve deficit, dysarthria or facial asymmetry.     Sensory: Sensation is intact. No sensory deficit.     Motor: Motor function is intact. No weakness, tremor, atrophy, abnormal muscle tone or seizure activity.     Coordination: Coordination is intact. Coordination normal.     Gait: Gait normal.     Comments: In/out of chair without difficulty; gait sure and steady; bilateral hand grasp equal 5/5  Psychiatric:        Attention and Perception: Attention and perception normal.        Mood and Affect: Mood and affect normal.        Speech: Speech normal.        Behavior: Behavior normal. Behavior is cooperative.        Thought Content:  Thought content normal.        Cognition and Memory: Cognition and memory normal.        Judgment: Judgment normal.           Assessment & Plan:   A-viral URI with cough, influenza A subsequent visit  P-Patient may use normal saline nasal spray 2 sprays each nostril q2h wa as needed given 1 bottle from clinic stock. flonase 1 spray each nostril BID otc prn post nasal drip/congestion  Given 3 UD claritin from clinic stock for trial.  Patient denied personal or family history of ENT cancer.  OTC antihistamine of choice claritin/zyrtec 10mg  po daily.  Avoid triggers if possible.  Shower prior to bedtime if exposed to triggers.  Albuterol inhaler 1-2 puffs q4-6h prn protracted coughing, chest tightness or wheezing #1 RF0 discussed possible side effect increased heart rate and hand tremors, tessalon pearles 200mg  po TID prn cough #30 RF1 discussed nondrowsy may use at work along with albuterol inhaler for cough and OTC cough drops;  may continue promethazine DM at home discussed do not recommend use at work or before driving due to drowsiness after administration.  Exitcare handout cough, sinus rinse, albuterol MDI Call or return to clinic as needed if these symptoms worsen or fail to improve as anticipated.  Discussed congestion may be combination flu and allergies at this time.  Tree pollen high and grass rising with warmer temperatures this week.  Shower prior to bedtime/after work.  Discussed viral URI mucous clear to yellow to green then done.  Discussed sp02 stable and BBS CTA.  Sore throat most likely from post nasal drip and flu virus itself.  May use salt water gargles prn.   Patient verbalized understanding of instructions, agreed with plan of care and had no further questions at this time.  P2:  Avoidance and hand washing.

## 2024-02-15 ENCOUNTER — Other Ambulatory Visit: Payer: Self-pay | Admitting: Primary Care

## 2024-02-15 DIAGNOSIS — E1165 Type 2 diabetes mellitus with hyperglycemia: Secondary | ICD-10-CM

## 2024-04-08 ENCOUNTER — Encounter: Payer: Self-pay | Admitting: *Deleted

## 2024-06-01 ENCOUNTER — Ambulatory Visit (HOSPITAL_COMMUNITY)
Admission: EM | Admit: 2024-06-01 | Discharge: 2024-06-01 | Disposition: A | Discharge status: Home / Self Care | Attending: Emergency Medicine | Admitting: Emergency Medicine

## 2024-06-01 ENCOUNTER — Ambulatory Visit (INDEPENDENT_AMBULATORY_CARE_PROVIDER_SITE_OTHER)

## 2024-06-01 ENCOUNTER — Encounter (HOSPITAL_COMMUNITY): Payer: Self-pay

## 2024-06-01 DIAGNOSIS — M25511 Pain in right shoulder: Secondary | ICD-10-CM | POA: Diagnosis not present

## 2024-06-01 DIAGNOSIS — S46911A Strain of unspecified muscle, fascia and tendon at shoulder and upper arm level, right arm, initial encounter: Secondary | ICD-10-CM | POA: Diagnosis not present

## 2024-06-01 NOTE — ED Provider Notes (Signed)
 [facility]    CSN: [id number] Arrival date & time: 06/01/24  1248      History   Chief Complaint Chief Complaint  Patient presents with   Shoulder Pain    HPI [name] is a 43 y.o. female.   Patient presents with right shoulder pain that began yesterday.  Patient states that she was putting some walls yesterday and then began having some anterior shoulder pain throughout the day.  Patient states that it has worsened since yesterday and now she has difficulty moving her shoulder due to the pain.  Patient denies any recent falls or known injuries.  Patient denies any history of injury to this shoulder.  Patient denies taking any medication for her pain.  The history is provided by the patient and medical records.  Shoulder Pain   Past Medical History:  Diagnosis Date   Abscess of left Bartholin's gland    Diabetes mellitus    Hypercholesteremia    Hypertension    Pancreatitis    Pneumonia due to COVID-19 virus 09/02/2020    Patient Active Problem List   Diagnosis Date Noted   Preventative health care 01/03/2024   History of vitamin D  deficiency 06/17/2023   Screening examination for STD (sexually transmitted disease) 08/09/2022   Exposure to hepatitis C 08/09/2022   Adjustment disorder with mixed anxiety and depressed mood 08/13/2019   Fatigue 07/11/2019   Neuropathy 04/25/2019   Type 2 diabetes mellitus with hyperglycemia (HCC) 10/21/2014   History of pancreatitis 10/21/2014   Essential hypertension 10/21/2014   Hypertriglyceridemia 10/21/2014    Past Surgical History:  Procedure Laterality Date   KNEE SURGERY      OB History   No obstetric history on file.      Home Medications    Prior to Admission medications   Medication Sig Start Date End Date Taking? Authorizing Provider  atorvastatin  (LIPITOR) 40 MG tablet Take 1 tablet (40 mg total) by mouth daily. 03/14/23  Yes Betancourt, Ellouise LABOR, NP  Dulaglutide  (TRULICITY ) 3 MG/0.5ML SOAJ  INJECT 3 MG AS DIRECTED ONCE A WEEK FOR  DIABETES 02/15/24  Yes Clark, Katherine K, NP  fenofibrate  160 MG tablet Take 1 tablet (160 mg total) by mouth daily. 07/25/23  Yes Hilty, Vinie BROCKS, MD  FLUoxetine  (PROZAC ) 20 MG tablet Take 1 tablet (20 mg total) by mouth daily. for anxiety and depression. Take with 40 mg dose. 08/24/23  Yes Gretta Comer POUR, NP  FLUoxetine  (PROZAC ) 40 MG capsule Take 1 capsule (40 mg total) by mouth daily. for anxiety and depression. 05/04/23  Yes Clark, Katherine K, NP  icosapent  Ethyl (VASCEPA ) 1 g capsule Take 2 capsules (2 g total) by mouth 2 (two) times daily. 07/25/23  Yes Hilty, Vinie BROCKS, MD  insulin  glargine (LANTUS ) 100 UNIT/ML Solostar Pen Inject 50 Units into the skin 2 (two) times daily. 09/10/20  Yes [place], Camellia PARAS, DO  insulin  regular (NOVOLIN R) 100 units/mL injection Inject 0.28 mLs (28 Units total) into the skin 3 (three) times daily before meals. 08/13/19  Yes Clark, Katherine K, NP  lisinopril -hydrochlorothiazide  (ZESTORETIC ) 20-25 MG tablet Take 1 tablet by mouth daily. for blood pressure. 03/07/23  Yes Clark, Katherine K, NP  lisinopril -hydrochlorothiazide  (ZESTORETIC ) 20-25 MG tablet Take 1 tablet by mouth daily. 03/14/23  Yes Betancourt, Ellouise LABOR, NP  loratadine  (CLARITIN ) 10 MG tablet Take 1 tablet (10 mg total) by mouth daily. 02/08/24  Yes Betancourt, Ellouise LABOR, NP  metFORMIN  (GLUCOPHAGE -XR) 500 MG 24 hr tablet  Take 2 tablets (1,000 mg total) by mouth 2 (two) times daily with a meal. for diabetes. 06/13/23  Yes Gretta Comer POUR, NP    Family History Family History  Problem Relation Age of Onset   Cancer Other    Diabetes Other    Hypertension Other    Cancer Paternal Grandfather    Depression Mother    Diabetes Father    Hypertension Father    Depression Brother     Social History Social History   Tobacco Use   Smoking status: Former   Smokeless tobacco: Never  Advertising account planner   Vaping status: Never Used  Substance Use Topics   Alcohol use: No    Drug use: No     Allergies   Patient has no known allergies.   Review of Systems Review of Systems  Per HPI  Physical Exam Triage Vital Signs ED Triage Vitals  Encounter Vitals Group     BP 06/01/24 1339 (!) 156/100     Girls Systolic BP Percentile --      Girls Diastolic BP Percentile --      Boys Systolic BP Percentile --      Boys Diastolic BP Percentile --      Pulse Rate 06/01/24 1339 (!) 112     Resp 06/01/24 1339 18     Temp 06/01/24 1339 98.5 F (36.9 C)     Temp Source 06/01/24 1339 Oral     SpO2 06/01/24 1339 94 %     Weight --      Height 06/01/24 1339 5' 4 (1.626 m)     Head Circumference --      Peak Flow --      Pain Score 06/01/24 1336 6     Pain Loc --      Pain Education --      Exclude from Growth Chart --    No data found.  Updated Vital Signs BP (!) 159/86   Pulse (!) 114   Temp 98.5 F (36.9 C) (Oral)   Resp 14   Ht 5' 4 (1.626 m)   LMP 05/20/2024 (Approximate)   SpO2 93%   BMI 36.39 kg/m   Visual Acuity Right Eye Distance:   Left Eye Distance:   Bilateral Distance:    Right Eye Near:   Left Eye Near:    Bilateral Near:     Physical Exam Vitals and nursing note reviewed.  Constitutional:      General: She is awake. She is not in acute distress.    Appearance: Normal appearance. She is well-developed and well-groomed. She is not ill-appearing.  Musculoskeletal:     Right shoulder: Tenderness and bony tenderness present. No swelling or deformity. Decreased range of motion.     Comments: Tenderness noted to anterior shoulder.  Decreased range of motion due to pain.  Skin:    General: Skin is warm and dry.  Neurological:     Mental Status: She is alert.  Psychiatric:        Behavior: Behavior is cooperative.      UC Treatments / Results  Labs (all labs ordered are listed, but only abnormal results are displayed) Labs Reviewed - No data to display  EKG   Radiology DG Shoulder Right Result Date:  06/01/2024 CLINICAL DATA:  shoulder pain after painting yesterday, difficulty moving shoulder due to pain EXAM: RIGHT SHOULDER - 2+ VIEW COMPARISON:  None Available. FINDINGS: There is no evidence of fracture or dislocation. There is no  evidence of arthropathy or other focal bone abnormality. 2 cm min linear metallic density within the arm may represent an implantable medical device. IMPRESSION: 1. No acute displaced fracture or dislocation. 2. A 2 cm min linear metallic density within the arm may represent an implantable medical device versus retained radiopaque foreign body. Correlate clinically. Electronically Signed   By: Morgane  Naveau M.D.   On: 06/01/2024 15:24    Procedures Procedures (including critical care time)  Medications Ordered in UC Medications - No data to display  Initial Impression / Assessment and Plan / UC Course  I have reviewed the triage vital signs and the nursing notes.  Pertinent labs & imaging results that were available during my care of the patient were reviewed by me and considered in my medical decision making (see chart for details).     Patient is overall well-appearing.  Tenderness noted to anterior shoulder.  Decreased range of motion due to pain.  No other significant findings on exam.  X-ray ordered.  Based on my interpretation there is no acute fracture or dislocation.  Radiology report confirms this.  Recommended Tylenol  and ibuprofen as needed for pain.  Given orthopedic follow-up.  Discussed follow-up and return precautions. Final Clinical Impressions(s) / UC Diagnoses   Final diagnoses:  Acute pain of right shoulder  Strain of right shoulder, initial encounter     Discharge Instructions      Your x-ray is negative for any underlying fracture or dislocation. It is likely you have strained his shoulder. Alternate between 650 mg of Tylenol  and 400 to 600 mg of ibuprofen every 6-8 hours as needed for pain. Alternate between ice and heat as  needed for pain, and continue to do some gentle stretching to avoid becoming stiff. Follow-up with [facility] if your pain continues for further evaluation and management. Follow-up with your primary care provider or return here as needed.     ED Prescriptions   None    PDMP not reviewed this encounter.   Johnie Flaming A, NP 06/01/24 1540

## 2024-06-01 NOTE — Discharge Instructions (Addendum)
 Your x-ray is negative for any underlying fracture or dislocation. It is likely you have strained his shoulder. Alternate between 650 mg of Tylenol  and 400 to 600 mg of ibuprofen every 6-8 hours as needed for pain. Alternate between ice and heat as needed for pain, and continue to do some gentle stretching to avoid becoming stiff. Follow-up with [facility] if your pain continues for further evaluation and management. Follow-up with your primary care provider or return here as needed.

## 2024-06-01 NOTE — ED Triage Notes (Signed)
 Patient presenting with right shoulder pain onset yesterday. Patient states she was painting and then the pain started randomly then continued to get worse throughout the day. Denies any recent injures, denies any history of issues with this shoulder.   Prescriptions or OTC medications tried: No

## 2024-06-11 ENCOUNTER — Telehealth: Payer: Self-pay | Admitting: Registered Nurse

## 2024-06-11 DIAGNOSIS — Z0000 Encounter for general adult medical examination without abnormal findings: Secondary | ICD-10-CM

## 2024-06-12 ENCOUNTER — Encounter: Payer: Self-pay | Admitting: Registered Nurse

## 2024-06-12 NOTE — Telephone Encounter (Signed)
 Giovanna,  You had your labs done earlier this year but have not signed paperwork and completed alternative due to high Hgba1c and Blood pressure.  Please see Apolinar to sign your paperwork.  Sincerely,  Ellouise Hope NP-C

## 2024-06-27 NOTE — Telephone Encounter (Signed)
 Patient stated blood pressure and sugar not under control and does not want to complete alternatives at this time.  [employer] form completed and given to HR Jen alternatives not met; biometrics not satisfactory for insurance discount starting 28 Aug 2024.

## 2024-07-02 ENCOUNTER — Ambulatory Visit: Payer: No Typology Code available for payment source | Admitting: Primary Care

## 2024-08-20 ENCOUNTER — Ambulatory Visit: Admitting: General Practice

## 2024-08-20 DIAGNOSIS — R21 Rash and other nonspecific skin eruption: Secondary | ICD-10-CM | POA: Insufficient documentation

## 2024-08-20 NOTE — Progress Notes (Signed)
 Employee visited clinic today with itching and redness to the right forearm. Three sites counted, states not feeling febrile. Also states no animals sleep with her in the bed and she spends time outside after work. See NP note on OTC medicine provided with band-aids.

## 2024-08-21 ENCOUNTER — Telehealth: Payer: Self-pay | Admitting: Registered Nurse

## 2024-08-21 ENCOUNTER — Encounter: Payer: Self-pay | Admitting: Registered Nurse

## 2024-08-21 DIAGNOSIS — W57XXXA Bitten or stung by nonvenomous insect and other nonvenomous arthropods, initial encounter: Secondary | ICD-10-CM

## 2024-08-21 MED ORDER — HYDROCORTISONE 1 % EX LOTN: 1.0000 | TOPICAL_LOTION | Freq: Two times a day (BID) | CUTANEOUS | Status: AC

## 2024-08-21 MED ORDER — DIPHENHYDRAMINE HCL 2 % EX GEL: 1.0000 | Freq: Two times a day (BID) | CUTANEOUS | Status: AC | PRN

## 2024-08-21 MED ORDER — AQUAPHOR EX OINT
TOPICAL_OINTMENT | Freq: Two times a day (BID) | CUTANEOUS | Status: AC | PRN
Start: 2024-08-21 — End: 2024-09-11

## 2024-08-21 NOTE — Telephone Encounter (Signed)
 Patient reported woke up yesterday with itchy rash.  She has seen spiders in her bedroom and thinks she was bit by them in her sleep on arm.  She has been scratching/itchy and some bumps have arisen on right arm.  She denied fever/chills/headache/joint aches or purulent discharge.    She has tried showering.  Did not require oral steroids.  20 lesions papular with erythema largest number grouped at right elbow 15 dry some scabbed/abraded largest diameter less than 0.5cm smallest 3mm.  3 further lesion at wrist not grouped but in linear pattern and 2 on posterior forearm.  Given calagel 2% apply BID prn itching and hydrocortisone  1% cream topical BID prn itching.  Wash area daily with soap and water do not scrub allow soapy water to wash over area.  Avoid steam showers.  Use tepid water.    Avoid scratching.  May apply ice 15 minutes po TID prn pain/swelling.  Exitcare handout on insect bite. RTC if worsening erythema, pain, purulent discharge, fever. Wash towels, washcloths, sheets in hot water with bleach every couple of days until infection resolved. Wash area with soap and water at least daily.  Apply aquaphor/vaseline BID with dressing change.  Cover with bandage open wounds until healed over.  Given bandaids and aquaphor/calagel and hydrocortisone  UD from clinic stock.  Applied 4 bandaids to forearm and wrist lesions that had been abraded and skin not intact.  Discussed keep covered when at work and shower after work   Patient verbalized understanding, agreed with plan of care and had no further questions at this time.  Patient A&Ox3 respirations even and unlabored RA spoke full sentences without difficulty gait sure and steady  Patient denied sleeping with window open or activities in yard/park/grass/n/v/d/f/c

## 2024-09-06 ENCOUNTER — Ambulatory Visit: Payer: Self-pay

## 2024-09-06 ENCOUNTER — Ambulatory Visit

## 2024-09-06 VITALS — BP 161/83 | HR 99 | Temp 98.6°F | Ht 64.0 in | Wt 203.5 lb

## 2024-09-06 DIAGNOSIS — M5442 Lumbago with sciatica, left side: Secondary | ICD-10-CM | POA: Diagnosis not present

## 2024-09-06 DIAGNOSIS — E1165 Type 2 diabetes mellitus with hyperglycemia: Secondary | ICD-10-CM

## 2024-09-06 DIAGNOSIS — Z794 Long term (current) use of insulin: Secondary | ICD-10-CM | POA: Diagnosis not present

## 2024-09-06 MED ORDER — METHYLPREDNISOLONE 4 MG PO TBPK: ORAL_TABLET | ORAL | 0 refills | Status: DC

## 2024-09-06 MED ORDER — CYCLOBENZAPRINE HCL 5 MG PO TABS: 5.0000 mg | ORAL_TABLET | Freq: Three times a day (TID) | ORAL | 0 refills | Status: AC | PRN

## 2024-09-06 NOTE — Telephone Encounter (Signed)
 FYI Only or Action Required?: Action required by provider: request for appointment.  Patient was last seen in primary care on 01/03/2024 by Gretta Comer POUR, NP.  Called Nurse Triage reporting Back Pain.  Symptoms began yesterday.  Interventions attempted: Nothing.  Symptoms are: unchanged.  Triage Disposition: See HCP Within 4 Hours (Or PCP Triage)  Patient/caregiver understands and will follow disposition?: Yes     Copied from CRM #[id number]. Topic: Clinical - Red Word Triage >> Sep 06, 2024  8:07 AM Pinkey ORN wrote: Red Word that prompted transfer to Nurse Triage: Extreme Intense Pain >> Sep 06, 2024  8:09 AM Pinkey ORN wrote: Patient states she's experiencing pain on her left side and down the middle of her back.   Reason for Disposition  [1] SEVERE back pain (e.g., excruciating, unable to do any normal activities) AND [2] not improved 2 hours after pain medicine  Answer Assessment - Initial Assessment Questions 1. ONSET: When did the pain begin? (e.g., minutes, hours, days)     yesterday 2. LOCATION: Where does it hurt? (upper, mid or lower back)     Left lower back 3. SEVERITY: How bad is the pain?  (e.g., Scale 1-10; mild, moderate, or severe)     8-9 4. PATTERN: Is the pain constant? (e.g., yes, no; constant, intermittent)      constant 5. RADIATION: Does the pain shoot into your legs or somewhere else?     Left leg 6. CAUSE:  What do you think is causing the back pain?      sciatica 7. BACK OVERUSE:  Any recent lifting of heavy objects, strenuous work or exercise?     no 8. MEDICINES: What have you taken so far for the pain? (e.g., nothing, acetaminophen , NSAIDS)     OTC 9. NEUROLOGIC SYMPTOMS: Do you have any weakness, numbness, or problems with bowel/bladder control?     no 10. OTHER SYMPTOMS: Do you have any other symptoms? (e.g., fever, abdomen pain, burning with urination, blood in urine)       NO 11. PREGNANCY: Is there any chance  you are pregnant? When was your last menstrual period?       NO  Protocols used: Back Pain-A-AH

## 2024-09-06 NOTE — Progress Notes (Signed)
 Acute visit   Patient: [name]   DOB: May 11, 1981   43 y.o. Female  MRN: [id number] PCP: Gretta Comer POUR, NP   Chief Complaint  Patient presents with   Back Pain    X 1 day on left side to middle of back. Patient reports that she went to the chiropractor for sciatica and whatever adjustments they did worsened the pain. Pain is now moving to middle back and patient states it hurts ot take deep breaths    Subjective    Discussed the use of AI scribe software for clinical note transcription with the patient, who gave verbal consent to proceed.  History of Present Illness [name] is a 43 year old female with hx of type 2 diabetes who presents with worsening back pain and sciatica.  She initially sought chiropractic care for sciatica, which did not alleviate her symptoms. Instead, she developed new pain in the middle back after using a roller table at the chiropractor's office.   The sciatica pain, described as 'really bad', radiates from her left buttock down to the middle of her left leg. The current episode of sciatica has persisted for seven weeks without improvement and is worsening. She has experienced sciatica once before and was treated in the hospital with anti-inflammatory and pain medications, which eventually resolved the symptoms. However, this episode has not responded to similar interventions at home.  She uses a glucose monitor (Dexcom) and has both short-acting and long-acting insulin  at home. She administers 20 to 25 units of short-acting insulin  with each meal, adjusting the dose based on her blood sugar levels. Blood sugar has remained <200 most of the time. She also uses Trulicity  and metformin  as part of her diabetes management.  She reports significant sleep disruption due to the back pain, stating she has not slept more than two to three hours per night for over a week.  Review of systems as noted in HPI.   Objective    BP (!) 161/83 (BP  Location: Left Arm, Patient Position: Sitting, Cuff Size: Normal)   Pulse 99   Temp 98.6 F (37 C) (Oral)   Ht 5' 4 (1.626 m)   Wt 203 lb 8 oz (92.3 kg)   LMP 08/23/2024 (Approximate)   SpO2 100%   BMI 34.93 kg/m  Physical Exam Constitutional:      Appearance: Normal appearance.  HENT:     Head: Normocephalic and atraumatic.     Mouth/Throat:     Mouth: Mucous membranes are moist.  Eyes:     Pupils: Pupils are equal, round, and reactive to light.  Pulmonary:     Effort: Pulmonary effort is normal.  Musculoskeletal:     Thoracic back: Spasms and tenderness present. No bony tenderness.     Lumbar back: Spasms and tenderness present. No bony tenderness. Positive left straight leg raise test. Negative right straight leg raise test.     Comments: Patient with paraspinal muscle tenderness and spasms present at thoracic and lumbar spine. No bony tenderness present.  Skin:    General: Skin is warm.  Neurological:     General: No focal deficit present.     Mental Status: She is alert.       No results found for any visits on 09/06/24.  Assessment & Plan     Problem List Items Addressed This Visit       Endocrine   Type 2 diabetes mellitus with hyperglycemia (HCC)  Nervous and Auditory   Acute left-sided low back pain with left-sided sciatica - Primary   Relevant Medications   methylPREDNISolone  (MEDROL  DOSEPAK) 4 MG TBPK tablet   cyclobenzaprine (FLEXERIL) 5 MG tablet   Assessment & Plan Acute low back pain with left-sided sciatica Chronic sciatica with acute exacerbation and new mid-back pain post-chiropractic treatment. Severe pain impacting sleep and activities. On exam, no bony tenderness present, but does have muscle spasms and paraspinal muscle tenderness at thoracic and lumbar spine. Will treat with conservative measures as below. If no improvement, consider MRI imaging. - Prescribed Medrol  Dosepak - Prescribed Flexeril up to three times daily for spasms -  Advised increasing mealtime insulin  to at least 30 units and watching sugars closely with Dexcom. - Discussed potential physical therapy referral, will consider this - Provided work note for absence until Monday.  Type 2 diabetes mellitus Last A1c of 8.2. Current regimen includes long acting and short acting insulin , Trulicity , and metformin . Steroid treatment for sciatica will elevate glucose. Instructed on close monitoring and insulin  adjustment. - Monitor blood glucose closely, especially during steroid use. - Adjust mealtime insulin  to 30 units, with further increase as needed. - Continue long acting inuslin, Trulicity  and metformin . - Follow up with PCP   Meds ordered this encounter  Medications   methylPREDNISolone  (MEDROL  DOSEPAK) 4 MG TBPK tablet    Sig: Please take steroids as prescribed on package instructions.    Dispense:  21 each    Refill:  0   cyclobenzaprine (FLEXERIL) 5 MG tablet    Sig: Take 1 tablet (5 mg total) by mouth 3 (three) times daily as needed for muscle spasms.    Dispense:  30 tablet    Refill:  0     No follow-ups on file.      Isaiah DELENA Pepper, MD  [facility] [phone] (phone) [phone] (fax)

## 2024-09-06 NOTE — Telephone Encounter (Signed)
 Noted and appreciate Dr. Debi evaluation.

## 2024-09-11 ENCOUNTER — Ambulatory Visit: Payer: Self-pay

## 2024-09-11 ENCOUNTER — Telehealth: Payer: Self-pay | Admitting: Registered Nurse

## 2024-09-11 ENCOUNTER — Ambulatory Visit

## 2024-09-11 ENCOUNTER — Ambulatory Visit: Admitting: General Practice

## 2024-09-11 VITALS — BP 173/93 | HR 101 | Temp 98.6°F | Ht 64.0 in | Wt 203.5 lb

## 2024-09-11 DIAGNOSIS — Z794 Long term (current) use of insulin: Secondary | ICD-10-CM | POA: Diagnosis not present

## 2024-09-11 DIAGNOSIS — M5442 Lumbago with sciatica, left side: Secondary | ICD-10-CM

## 2024-09-11 DIAGNOSIS — G629 Polyneuropathy, unspecified: Secondary | ICD-10-CM

## 2024-09-11 DIAGNOSIS — E1142 Type 2 diabetes mellitus with diabetic polyneuropathy: Secondary | ICD-10-CM

## 2024-09-11 DIAGNOSIS — M5432 Sciatica, left side: Secondary | ICD-10-CM | POA: Diagnosis not present

## 2024-09-11 MED ORDER — NAPROXEN 500 MG PO TABS: 500.0000 mg | ORAL_TABLET | Freq: Two times a day (BID) | ORAL | 0 refills | Status: AC

## 2024-09-11 MED ORDER — GABAPENTIN 300 MG PO CAPS: 300.0000 mg | ORAL_CAPSULE | Freq: Every evening | ORAL | 3 refills | Status: AC

## 2024-09-11 NOTE — Progress Notes (Signed)
 EE presented at the clinic this morning with c/o Lt sciatica pain, starting in lower back and radiating down her Lt thigh. States she took her anti-inflammatory medicine already this morning. Also states she went to the chiropractor yesterday and has another appt tomorrow.  RN provided a Thermacare warm compress and instructed how to place it on her lower back. Instructed not to place right next to the skin and provided tape to adhere the compress to her outer underwear, under her pants. Discussed a follow up appt with her provider for better pain relief. Denied the need for VS to be taken.

## 2024-09-11 NOTE — Telephone Encounter (Signed)
 Noted, appreciate Dr. Debi evaluation.

## 2024-09-11 NOTE — Telephone Encounter (Signed)
 Patient came to clinic yesterday for flu vaccine but notified NP that she was on steroids.  Discussed she should wait at least a week before getting her vaccinations due to steroid use.  Patient did not discuss back pain at that time.  Steroids from her PCM per telephone discussion today started a week ago.  She is unsure what flared up her back pain 7/10 today during vacation . 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  Patient denied loss of bowel/bladder control, saddle paresthesias or arm/leg weakness.  She has tried oral steroid, nsaids, heat, stretches and chiropractor and it has not helped.  She has appt with PCM this afternoon.  Reminded patient I am not able to write work restriction notes in [facility] and she will need to obtain one from her PCM today if unable to climb ladders at work.  Recommended that she alternate heat/ice, continue stretches and keep appt with PCM this afternoon.  If new loss of bowel/bladder control, saddle paresthesias or arm/leg weakness seek same day re-evaluation with a provider.  Patient A&Ox3 verbalized understanding of information/instructions and had no further questions at this time.

## 2024-09-11 NOTE — Telephone Encounter (Signed)
 FYI Only or Action Required?: FYI only for provider.  Patient was last seen in primary care on 09/06/2024 by Franchot Isaiah LABOR, MD.  Called Nurse Triage reporting Leg Pain.  Symptoms began several months ago.  Interventions attempted: Prescription medications: Steroids and Flexeril.  Symptoms are: gradually worsening.  Triage Disposition: See HCP Within 4 Hours (Or PCP Triage)  Patient/caregiver understands and will follow disposition?: Yes       Copied from CRM #[id number]. Topic: Clinical - Red Word Triage >> Sep 11, 2024  8:47 AM Amy B wrote: Red Word that prompted transfer to Nurse Triage: Sciatic pain radiating down left leg Reason for Disposition  [1] SEVERE pain (e.g., excruciating, unable to do any normal activities) AND [2] not improved after 2 hours of pain medicine  Answer Assessment - Initial Assessment Questions Currently taking a medrol  dose pack and flexeril for symptoms. Patient states she has not had improvement with symptoms. Patient requesting to see same provider for symptoms she saw last week.     1. ONSET: When did the pain start?      7 weeks ago  2. LOCATION: Where is the pain located?      L side of lower back to middle part of her L lower leg  3. PAIN: How bad is the pain?    (Scale 1-10; or mild, moderate, severe)     7/10  4. WORK OR EXERCISE: Has there been any recent work or exercise that involved this part of the body?      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 CAUSE: What do you think is causing the leg pain?     Sciatic nerve pain  6. OTHER SYMPTOMS: Do you have any other symptoms? (e.g., chest pain, back pain, breathing difficulty, swelling, rash, fever, numbness, weakness)     Left side of lower back  Protocols used: Leg Pain-A-AH

## 2024-09-11 NOTE — Progress Notes (Signed)
 Acute visit   Patient: [name]   DOB: 03-Apr-1981   43 y.o. Female  MRN: [id number] PCP: Gretta Comer POUR, NP   Chief Complaint  Patient presents with   Acute Visit    Patient is present due to leg pain. RN Triage completed on 09/11/24. She reported she is currently taking a medrol  dose pack and flexeril for symptoms. Patient states she has not had improvement with symptoms. Patient requesting to see same provider for symptoms she saw last week. She reported pain started 7 weeks ago located on the left side of her lower back to middle part of her L lower leg. She reported pain as 7/10 and that she climbs a ladder all day at work. Reports numbness in both feet x 3 d   Leg Pain   Subjective    Discussed the use of AI scribe software for clinical note transcription with the patient, who gave verbal consent to proceed.  History of Present Illness [name] is a 43 year old female with diabetes who presents with back pain and neuropathy.  She has been experiencing significant back pain for approximately seven to eight weeks, described as excruciating, particularly when climbing ladders at work, which is a necessary part of her job. The pain improved after starting steroids, allowing her to sleep better, but it remains severe, especially with physical activity. She rates her current pain as a seven out of ten. She has not had an MRI of her spine and has never experienced back issues prior to this episode. Over-the-counter medications like naproxen have been tried, but she is cautious about overuse. A chiropractor appointment is scheduled, although previous visits have been uncomfortable due to the table used.  She reports tingling and numbness in her feet, which she associates with neuropathy. Her blood sugars have been elevated, running in the 250s since starting prednisone . She has increased her insulin  dosage by administering an extra shot of 28 units after meals. Despite this,  she continues to experience neuropathy symptoms, which she has not had in years. She previously discontinued gabapentin  when her neuropathy symptoms resolved.  Climbing ladders exacerbates her symptoms, causing her leg to feel heavy and as if she is dragging it. She has discussed with her workplace clinic the possibility of being placed on light duty or staying home, but they require a note from her medical provider. No fever, chills, loss of bowel or bladder function, or loss of sensation in the groin area.    Review of systems as noted in HPI.   Objective    BP (!) 173/93 (BP Location: Left Arm, Patient Position: Sitting, Cuff Size: Normal)   Pulse (!) 101   Temp 98.6 F (37 C) (Oral)   Ht 5' 4 (1.626 m)   Wt 203 lb 8 oz (92.3 kg)   LMP 08/23/2024 (Approximate)   SpO2 97%   BMI 34.93 kg/m  Physical Exam Constitutional:      Appearance: Normal appearance.  HENT:     Head: Normocephalic and atraumatic.     Mouth/Throat:     Mouth: Mucous membranes are moist.  Eyes:     Pupils: Pupils are equal, round, and reactive to light.  Pulmonary:     Effort: Pulmonary effort is normal.  Musculoskeletal:     Lumbar back: Spasms and tenderness present. No bony tenderness. Positive left straight leg raise test.  Skin:    General: Skin is warm.  Neurological:  General: No focal deficit present.     Mental Status: She is alert.       No results found for any visits on 09/11/24.  Assessment & Plan     Problem List Items Addressed This Visit       Endocrine   Type 2 diabetes mellitus with hyperglycemia (HCC)     Nervous and Auditory   Neuropathy - Primary   Relevant Medications   gabapentin  (NEURONTIN ) 300 MG capsule   Other Visit Diagnoses       Sciatica of left side       Relevant Medications   gabapentin  (NEURONTIN ) 300 MG capsule   naproxen (NAPROSYN) 500 MG tablet   Other Relevant Orders   MR LUMBAR SPINE WO CONTRAST       Assessment & Plan Acute  left-sided low back pain with left-sided sciatica Pain present >6 weeks. Pain somewhat improved with steroids but exacerbated by work activities. Symptoms consistent with sciatica. Long-term steroid use not advised due to side effects. Denies red flag signs. - Order MRI of the spine to rule out other causes. - Prescribe naproxen 500 mg twice daily for 7 days post-steroid. - Continue muscle relaxer as prescribed - Provide work note for light duty: no ladder climbing, no lifting over 10 pounds for one week. - Discussed physical therapy benefits; she declined referral.  Type 2 diabetes mellitus with diabetic polyneuropathy (HCC) Chronic, uncontrolled. Elevated blood sugars since prednisone  initiation, possibly worsening neuropathy. Gabapentin  previously discontinued due to symptom resolution. - Restart gabapentin  300 mg at bedtime. - Advised blood sugars should improve post-steroid course.   Meds ordered this encounter  Medications   gabapentin  (NEURONTIN ) 300 MG capsule    Sig: Take 1 capsule (300 mg total) by mouth at bedtime.    Dispense:  30 capsule    Refill:  3   naproxen (NAPROSYN) 500 MG tablet    Sig: Take 1 tablet (500 mg total) by mouth 2 (two) times daily with a meal for 7 days.    Dispense:  14 tablet    Refill:  0     No follow-ups on file.      Isaiah DELENA Pepper, MD  [facility] [phone] (phone) [phone] (fax)

## 2024-09-16 ENCOUNTER — Telehealth: Payer: Self-pay | Admitting: Registered Nurse

## 2024-09-16 ENCOUNTER — Ambulatory Visit: Admitting: General Practice

## 2024-09-16 ENCOUNTER — Other Ambulatory Visit: Payer: Self-pay | Admitting: General Practice

## 2024-09-16 ENCOUNTER — Other Ambulatory Visit: Payer: Self-pay | Admitting: Registered Nurse

## 2024-09-16 ENCOUNTER — Encounter: Payer: Self-pay | Admitting: Registered Nurse

## 2024-09-16 VITALS — BP 174/72 | HR 100 | Temp 98.6°F | Resp 16

## 2024-09-16 DIAGNOSIS — M549 Dorsalgia, unspecified: Secondary | ICD-10-CM

## 2024-09-16 DIAGNOSIS — E1165 Type 2 diabetes mellitus with hyperglycemia: Secondary | ICD-10-CM

## 2024-09-16 NOTE — Telephone Encounter (Signed)
 Patient into clinic today for mid back pain new started yesterday mid day and just not feeling well here for BP/temp check.  Feels like when I had pancreatitis  Having hot and cold spells.  Blood pressure continues elevated.  Had started medrol  dose pack for sciatica last week. Has had to add extra sliding scale insulin  to cover for elevated blood sugars.  Last appt with [facility] office 11 Sep 2024 for sciatica/low back pain.  Was given Rx for gabapentin , naproxen and MRI.  Patient stated still waiting for call back from RN for scheduling MRI.  Was given work excuse note no lifting greater than 10 lbs x 1 week and no ladder use.  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  See nurse visit note RN Karene Hopping.  POCT glucose x 1 now.  Discussed with RN draw executive panel with amylase and lipase today in clinic and notify PCM of patient complaints today. Secure message sent to Dr Franchot who had appt with patient 09/11/24.  Patient reported she has been taking her insulin  as prescribed with meals and at bedtime.  Denied abdomen pain or n/v at this time. ER precautions discussed with RN Karene and patient.  Reviewed letter in epic and work restrictions expire 09/18/24.  Patient vital signs stable from last week.  Patient agreed with plan of care and had no further questions at this time.  Exitcare handouts on back pain and pancreatitis sent to my chart.

## 2024-09-16 NOTE — Progress Notes (Signed)
 Employee presents to the clinic this am with c/o middle back pain, different than my sciatica pain, which runs down my leg. Pain is across my back and feels like it does when I've had pancreatitis before. Also states s/s of feeling hot and cold with chills, denies weakness , changes in taste or smell, nausea, vomiting, diarrhea or dizziness. States has taken all meds this am and continues to wear the continuous glucose monitor. Has taken a steroid dose pack,. completed this past week. Had not eaten and drank very little this morning.  See VS, elevated systolic B/P. CBG completed = 712. Upon RN assessment, employee's skin is cold & clammy but afebrile. No breakfast was eaten this am, offered Acetaminophen  but employee stated she had already taken that before coming to work.  RN consulted clinic NP, Ellouise Hope on further assessment. Per chart review and verbal orders provided, labs of Executive panel (CMP, LP, TP, TSH, CBC w/diff/Plts), Amylase/Lipase and Hgb A1C drawn. Last A1C completed 8 months ago. Will provide note and lab results to PCP for updates. Will follow up with employee with results as well.

## 2024-09-17 ENCOUNTER — Ambulatory Visit: Payer: Self-pay | Admitting: Registered Nurse

## 2024-09-17 DIAGNOSIS — E781 Pure hyperglyceridemia: Secondary | ICD-10-CM

## 2024-09-17 DIAGNOSIS — E1165 Type 2 diabetes mellitus with hyperglycemia: Secondary | ICD-10-CM

## 2024-09-17 LAB — CMP12+LP+TP+TSH+6AC+CBC/D/PLT
ALT: 14 IU/L (ref 0–32)
AST: 15 IU/L (ref 0–40)
Albumin: 4.3 g/dL (ref 3.9–4.9)
Alkaline Phosphatase: 61 IU/L (ref 41–116)
BUN/Creatinine Ratio: 17 (ref 9–23)
BUN: 10 mg/dL (ref 6–24)
Basophils Absolute: 0.1 x10E3/uL (ref 0.0–0.2)
Basos: 1 %
Bilirubin Total: <0.2 mg/dL (ref 0.0–1.2)
Calcium: 8.6 mg/dL — ABNORMAL LOW (ref 8.7–10.2)
Chloride: 95 mmol/L — ABNORMAL LOW (ref 96–106)
Chol/HDL Ratio: 87.4 ratio — ABNORMAL HIGH (ref 0.0–4.4)
Cholesterol, Total: 874 mg/dL (ref 100–199)
Creatinine, Ser: 0.6 mg/dL (ref 0.57–1.00)
EOS (ABSOLUTE): 0.1 x10E3/uL (ref 0.0–0.4)
Eos: 1 %
Estimated CHD Risk: >3 times avg. — ABNORMAL HIGH (ref 0.0–1.0)
Free Thyroxine Index: 2.1 (ref 1.2–4.9)
GGT: 20 IU/L (ref 0–60)
Globulin, Total: 1.1 g/dL — ABNORMAL LOW (ref 1.5–4.5)
Glucose: 217 mg/dL — ABNORMAL HIGH (ref 70–99)
HDL: 10 mg/dL — ABNORMAL LOW (ref >39)
Hematocrit: 42.1 % (ref 34.0–46.6)
Hemoglobin: 15.3 g/dL (ref 11.1–15.9)
Immature Grans (Abs): 0.1 x10E3/uL (ref 0.0–0.1)
Immature Granulocytes: 2 %
Iron: 57 ug/dL (ref 27–159)
LDL Chol Calc (NIH): 679 mg/dL — ABNORMAL HIGH (ref 0–99)
Lymphocytes Absolute: 2.1 x10E3/uL (ref 0.7–3.1)
Lymphs: 24 %
MCH: 32 pg (ref 26.6–33.0)
MCHC: 36.3 g/dL — ABNORMAL HIGH (ref 31.5–35.7)
MCV: 88 fL (ref 79–97)
Monocytes Absolute: 0.4 x10E3/uL (ref 0.1–0.9)
Monocytes: 5 %
Neutrophils Absolute: 6.1 x10E3/uL (ref 1.4–7.0)
Neutrophils: 67 %
Phosphorus: 3.4 mg/dL (ref 3.0–4.3)
Platelets: 344 x10E3/uL (ref 150–450)
Potassium: 3.9 mmol/L (ref 3.5–5.2)
RBC: 4.78 x10E6/uL (ref 3.77–5.28)
RDW: 14 % (ref 11.7–15.4)
Sodium: 127 mmol/L — ABNORMAL LOW (ref 134–144)
T3 Uptake Ratio: 29 % (ref 24–39)
T4, Total: 7.3 ug/dL (ref 4.5–12.0)
TSH: 2.22 u[IU]/mL (ref 0.450–4.500)
Total Protein: 5.4 g/dL — ABNORMAL LOW (ref 6.0–8.5)
Triglycerides: 454 mg/dL — ABNORMAL HIGH (ref 0–149)
Uric Acid: 6.1 mg/dL (ref 2.6–6.2)
VLDL Cholesterol Cal: 185 mg/dL — ABNORMAL HIGH (ref 5–40)
WBC: 8.9 x10E3/uL (ref 3.4–10.8)
eGFR: 113 mL/min/1.73 (ref >59)

## 2024-09-17 LAB — AMYLASE: Amylase: 25 U/L — ABNORMAL LOW (ref 31–110)

## 2024-09-17 LAB — LIPID PANEL WITH LDL/HDL RATIO: LDL/HDL Ratio: 67.9 ratio — ABNORMAL HIGH (ref 0.0–3.2)

## 2024-09-17 LAB — HGB A1C W/O EAG: Hgb A1c MFr Bld: 10 % — ABNORMAL HIGH (ref 4.8–5.6)

## 2024-09-17 LAB — COMPREHENSIVE METABOLIC PANEL WITH GFR: CO2: 21 mmol/L (ref 20–29)

## 2024-09-17 LAB — SPECIMEN STATUS REPORT

## 2024-09-18 NOTE — Telephone Encounter (Signed)
 See office visit note 09/17/24

## 2024-09-18 NOTE — Progress Notes (Signed)
 See office note dated 21 Oct 25 verbally discussed results with patient in clinic. Per patient continuous glucose monitor level 108 and highest reading for the day 182. App reviewed with patient. "I have been in the green the entire day today". Congratulated patient on her success discussed due to steroids last week blood pressure and blood sugars elevated further than normal. "I didn't want to be on them but the back pain was bad". "Pancreatitis in the past always started with back pain not stomach pain". She stated she has been on salads the past couple of days. Typically eats easily reheatable foods from grocery store eg salads, pasta or chicken wings. Stated did not eat anything for breakfast today. Pasta her favorite meal typically eats more than one serving per meal. Has been drinking water today "I generally don't do any cooking of food". Patient at work denied new or worsening symptoms stated back pain improved VSS. Abd SND/NT normative bowel sounds. Stated diarrhea last week now resolved denied blood red or black in stool. Discussed elevated blood sugar and diarrhea can cause electrolyte loss. Avoid sodas and lemonade Recommended eating soups with clear broths this week, low or no sugar Gatorade/powerade and potassium rich foods like potatoes, bananas.  Patient stated in the past she sometimes required salt tablets. Discussed with patient sometimes diarrhea can be a symptom of high blood sugars also. Check her blood sugar avoid added sugars/juices/high carb diet eg pasta/bread large portions or foods with high fructose corn syrup.  Stated she would schedule PCM appt through my chart app and check her medication at home to ensure sufficient supply and if not enough supply she will request refills. Discussed if less than a couple days supply to notify RN Karene or I this week. Recommended she follow high triglyceride diet recommendations from exitcare handout. Patient stated she can tell when triglycerides  high as she develops bumps on upper arms patient pushed up sleeves and flesh colored papules warm dry noted scattered on upper arms. discussed with patient elevated blood sugars/cholesterol put her at risk for organ damage, heart attack and stroke. Recommended follow up with free med[facility] dietitian. Dietitian contact information handout printed given to patient  discussed I would follow up with her again in 48 hours and she should see RN Karene tomorrow if new concerns or worsening symptoms. Encouraged patient to take all her medication as prescribed daily and prn if needed/indicated. Patient asked if she could get vaccines today discussed wait until next week as patient reported last steroid dose this past Sunday 19 October. Patient agreed with plan of care and had no further questions at this time

## 2024-09-18 NOTE — Telephone Encounter (Signed)
 Patient seen in clinic 09/17/24 rash resolved on elbow and forearms.

## 2024-09-18 NOTE — Telephone Encounter (Signed)
 Last steroid dose 09/15/24 patient instructed to see RN Karene next week due to steroid use can decrease immunity response to vaccine if given this week.  Patient agreed with plan of care and had no further questions at this time.

## 2024-09-24 ENCOUNTER — Other Ambulatory Visit: Payer: Self-pay | Admitting: General Practice

## 2024-09-27 NOTE — Telephone Encounter (Signed)
 Message left for patient reminder schedule appt with [facility] for follow up as PCM notified me appt still needed 09/26/24

## 2024-10-03 ENCOUNTER — Encounter: Payer: Self-pay | Admitting: Registered Nurse

## 2024-10-03 ENCOUNTER — Telehealth: Payer: Self-pay | Admitting: Registered Nurse

## 2024-10-03 ENCOUNTER — Ambulatory Visit: Admitting: Registered Nurse

## 2024-10-03 ENCOUNTER — Ambulatory Visit: Admitting: General Practice

## 2024-10-03 VITALS — BP 155/88 | HR 78 | Temp 98.4°F | Resp 16 | Wt 203.0 lb

## 2024-10-03 DIAGNOSIS — E1165 Type 2 diabetes mellitus with hyperglycemia: Secondary | ICD-10-CM

## 2024-10-03 DIAGNOSIS — E119 Type 2 diabetes mellitus without complications: Secondary | ICD-10-CM

## 2024-10-03 NOTE — Progress Notes (Signed)
 Presents to the clinic for NP assessment; Continuous Glucose Monitor reading 201 this am.

## 2024-10-03 NOTE — Patient Instructions (Signed)
 Diabetes Mellitus and Exercise Regular exercise is important for your health, especially if you have diabetes mellitus. Exercise is not just about losing weight. It can also help you increase muscle strength and bone density and reduce body fat and stress. This can help your level of endurance and make you more fit and flexible. Why should I exercise if I have diabetes? Exercise has many benefits for people with diabetes. It can: Help lower and control your blood sugar (glucose). Help your body respond better and become more sensitive to the hormone insulin . Reduce how much insulin  your body needs. Lower your risk for heart disease by: Lowering how much bad cholesterol and triglycerides you have in your body. Increasing how much good cholesterol you have in your body. Lowering your blood pressure. Lowering your blood glucose levels. What is my activity plan? Your health care provider or an expert trained in diabetes care (certified diabetes educator) can help you make an activity plan. This plan can help you find the type of exercise that works for you. It may also tell you how often to exercise and for how long. Be sure to: Get at least 150 minutes of medium-intensity or high-intensity exercise each week. This may involve brisk walking, biking, or water aerobics. Do stretching and strengthening exercises at least 2 times a week. This may involve yoga or weight lifting. Spread out your activity over at least 3 days of the week. Get some form of physical activity each day. Do not go more than 2 days in a row without some kind of activity. Avoid being inactive for more than 30 minutes at a time. Take frequent breaks to walk or stretch. Choose activities that you enjoy. Set goals that you know you can accomplish. Start slowly and increase the intensity of your exercise over time. How do I manage my diabetes during exercise?  Monitor your blood glucose Check your blood glucose before and  after you exercise. If your blood glucose is 240 mg/dL (86.6 mmol/L) or higher before you exercise, check your urine for ketones. These are chemicals created by the liver. If you have ketones in your urine, do not exercise until your blood glucose returns to normal. If your blood glucose is 100 mg/dL (5.6 mmol/L) or lower, eat a snack that has 15-20 grams of carbohydrate in it. Check your blood glucose 15 minutes after the snack to make sure that your level is above 100 mg/dL (5.6 mmol/L) before you start to exercise. Your risk for low blood glucose (hypoglycemia) goes up during and after exercise. Know the symptoms of this condition and how to treat it. Follow these instructions at home: Keep a carbohydrate snack on hand for use before, during, and after exercise. This can help prevent or treat hypoglycemia. Avoid injecting insulin  into parts of your body that are going to be used during exercise. This may include: Your arms, when you are going to play tennis. Your legs, when you are about to go jogging. Keep track of your exercise habits. This can help you and your health care provider watch and adjust your activity plan. Write down: What you eat before and after you exercise. Blood glucose levels before and after you exercise. The type and amount of exercise you do. Talk to your health care provider before you start a new activity. They may need to: Make sure that the activity is safe for you. Adjust your insulin , other medicines, and food that you eat. Drink water while you exercise. This can  stop you from losing too much water (dehydration). It can also prevent problems caused by having a lot of heat in your body (heat stroke). Where to find more information American Diabetes Association: diabetes.org Association of Diabetes Care & Education Specialists: diabeteseducator.org This information is not intended to replace advice given to you by your health care provider. Make sure you discuss  any questions you have with your health care provider. Document Revised: 05/04/2022 Document Reviewed: 05/04/2022 Elsevier Patient Education  2024 Elsevier Inc.Diabetes: Healthy Eating for Adults When you have diabetes, also called diabetes mellitus, it's important to have healthy eating habits. Your blood sugar (glucose) levels are greatly affected by what you eat and drink. You need to eat healthy foods in the right amounts, at about the same times each day. Doing this can help you: Manage your blood sugar. Lower your risk of heart disease. Improve your blood pressure. Reach or stay at a healthy weight. What can affect my meal plan? Every person with diabetes is different. And each person has different needs for a meal plan. Your health care provider may suggest that you work with an expert in healthy eating called a dietitian. They can help you make a meal plan that's best for you. How do carbohydrates affect me? Carbohydrates, also called carbs, affect your blood sugar level more than any other type of food. Eating carbs raises the amount of sugar in your blood. It's important to know how many carbs you can safely have in each meal. This is different for every person. Your dietitian can help you calculate how many carbs you should have at each meal and for each snack. How does alcohol affect me? Alcohol can cause a decrease in blood sugar (hypoglycemia), especially if you use insulin  or take certain diabetes medicines by mouth. Hypoglycemia can be a life-threatening condition. Symptoms of hypoglycemia are similar to those of having too much alcohol. They include confusion, being sleepy, and feeling dizzy. Do not drink alcohol if: Your provider tells you not to drink. You're pregnant, may be pregnant, or plan to become pregnant. What are tips for following this plan? Reading food labels Start by checking the serving size on the Nutrition Facts label of packaged foods and drinks. The number of  calories and the amount of carbs, fats, and other nutrients listed on the label are based on one serving of the item. Many items contain more than one serving per package. Check the total grams (g) of carbs in one serving. Check the number of grams of saturated fats and trans fats in one serving. Choose foods that have a low amount or none of these fats. Check the number of milligrams (mg) of salt (sodium) in one serving. Most people should limit their total sodium intake to less than 2,300 mg per day. Always check the nutrition information of foods labeled as low-fat or nonfat. These foods may be higher in added sugar or refined carbs and should be avoided. Talk to your dietitian to identify your daily goals for nutrients listed on the label. Shopping Avoid buying canned, pre-made, or processed foods. These foods tend to be high in fat, sodium, and added sugar. Shop around the outside edge of the grocery store. This is where you'll most often find fresh fruits and vegetables, bulk grains, fresh meats, and fresh dairy products. Cooking Use low-heat cooking methods, such as baking, instead of high-heat methods like deep frying. Cook using healthy oils, such as olive, canola, or sunflower oil. Avoid cooking with  butter, cream, or high-fat meats. Meal planning  Eat meals and snacks regularly. Try to eat them at the same times every day. Avoid going too long without eating. Eat foods that are high in fiber, such as fresh fruits, vegetables, beans, and whole grains. Eat 4-6 oz (112-168 g) of lean protein each day, such as lean meat, chicken, fish, eggs, or tofu. One ounce (oz) (28 g) of lean protein is equal to: 1 oz (28 g) of meat, chicken, or fish. 1 egg.  cup (62 g) of tofu. Eat some foods each day that contain healthy fats, such as avocado, nuts, seeds, and fish. What foods should I eat? Fruits Berries. Apples. Oranges. Peaches. Apricots. Plums. Grapes. Mangoes. Papayas. Pomegranates.  Kiwi. Cherries. Vegetables Leafy greens, including lettuce, spinach, kale, chard, collard greens, mustard greens, and cabbage. Beets. Cauliflower. Broccoli. Carrots. Green beans. Tomatoes. Peppers. Onions. Cucumbers. Brussels sprouts. Grains Whole grains, such as whole-wheat or whole-grain bread, crackers, tortillas, cereal, and pasta. Unsweetened oatmeal. Quinoa. Brown or wild rice. Meats and other proteins Seafood. Poultry without skin. Lean cuts of poultry and beef. Tofu. Nuts. Seeds. Dairy Low-fat or fat-free dairy products such as milk, yogurt, and cheese. The items listed above may not be all the foods and drinks you can have. Talk with a dietitian to learn more. What foods should I avoid? Fruits Fruits canned with syrup. Vegetables Canned vegetables. Frozen vegetables with butter or cream sauce. Grains Refined white flour and flour products such as bread, pasta, snack foods, and cereals. Avoid all processed foods. Meats and other proteins Fatty cuts of meat. Poultry with skin. Breaded or fried meats. Processed meat. Avoid saturated fats. Dairy Full-fat yogurt, cheese, or milk. Beverages Sweetened drinks, such as soda or iced tea. The items listed above may not be all the foods and drinks you should avoid. Talk with a dietitian to learn more. Where to find more information: To learn more, go to: Academy of Nutrition and Dietetics at deathprevention.it. Click Search and type diabetes. Find the link you need. Centers for Disease Control and Prevention at tonerpromos.no. Click Search and type diabetes. Find the link you need. American Diabetes Association: diabetes.org/food-nutrition [employer] of Diabetes and Digestive and Kidney Diseases: stagesync.si This information is not intended to replace advice given to you by your health care provider. Make sure you discuss any questions you have with your health care provider. Document Revised: 11/02/2023 Document Reviewed:  11/02/2023 Elsevier Patient Education  2025 [employer].

## 2024-10-03 NOTE — Progress Notes (Signed)
 Established Patient Office Visit  Subjective   Patient ID: [name], female    DOB: [birth year]  Age: 43 y.o. MRN: [id number]  Chief Complaint  Patient presents with   paperwork for diabetes sensors   Diabetes    43y/o caucasian female established patient here for assistance with filling out paperwork for new continuous glucose monitoring sensor and transmitter.  Needs to be replaced annually.  Provider [facility] [address] [place] phone [phone] Fax [phone].  Unable to have appt with PCM until January 2026.  Last appt with [facility] office Dr Franchot Oct 2025.  PCM Clark requested patient to schedule with her.  Past 30 days continuous glucose monitor has showed time in range 19%; past day 10% and past week 16%; current reading 204  Still having occasional diarrhea brown loose/watery; abdomen pain has resolved Using lantus  insulin  60units BID 100u/ml and regular insulin  25-50 units before meals depending on what she is going to eat and current blood sugar level.  Still taking trulicity  and metformin  also  Patient has been trying to adjust her diet.  Has noticed blood sugars increasing in am as soon as she wakes up and continues to increase even through she doesn't eat anything right away.  Denied N/V/F/C  Seeing her chiropractor for back pain and it is much improved.  Denied feelings of abdomen or back pain like she had with previous pancreatitis      Review of Systems  Constitutional:  Negative for chills, diaphoresis, fever and malaise/fatigue.  HENT:  Negative for congestion and nosebleeds.   Eyes:  Negative for blurred vision, double vision and photophobia.  Respiratory:  Negative for cough.   Gastrointestinal:  Positive for diarrhea. Negative for abdominal pain, blood in stool, constipation, heartburn, melena, nausea and vomiting.  Genitourinary:  Negative for dysuria.  Skin:  Negative for rash.  Neurological:  Negative for dizziness  and headaches.      Objective:     BP (!) 155/88 (BP Location: Left Arm, Patient Position: Sitting, Cuff Size: Normal)   Pulse 78   Temp 98.4 F (36.9 C) (Tympanic)   Resp 16   Wt 203 lb (92.1 kg)   LMP 08/23/2024 (Approximate)   SpO2 98%   BMI 34.84 kg/m    Physical Exam Vitals and nursing note reviewed.  Constitutional:      General: She is awake. She is not in acute distress.    Appearance: Normal appearance. She is well-developed and well-groomed. She is obese. She is not ill-appearing, toxic-appearing or diaphoretic.  HENT:     Head: Normocephalic and atraumatic.     Jaw: There is normal jaw occlusion.     Salivary Glands: Right salivary gland is not diffusely enlarged or tender. Left salivary gland is not diffusely enlarged or tender.     Right Ear: Hearing and external ear normal. No decreased hearing noted. No laceration or drainage.     Left Ear: Hearing and external ear normal. No decreased hearing noted. No laceration or drainage.     Nose: Nose normal. No congestion or rhinorrhea.     Right Nostril: No epistaxis.     Left Nostril: No epistaxis.     Right Turbinates: Not enlarged, swollen or pale.     Left Turbinates: Not enlarged, swollen or pale.     Right Sinus: No maxillary sinus tenderness or frontal sinus tenderness.     Left Sinus: No maxillary sinus tenderness or frontal sinus tenderness.  Mouth/Throat:     Lips: Pink. No lesions.     Mouth: Mucous membranes are moist. No oral lesions or angioedema.     Dentition: No gum lesions.     Tongue: No lesions. Tongue does not deviate from midline.     Palate: No mass and lesions.     Pharynx: Oropharynx is clear. Uvula midline.     Tonsils: No tonsillar exudate.  Eyes:     General: Lids are normal. Vision grossly intact. Gaze aligned appropriately. Allergic shiner present. No scleral icterus.       Right eye: No discharge.        Left eye: No discharge.     Extraocular Movements: Extraocular movements  intact.     Conjunctiva/sclera: Conjunctivae normal.     Pupils: Pupils are equal, round, and reactive to light.  Neck:     Trachea: Trachea normal.  Cardiovascular:     Rate and Rhythm: Normal rate and regular rhythm.     Pulses: Normal pulses.          Radial pulses are 2+ on the right side and 2+ on the left side.     Heart sounds: Normal heart sounds.  Pulmonary:     Effort: Pulmonary effort is normal. No respiratory distress.     Breath sounds: Normal breath sounds and air entry. No stridor, decreased air movement or transmitted upper airway sounds. No decreased breath sounds, wheezing, rhonchi or rales.     Comments: Spoke full sentences without difficulty; no cough observed in exam room; BBS CTA Abdominal:     General: Abdomen is flat. There is no distension.     Palpations: Abdomen is soft. There is no mass.     Tenderness: There is no abdominal tenderness. There is no guarding or rebound.     Hernia: No hernia is present.  Musculoskeletal:        General: Normal range of motion.     Right hand: Normal strength. Normal capillary refill.     Left hand: Normal strength. Normal capillary refill.     Cervical back: Normal range of motion and neck supple. No swelling, edema, deformity, erythema, signs of trauma, lacerations, rigidity, spasms, torticollis, tenderness or crepitus. No pain with movement. Normal range of motion.     Thoracic back: No swelling, edema, deformity, signs of trauma, lacerations, spasms or tenderness. Normal range of motion.     Lumbar back: No swelling, edema, deformity, signs of trauma or lacerations. Normal range of motion.     Right lower leg: No edema.     Left lower leg: No edema.     Comments: On/off exam table without difficulty standing to sitting to supine and reversed quickly without assistance  Lymphadenopathy:     Head:     Right side of head: No submental, submandibular, tonsillar, preauricular, posterior auricular or occipital adenopathy.      Left side of head: No submental, submandibular, tonsillar, preauricular, posterior auricular or occipital adenopathy.     Cervical: No cervical adenopathy.     Right cervical: No superficial, deep or posterior cervical adenopathy.    Left cervical: No superficial, deep or posterior cervical adenopathy.  Skin:    General: Skin is warm and dry.     Capillary Refill: Capillary refill takes less than 2 seconds.     Coloration: Skin is not ashen, cyanotic, jaundiced, mottled, pale or sallow.     Findings: No abrasion, abscess, acne, bruising, burn, ecchymosis, erythema, signs of injury,  laceration, lesion, petechiae, rash or wound.     Nails: There is no clubbing.     Comments: Anterior face/neck/hands visually inspected  Neurological:     General: No focal deficit present.     Mental Status: She is alert and oriented to person, place, and time. Mental status is at baseline.     GCS: GCS eye subscore is 4. GCS verbal subscore is 5. GCS motor subscore is 6.     Cranial Nerves: No cranial nerve deficit, dysarthria or facial asymmetry.     Sensory: Sensation is intact.     Motor: Motor function is intact. No weakness, tremor, atrophy, abnormal muscle tone or seizure activity.     Coordination: Coordination is intact. Coordination normal.     Gait: Gait is intact. Gait normal.     Comments: In/out of chair and on/off exam table without difficulty; gait sure and steady in clinic; bilateral hand grasp equal 5/5  Psychiatric:        Attention and Perception: Attention and perception normal.        Mood and Affect: Mood and affect normal.        Speech: Speech normal.        Behavior: Behavior normal. Behavior is cooperative.        Thought Content: Thought content normal.        Cognition and Memory: Cognition and memory normal.        Judgment: Judgment normal.      No results found for any visits on 10/03/24.    The ASCVD Risk score (Arnett DK, et al., 2019) failed to calculate for the  following reasons:   The valid HDL cholesterol range is 20 to 100 mg/dL   The valid total cholesterol range is 130 to 320 mg/dL     Latest Reference Range & Units 01/03/24 11:40 09/16/24 00:00  Comprehensive metabolic panel with GFR  Rpt ! Rpt  Sodium 134 - 144 mmol/L 138 127 (L)  Potassium 3.5 - 5.2 mmol/L 4.5 3.9  Chloride 96 - 106 mmol/L 104 95 (L)  CO2 20 - 29 mmol/L 25 21  Glucose 70 - 99 mg/dL 784 (H) 782 (H)  BUN 6 - 24 mg/dL 11 10  Creatinine 9.42 - 1.00 mg/dL 9.56 9.39  Calcium  8.7 - 10.2 mg/dL 8.8 8.6 (L)  BUN/Creatinine Ratio 9 - 23   17  eGFR >59 mL/min/1.73  113  Phosphorus 3.0 - 4.3 mg/dL  3.4  Alkaline Phosphatase 41 - 116 IU/L 46 61  Albumin 3.9 - 4.9 g/dL 4.1 4.3  Uric Acid 2.6 - 6.2 mg/dL  6.1  Amylase, Serum 31 - 110 U/L  25 (L)  AST 0 - 40 IU/L 11 15  ALT 0 - 32 IU/L 11 14  Total Protein 6.0 - 8.5 g/dL 6.8 5.4 (L)  Total Bilirubin 0.0 - 1.2 mg/dL 0.5 <9.7  GGT 0 - 60 IU/L  20  GFR >60.00 mL/min 118.64   Estimated CHD Risk 0.0 - 1.0 times avg.   > 3.0 (H)  LDH IU/L  CANCELED  Total CHOL/HDL Ratio 0.0 - 4.4 ratio 6 87.4 (H)  Cholesterol 0 - 200 mg/dL 783 (H)   Cholesterol, Total 100 - 199 mg/dL  125 (HH)  HDL Cholesterol >39 mg/dL 64.09 (L) 10 (L)  Direct LDL mg/dL 48.9   LDL/HDL Ratio 0.0 - 3.2 ratio  67.9 (H)  NonHDL  180.06   Triglycerides 0 - 149 mg/dL 8915.9 Triglyceride is over 400; calculations on  Lipids are invalid. (H) 454 (H)  VLDL 0.0 - 40.0 mg/dL 783.1 (H)   VLDL Cholesterol Cal 5 - 40 mg/dL  814 (H)  LDL Chol Calc (NIH) 0 - 99 mg/dL  320 (H)  LDL CALC COMMENT:   Comment  Iron 27 - 159 ug/dL  57  VITD 69.99 - 899.99 ng/mL <7.00 (L)   Globulin, Total 1.5 - 4.5 g/dL  1.1 (L)  WBC 3.4 - 89.1 x10E3/uL  8.9  RBC 3.77 - 5.28 x10E6/uL  4.78  Hemoglobin 11.1 - 15.9 g/dL  84.6  HCT 65.9 - 53.3 %  42.1  MCV 79 - 97 fL  88  MCH 26.6 - 33.0 pg  32.0  MCHC 31.5 - 35.7 g/dL  63.6 (H)  RDW 88.2 - 84.5 %  14.0  Platelets 150 - 450 x10E3/uL  344   Neutrophils Not Estab. %  67  Immature Granulocytes Not Estab. %  2  NEUT# 1.4 - 7.0 x10E3/uL  6.1  Lymphs Abs 0.7 - 3.1 x10E3/uL  2.1  Monocytes Absolute 0.1 - 0.9 x10E3/uL  0.4  Basophils Absolute 0.0 - 0.2 x10E3/uL  0.1  Immature Grans (Abs) 0.0 - 0.1 x10E3/uL  0.1  Lymphs Not Estab. %  24  Monocytes Not Estab. %  5  Basos Not Estab. %  1  Eos Not Estab. %  1  EOS (ABSOLUTE) 0.0 - 0.4 x10E3/uL  0.1  Hemoglobin A1C 4.8 - 5.6 % 8.2 (H) 10.0 (H)  TSH 0.450 - 4.500 uIU/mL  2.220  Thyroxine (T4) 4.5 - 12.0 ug/dL  7.3  Free Thyroxine Index 1.2 - 4.9   2.1  T3 Uptake Ratio 24 - 39 %  29  (HH): Data is critically high !: Data is abnormal (L): Data is abnormally low (H): Data is abnormally high Rpt: View report in Results Review for more information Assessment & Plan:   Problem List Items Addressed This Visit       Endocrine   Type 2 diabetes mellitus with hyperglycemia (HCC) - Primary   Relevant Orders   Hgb A1c w/o eAG   Other Visit Diagnoses       Type 2 diabetes mellitus without complication, with long-term current use of insulin  (HCC)         Completed order for therapeutic implantable continuous glucose monitoring sensor and transmitter as current expires in November.  Discussed scheduling with [employer] dietitian given handout.  Attempted to schedule patient with [facility] and she refused at this time stated she would call office to schedule.  Discussed overnight oatmeal with nuts and low glycemic fruits.  Patient stated she has decreased pasta and bread intake.  She is drinking water.  Discussed moving body in am on awakening and after meals/snacks.  Continue insulin  lantus  60 units BID and sliding scale regular 25-30 units with meals SQ.  Discussed low glycemic fruit options berries raspberries, blackberries, strawberries, blueberries, tart cherries, Apples: Hulda Sharps, [place], [place], [place]: [place], [place]; Pears: Asian pear, [place];Apricots: Fresh, dried; Grapefruit:  White, pink, ruby Plums: Fresh, dried; Kiwis: Gold, green; Avocados: Although technically a fruit, avocados have a very low GI.  Discussed protein rich breakfast or meals when blood sugars elevated over 200 and decreasing starchy/carbohydrates.  Discussed dietitian can assist with meal planning and less cooking required foods  Discussed greek yogurt check for added sugars; nuts almonds, walnuts, pecans Patient to schedule her implant appt with [facility] phone [phone].  Follow up as needed with [facility] staff if new or  worsening symptoms.  Continue metformin  XR 1000mg  po BID and trulicity  3mg  weekly.  Next Hgba1c in 3 months.  Weight stable on weigh in today.  Patient reported did calibration with her meter on Sunday refused POCT glucose today with RN Karene.  Patient agreed with plan of care and had no further questions at this time.  Return in 3 months (on 01/03/2025), or if symptoms worsen or fail to improve, for schedule with PCM ASAP; [employer] dietitian; [place] [facility]; hgba1c 3 months.    Ellouise DELENA Hope, NP

## 2024-10-04 NOTE — Telephone Encounter (Signed)
 See office note 10/03/24 patient reported unable to see PCM until Jan 2026 and is considering switching PCM to Dr Franchot.

## 2024-10-08 NOTE — Telephone Encounter (Signed)
 Discussed with patient no follow up fax/call from provider.  She has not received any calls/emails from endocrinology/sensor POC yet either.  Discussed with patient to notify me if further needs identified.  Patient agreed with plan of care and had no further questions at this time.

## 2024-10-10 ENCOUNTER — Ambulatory Visit

## 2024-10-17 ENCOUNTER — Ambulatory Visit: Payer: Self-pay | Admitting: Registered Nurse

## 2024-10-17 DIAGNOSIS — E1165 Type 2 diabetes mellitus with hyperglycemia: Secondary | ICD-10-CM

## 2024-10-17 LAB — GLUCOSE, POCT (MANUAL RESULT ENTRY): POC Glucose: 224 mg/dL — AB (ref 70–99)

## 2024-10-17 NOTE — Telephone Encounter (Signed)
 Patient stated the sensor order forms needs to be refaxed as it was not received  All office visit notes received.  Her continuous glucose sensor quit working last week.  Discussed POCT glucose check today with RN Karene.   Results 224.  Patient following up with endocrinology office to get new sensor implanted as quickly as possible.  Patient has not yet scheduled appt with PCM.  Discussed needs to call office to schedule because the longer she waits the later in 2026 her next appt will be.  Stated she would this week for January appt.  Discussed with patient that [facility] office may have early or late appts available and to ask as she stated she hasn't called because she doesn't think any appts available outside of her working hours and she cannot take time off from work holiday black out period right now.  Patient may see RN Karene for POCT glucose checks at work M-Th 8a-5p.  Refaxed order while patient in clinic and transmission confirmation received and placed in her onsite paper record.  Patient verbalized understanding information/instructions and had no further questions at this time.  A&Ox3 gait sure and steady skin warm dry and pink respirations even and unlabored.

## 2024-10-30 NOTE — Telephone Encounter (Signed)
 Patient reported that her sensor was shipped she received email confirmation from manufacturer.  Has scheduled implant appt with her provider in [place].  Stated performing POC glucose checks and this am was 243 using her home machine but not bringing to work.  Taking her insulin  as scheduled but not checking blood sugar at work.  Discussed with patient she can see RN Karene for POC glucose testing between 8a-5p Monday-Thursday until her CGM is working again.  Patient stated she booked appt with new PCM Dr Franchot 01/02/24  A&Ox3 respirations even and unlabored spoke full sentences without difficulty gait sure and steady.  Patient agreed with plan of care and had no further questions at this time.

## 2024-11-14 NOTE — Telephone Encounter (Signed)
 Patient seen in workcenter reported appt for new sensor implant scheduled for tomorrow.  Denied questions or concerns at this time and feeling well.

## 2024-12-03 ENCOUNTER — Ambulatory Visit: Admitting: Primary Care

## 2024-12-24 NOTE — Telephone Encounter (Signed)
 Patient reported new monitor working well for her and denied questions or concerns.

## 2025-01-01 ENCOUNTER — Ambulatory Visit

## 2025-02-10 ENCOUNTER — Ambulatory Visit
